# Patient Record
Sex: Female | Born: 1935 | Race: White | Hispanic: No | Smoking: Never smoker
Health system: Southern US, Community
[De-identification: ages and names within clinical notes are randomized; demographics above are authoritative.]

## PROBLEM LIST (undated history)

## (undated) DIAGNOSIS — E119 Type 2 diabetes mellitus without complications: Secondary | ICD-10-CM

## (undated) DIAGNOSIS — S72009A Fracture of unspecified part of neck of unspecified femur, initial encounter for closed fracture: Secondary | ICD-10-CM

## (undated) DIAGNOSIS — G039 Meningitis, unspecified: Secondary | ICD-10-CM

## (undated) DIAGNOSIS — C50919 Malignant neoplasm of unspecified site of unspecified female breast: Secondary | ICD-10-CM

## (undated) HISTORY — PX: HIP CLOSED REDUCTION: SHX983

## (undated) HISTORY — DX: Fracture of unspecified part of neck of unspecified femur, initial encounter for closed fracture: S72.009A

## (undated) HISTORY — DX: Malignant neoplasm of unspecified site of unspecified female breast: C50.919

## (undated) HISTORY — DX: Type 2 diabetes mellitus without complications: E11.9

## (undated) HISTORY — DX: Meningitis, unspecified: G03.9

---

## 2008-03-01 ENCOUNTER — Ambulatory Visit: Admission: RE | Admit: 2008-03-01 | Discharge: 2008-05-13 | Payer: Self-pay | Admitting: Radiation Oncology

## 2010-05-11 ENCOUNTER — Ambulatory Visit (HOSPITAL_COMMUNITY)
Admission: RE | Admit: 2010-05-11 | Discharge: 2010-05-11 | Payer: Self-pay | Source: Home / Self Care | Admitting: Ophthalmology

## 2010-05-25 ENCOUNTER — Ambulatory Visit (HOSPITAL_COMMUNITY): Admission: RE | Admit: 2010-05-25 | Discharge: 2010-05-25 | Payer: Self-pay | Admitting: Ophthalmology

## 2010-11-09 LAB — BASIC METABOLIC PANEL
BUN: 16 mg/dL (ref 6–23)
CO2: 28 mEq/L (ref 19–32)
Calcium: 8.9 mg/dL (ref 8.4–10.5)
Chloride: 102 mEq/L (ref 96–112)
Creatinine, Ser: 1.07 mg/dL (ref 0.4–1.2)
GFR calc Af Amer: 60 mL/min — ABNORMAL LOW (ref >60)
GFR calc non Af Amer: 49 mL/min — ABNORMAL LOW (ref >60)
Glucose, Bld: 97 mg/dL (ref 70–99)
Potassium: 4.2 mEq/L (ref 3.5–5.1)
Sodium: 139 mEq/L (ref 135–145)

## 2010-11-09 LAB — HEMOGLOBIN AND HEMATOCRIT, BLOOD: Hemoglobin: 10.7 g/dL — ABNORMAL LOW (ref 12.0–15.0)

## 2010-11-09 LAB — GLUCOSE, CAPILLARY
Glucose-Capillary: 116 mg/dL — ABNORMAL HIGH (ref 70–99)
Glucose-Capillary: 149 mg/dL — ABNORMAL HIGH (ref 70–99)

## 2015-10-24 DIAGNOSIS — Z79899 Other long term (current) drug therapy: Secondary | ICD-10-CM | POA: Diagnosis not present

## 2015-10-24 DIAGNOSIS — D511 Vitamin B12 deficiency anemia due to selective vitamin B12 malabsorption with proteinuria: Secondary | ICD-10-CM | POA: Diagnosis not present

## 2015-10-24 DIAGNOSIS — I10 Essential (primary) hypertension: Secondary | ICD-10-CM | POA: Diagnosis not present

## 2015-10-24 DIAGNOSIS — E119 Type 2 diabetes mellitus without complications: Secondary | ICD-10-CM | POA: Diagnosis not present

## 2015-10-24 DIAGNOSIS — R531 Weakness: Secondary | ICD-10-CM | POA: Diagnosis not present

## 2015-12-12 DIAGNOSIS — D511 Vitamin B12 deficiency anemia due to selective vitamin B12 malabsorption with proteinuria: Secondary | ICD-10-CM | POA: Diagnosis not present

## 2015-12-12 DIAGNOSIS — J45909 Unspecified asthma, uncomplicated: Secondary | ICD-10-CM | POA: Diagnosis not present

## 2015-12-12 DIAGNOSIS — E119 Type 2 diabetes mellitus without complications: Secondary | ICD-10-CM | POA: Diagnosis not present

## 2015-12-27 DIAGNOSIS — R531 Weakness: Secondary | ICD-10-CM | POA: Diagnosis not present

## 2015-12-27 DIAGNOSIS — D511 Vitamin B12 deficiency anemia due to selective vitamin B12 malabsorption with proteinuria: Secondary | ICD-10-CM | POA: Diagnosis not present

## 2015-12-27 DIAGNOSIS — I10 Essential (primary) hypertension: Secondary | ICD-10-CM | POA: Diagnosis not present

## 2015-12-27 DIAGNOSIS — E119 Type 2 diabetes mellitus without complications: Secondary | ICD-10-CM | POA: Diagnosis not present

## 2015-12-27 DIAGNOSIS — R5381 Other malaise: Secondary | ICD-10-CM | POA: Diagnosis not present

## 2015-12-27 DIAGNOSIS — Z79899 Other long term (current) drug therapy: Secondary | ICD-10-CM | POA: Diagnosis not present

## 2016-01-10 DIAGNOSIS — R5381 Other malaise: Secondary | ICD-10-CM | POA: Diagnosis not present

## 2016-01-10 DIAGNOSIS — R1030 Lower abdominal pain, unspecified: Secondary | ICD-10-CM | POA: Diagnosis not present

## 2016-01-10 DIAGNOSIS — E119 Type 2 diabetes mellitus without complications: Secondary | ICD-10-CM | POA: Diagnosis not present

## 2016-01-10 DIAGNOSIS — N390 Urinary tract infection, site not specified: Secondary | ICD-10-CM | POA: Diagnosis not present

## 2016-01-10 DIAGNOSIS — R531 Weakness: Secondary | ICD-10-CM | POA: Diagnosis not present

## 2016-01-25 DIAGNOSIS — I10 Essential (primary) hypertension: Secondary | ICD-10-CM | POA: Diagnosis not present

## 2016-01-25 DIAGNOSIS — E1169 Type 2 diabetes mellitus with other specified complication: Secondary | ICD-10-CM | POA: Diagnosis not present

## 2016-02-23 DIAGNOSIS — Z79899 Other long term (current) drug therapy: Secondary | ICD-10-CM | POA: Diagnosis not present

## 2016-02-23 DIAGNOSIS — R531 Weakness: Secondary | ICD-10-CM | POA: Diagnosis not present

## 2016-02-23 DIAGNOSIS — E119 Type 2 diabetes mellitus without complications: Secondary | ICD-10-CM | POA: Diagnosis not present

## 2016-02-23 DIAGNOSIS — D513 Other dietary vitamin B12 deficiency anemia: Secondary | ICD-10-CM | POA: Diagnosis not present

## 2016-02-23 DIAGNOSIS — R5381 Other malaise: Secondary | ICD-10-CM | POA: Diagnosis not present

## 2016-02-23 DIAGNOSIS — I10 Essential (primary) hypertension: Secondary | ICD-10-CM | POA: Diagnosis not present

## 2016-04-23 DIAGNOSIS — D518 Other vitamin B12 deficiency anemias: Secondary | ICD-10-CM | POA: Diagnosis not present

## 2016-04-23 DIAGNOSIS — E1169 Type 2 diabetes mellitus with other specified complication: Secondary | ICD-10-CM | POA: Diagnosis not present

## 2016-04-23 DIAGNOSIS — I10 Essential (primary) hypertension: Secondary | ICD-10-CM | POA: Diagnosis not present

## 2016-06-21 DIAGNOSIS — R531 Weakness: Secondary | ICD-10-CM | POA: Diagnosis not present

## 2016-06-21 DIAGNOSIS — R5381 Other malaise: Secondary | ICD-10-CM | POA: Diagnosis not present

## 2016-06-21 DIAGNOSIS — E119 Type 2 diabetes mellitus without complications: Secondary | ICD-10-CM | POA: Diagnosis not present

## 2016-06-21 DIAGNOSIS — I10 Essential (primary) hypertension: Secondary | ICD-10-CM | POA: Diagnosis not present

## 2016-06-21 DIAGNOSIS — N390 Urinary tract infection, site not specified: Secondary | ICD-10-CM | POA: Diagnosis not present

## 2016-06-21 DIAGNOSIS — D510 Vitamin B12 deficiency anemia due to intrinsic factor deficiency: Secondary | ICD-10-CM | POA: Diagnosis not present

## 2016-06-21 DIAGNOSIS — Z79899 Other long term (current) drug therapy: Secondary | ICD-10-CM | POA: Diagnosis not present

## 2016-06-21 DIAGNOSIS — E1169 Type 2 diabetes mellitus with other specified complication: Secondary | ICD-10-CM | POA: Diagnosis not present

## 2016-07-26 DIAGNOSIS — I10 Essential (primary) hypertension: Secondary | ICD-10-CM | POA: Diagnosis not present

## 2016-07-26 DIAGNOSIS — M25562 Pain in left knee: Secondary | ICD-10-CM | POA: Diagnosis not present

## 2016-07-26 DIAGNOSIS — E119 Type 2 diabetes mellitus without complications: Secondary | ICD-10-CM | POA: Diagnosis not present

## 2016-07-26 DIAGNOSIS — D510 Vitamin B12 deficiency anemia due to intrinsic factor deficiency: Secondary | ICD-10-CM | POA: Diagnosis not present

## 2016-09-05 ENCOUNTER — Encounter (INDEPENDENT_AMBULATORY_CARE_PROVIDER_SITE_OTHER): Payer: Self-pay

## 2016-09-05 ENCOUNTER — Ambulatory Visit (INDEPENDENT_AMBULATORY_CARE_PROVIDER_SITE_OTHER): Payer: Medicare Other | Admitting: Pediatrics

## 2016-09-05 ENCOUNTER — Encounter: Payer: Self-pay | Admitting: Pediatrics

## 2016-09-05 VITALS — BP 142/56 | HR 65 | Temp 97.4°F | Ht 65.0 in | Wt 187.4 lb

## 2016-09-05 DIAGNOSIS — M8000XD Age-related osteoporosis with current pathological fracture, unspecified site, subsequent encounter for fracture with routine healing: Secondary | ICD-10-CM | POA: Diagnosis not present

## 2016-09-05 DIAGNOSIS — I10 Essential (primary) hypertension: Secondary | ICD-10-CM | POA: Diagnosis not present

## 2016-09-05 DIAGNOSIS — E785 Hyperlipidemia, unspecified: Secondary | ICD-10-CM | POA: Diagnosis not present

## 2016-09-05 DIAGNOSIS — K5900 Constipation, unspecified: Secondary | ICD-10-CM

## 2016-09-05 DIAGNOSIS — K219 Gastro-esophageal reflux disease without esophagitis: Secondary | ICD-10-CM | POA: Insufficient documentation

## 2016-09-05 DIAGNOSIS — E119 Type 2 diabetes mellitus without complications: Secondary | ICD-10-CM

## 2016-09-05 DIAGNOSIS — M810 Age-related osteoporosis without current pathological fracture: Secondary | ICD-10-CM | POA: Insufficient documentation

## 2016-09-05 LAB — BAYER DCA HB A1C WAIVED: HB A1C: 9.4 % — AB (ref <7.0)

## 2016-09-05 NOTE — Patient Instructions (Signed)
Stop pantoprazole. This is a medication for acid reflux. Let me know if you have stomach pain or chest burning, we may need to restart it but if you don't need it I would like to get you off of it.  We will check with your pharmacy about doses of your medications.

## 2016-09-05 NOTE — Progress Notes (Signed)
Subjective:   Patient ID: Gabriella Ferguson, female    DOB: 01/16/1929, 83 y.o.   MRN: [id number] CC: New Patient (Initial Visit) f/u med problems  HPI: Gabriella Ferguson is a 83 y.o. female presenting for New Patient (Initial Visit)  DM2: doesn't BGLs check at home Not avoiding sugar much, says she eats what she wants but tries to be reasonable  Due for eye exam  Hip fracture: happened about a year ago, continues to have a little bit of burning in hip from surgery On gabapentin Uses a cane because still feels ff balance at times  HTN: doesn't check at home Takes BP meds regularly No chest pain, no SOB  No MI or CVA hx  GER: on PPI, pt says she doesn't know why, denies any abd pain or reflux problems recently Stays constipated Not taking anything recently Takes a stool softener Last stool yesterday, was normal, no pain, goes every other day Drinks a lot of water  Two daughters live nearby, one does a weekly medication pill box for her Lives alone in apt complex Doesn't know doses of any of her meds  Past Medical History:  Diagnosis Date  . Diabetes mellitus without complication (HCC)    Type 2  . Hip fracture ([place])   . Meningitis   meningitis over twenty years ago  Family History  Problem Relation Age of Onset  . Diabetes Daughter    Social History   Social History  . Marital status: Widowed    Spouse name: N/A  . Number of children: N/A  . Years of education: N/A   Social History Main Topics  . Smoking status: Never Smoker  . Smokeless tobacco: Never Used  . Alcohol use No  . Drug use: No  . Sexual activity: Not Currently   Other Topics Concern  . None   Social History Narrative  . None   ROS: All systems negative other than what is in HPI  Objective:    BP (!) 142/56   Pulse 65   Temp 97.4 F (36.3 C) (Oral)   Ht _0  (1.651 m)   Wt 187 lb 6.4 oz (85 kg)   BMI 31.18 kg/m   Wt Readings from Last 3 Encounters:  09/05/16 187 lb 6.4 oz  (85 kg)    Gen: NAD, alert, cooperative with exam, NCAT EYES: EOMI, no conjunctival injection, or no icterus CV: NRRR, normal Q1/J9, I/VI systolic ejection murmur at the sternal borders Resp: CTABL, no wheezes, normal WOB Abd: +BS, soft, NTND. no guarding or organomegaly Ext: trace to 1+ pitting edema ankles, warm Neuro: Alert and oriented, strength equal b/l UE and LE, coordination grossly normal MSK: normal muscle bulk  Assessment & Plan:  [place] was seen today for new patient (initial visit) and med problem follow up. No prior records available.  Diagnoses and all orders for this visit:  Hyperlipidemia, unspecified hyperlipidemia type Cont simvastatin for now No h/o CVA, MI, CAD as far as pt is aware -     Lipid panel  Essential hypertension Cont HCTZ, amlodipine, will call pharmacy to get doses -     CMP14+EGFR  Type 2 diabetes mellitus without complication, without long-term current use of insulin ([place]) Cont PO meds, pt says she cant take BGLs at home, cant give herself insulin, has been proposed by previous doctor. On metformin, januvia, glimeperide, will check with pharmacy about doses -     CBC with Differential/Platelet -     [facility]  Hb A1c Waived  Gastroesophageal reflux disease, esophagitis presence not specified No symptoms Has been on PPI for long time per pt Will try stopping, see if symptoms return  Constipation, unspecified constipation type Not taking anything now, drink plenty of fluids Daily stool softener  Osteoporosis with current pathological fracture with routine healing, unspecified osteoporosis type, subsequent encounter Fragility fracture a year ago Not on osteoporosis treatment If Cr normal, will start alendronate qweek  Follow up plan: Return in about 4 weeks (around 10/03/2016) for med problem follow up. Assunta Found, MD [place]

## 2016-09-06 ENCOUNTER — Telehealth: Payer: Self-pay | Admitting: Pediatrics

## 2016-09-06 LAB — CMP14+EGFR
ALK PHOS: 57 IU/L (ref 39–117)
ALT: 11 IU/L (ref 0–32)
AST: 18 IU/L (ref 0–40)
Albumin/Globulin Ratio: 1.6 (ref 1.2–2.2)
Albumin: 4.1 g/dL (ref 3.5–4.7)
BUN/Creatinine Ratio: 14 (ref 12–28)
BUN: 15 mg/dL (ref 8–27)
Bilirubin Total: 0.3 mg/dL (ref 0.0–1.2)
CHLORIDE: 100 mmol/L (ref 96–106)
CO2: 26 mmol/L (ref 18–29)
CREATININE: 1.06 mg/dL — AB (ref 0.57–1.00)
Calcium: 9.4 mg/dL (ref 8.7–10.3)
GFR calc Af Amer: 55 mL/min/{1.73_m2} — ABNORMAL LOW (ref >59)
GFR calc non Af Amer: 48 mL/min/{1.73_m2} — ABNORMAL LOW (ref >59)
GLOBULIN, TOTAL: 2.5 g/dL (ref 1.5–4.5)
GLUCOSE: 166 mg/dL — AB (ref 65–99)
Potassium: 4.7 mmol/L (ref 3.5–5.2)
SODIUM: 143 mmol/L (ref 134–144)
Total Protein: 6.6 g/dL (ref 6.0–8.5)

## 2016-09-06 LAB — CBC WITH DIFFERENTIAL/PLATELET
BASOS ABS: 0 10*3/uL (ref 0.0–0.2)
Basos: 0 %
EOS (ABSOLUTE): 0.1 10*3/uL (ref 0.0–0.4)
EOS: 2 %
HEMATOCRIT: 32.6 % — AB (ref 34.0–46.6)
Hemoglobin: 10 g/dL — ABNORMAL LOW (ref 11.1–15.9)
IMMATURE GRANULOCYTES: 0 %
Immature Grans (Abs): 0 10*3/uL (ref 0.0–0.1)
Lymphocytes Absolute: 2.5 10*3/uL (ref 0.7–3.1)
Lymphs: 31 %
MCH: 25.2 pg — ABNORMAL LOW (ref 26.6–33.0)
MCHC: 30.7 g/dL — ABNORMAL LOW (ref 31.5–35.7)
MCV: 82 fL (ref 79–97)
MONOS ABS: 0.5 10*3/uL (ref 0.1–0.9)
Monocytes: 7 %
NEUTROS PCT: 60 %
Neutrophils Absolute: 4.8 10*3/uL (ref 1.4–7.0)
PLATELETS: 272 10*3/uL (ref 150–379)
RBC: 3.97 x10E6/uL (ref 3.77–5.28)
RDW: 15.4 % (ref 12.3–15.4)
WBC: 7.9 10*3/uL (ref 3.4–10.8)

## 2016-09-06 LAB — LIPID PANEL
CHOLESTEROL TOTAL: 142 mg/dL (ref 100–199)
Chol/HDL Ratio: 2.7 ratio units (ref 0.0–4.4)
HDL: 52 mg/dL (ref >39)
LDL Calculated: 59 mg/dL (ref 0–99)
TRIGLYCERIDES: 156 mg/dL — AB (ref 0–149)
VLDL Cholesterol Cal: 31 mg/dL (ref 5–40)

## 2016-09-06 MED ORDER — ALENDRONATE SODIUM 70 MG PO TABS: 70.0000 mg | ORAL_TABLET | ORAL | 3 refills | Status: DC

## 2016-09-06 NOTE — Telephone Encounter (Signed)
Can you send in alendronate 70mg  take once weekly #4 tabs with 3 refills?

## 2016-09-10 DIAGNOSIS — E119 Type 2 diabetes mellitus without complications: Secondary | ICD-10-CM | POA: Diagnosis not present

## 2016-09-10 DIAGNOSIS — E785 Hyperlipidemia, unspecified: Secondary | ICD-10-CM | POA: Diagnosis not present

## 2016-09-10 DIAGNOSIS — I10 Essential (primary) hypertension: Secondary | ICD-10-CM | POA: Diagnosis not present

## 2016-09-11 DIAGNOSIS — E119 Type 2 diabetes mellitus without complications: Secondary | ICD-10-CM | POA: Diagnosis not present

## 2016-10-03 ENCOUNTER — Ambulatory Visit (INDEPENDENT_AMBULATORY_CARE_PROVIDER_SITE_OTHER): Payer: Medicare Other | Admitting: Pediatrics

## 2016-10-03 ENCOUNTER — Encounter: Payer: Self-pay | Admitting: Pediatrics

## 2016-10-03 VITALS — BP 135/58 | HR 76 | Temp 97.6°F | Ht 65.0 in | Wt 185.0 lb

## 2016-10-03 DIAGNOSIS — M8000XD Age-related osteoporosis with current pathological fracture, unspecified site, subsequent encounter for fracture with routine healing: Secondary | ICD-10-CM | POA: Diagnosis not present

## 2016-10-03 DIAGNOSIS — K5900 Constipation, unspecified: Secondary | ICD-10-CM

## 2016-10-03 DIAGNOSIS — R350 Frequency of micturition: Secondary | ICD-10-CM

## 2016-10-03 DIAGNOSIS — E785 Hyperlipidemia, unspecified: Secondary | ICD-10-CM | POA: Diagnosis not present

## 2016-10-03 DIAGNOSIS — I10 Essential (primary) hypertension: Secondary | ICD-10-CM | POA: Diagnosis not present

## 2016-10-03 DIAGNOSIS — D649 Anemia, unspecified: Secondary | ICD-10-CM | POA: Diagnosis not present

## 2016-10-03 DIAGNOSIS — E119 Type 2 diabetes mellitus without complications: Secondary | ICD-10-CM

## 2016-10-03 DIAGNOSIS — K219 Gastro-esophageal reflux disease without esophagitis: Secondary | ICD-10-CM | POA: Diagnosis not present

## 2016-10-03 LAB — CBC WITH DIFFERENTIAL/PLATELET
BASOS ABS: 0 10*3/uL (ref 0.0–0.2)
Basos: 0 %
EOS (ABSOLUTE): 0.2 10*3/uL (ref 0.0–0.4)
Eos: 2 %
Hematocrit: 34.3 % (ref 34.0–46.6)
Hemoglobin: 10.7 g/dL — ABNORMAL LOW (ref 11.1–15.9)
IMMATURE GRANS (ABS): 0 10*3/uL (ref 0.0–0.1)
IMMATURE GRANULOCYTES: 0 %
LYMPHS: 26 %
Lymphocytes Absolute: 2.5 10*3/uL (ref 0.7–3.1)
MCH: 25.6 pg — AB (ref 26.6–33.0)
MCHC: 31.2 g/dL — ABNORMAL LOW (ref 31.5–35.7)
MCV: 82 fL (ref 79–97)
Monocytes Absolute: 0.7 10*3/uL (ref 0.1–0.9)
Monocytes: 7 %
NEUTROS PCT: 65 %
Neutrophils Absolute: 6.1 10*3/uL (ref 1.4–7.0)
PLATELETS: 276 10*3/uL (ref 150–379)
RBC: 4.18 x10E6/uL (ref 3.77–5.28)
RDW: 15.4 % (ref 12.3–15.4)
WBC: 9.5 10*3/uL (ref 3.4–10.8)

## 2016-10-03 LAB — BAYER DCA HB A1C WAIVED: HB A1C: 9.3 % — AB (ref <7.0)

## 2016-10-03 MED ORDER — HYDROCHLOROTHIAZIDE 12.5 MG PO CAPS: 12.5000 mg | ORAL_CAPSULE | Freq: Every day | ORAL | 3 refills | Status: DC

## 2016-10-03 NOTE — Patient Instructions (Signed)
Stop amlodipine.

## 2016-10-03 NOTE — Progress Notes (Signed)
  Subjective:   Patient ID: Gabriella Ferguson, female    DOB: August 06, 1929, 83 y.o.   MRN: [id number] CC: 4 week recheck  HPI: Gabriella Ferguson is a 83 y.o. female presenting for 4 week recheck  A couple of weeks of increased urinary frequency, not all the time Drinks water regularly throughout the day Now havign to get up at night 1-2 times to urinate No dysuria, urgency No accidents  DM2: says she is taking pills regularly Eats 3-4 hershey kisses a day Cannot check her BGLs at home because of fear of needles Says she will never be able to be on insulin because of needle fear  Sometimes feels "wobbly", she thinks due to pain in hip from remote fracture B/l knees bother her regularly with arthritis Was getting steroid injections in the past, helped sometimes Now aspercream doing the most to help   Relevant past medical, surgical, family and social history reviewed. Allergies and medications reviewed and updated. History  Smoking Status  . Never Smoker  Smokeless Tobacco  . Never Used   ROS: Per HPI   Objective:    BP (!) 135/58   Pulse 76   Temp 97.6 F (36.4 C) (Oral)   Ht 5\' 5"  (1.651 m)   Wt 185 lb (83.9 kg)   BMI 30.79 kg/m   Wt Readings from Last 3 Encounters:  10/03/16 185 lb (83.9 kg)  09/05/16 187 lb 6.4 oz (85 kg)    Gen: NAD, alert, cooperative with exam, NCAT EYES: EOMI, no conjunctival injection, or no icterus CV: NRRR, normal S1/S2, no murmur, distal pulses 2+ b/l Resp: CTABL, no wheezes, normal WOB Abd: +BS, soft, NTND. no guarding or organomegaly Ext: No edema, warm Neuro: Alert and oriented, strength equal b/l UE and LE, coordination grossly normal MSK: normal muscle bulk  Assessment & Plan:  Gabriella Ferguson was seen today for 4 week recheck.  Diagnoses and all orders for this visit:  Type 2 diabetes mellitus without complication, without long-term current use of insulin (HCC) A1c elevated, 9.3 Eating sweets regularly Pt very against insulin On  SU, metformin, januvia now Increase amaryl to 6mg  Decrease sugar intake -     Bayer DCA Hb A1c Waived  Low hemoglobin Recheck CBC -     CBC with Differential/Platelet  Essential hypertension well controlled Occasionally wobbly when stands On 2.5mg  amlodipine, 12.5mg  HCTz now Stop amlodipine Check BPs at home when able Will recheck next visit here -     hydrochlorothiazide (MICROZIDE) 12.5 MG capsule; Take 1 capsule (12.5 mg total) by mouth daily.  Osteoporosis with current pathological fracture with routine healing, unspecified osteoporosis type, subsequent encounter Cont alendronate  Constipation, unspecified constipation type Regular stools  Gastroesophageal reflux disease, esophagitis presence not specified Stopped PPI after last visit, no return of symptoms  Hyperlipidemia, unspecified hyperlipidemia type Cont simvastatin   Urinary frequency UA unremarkable, will f/u cx -     Urine culture -     Urinalysis, Complete  Follow up plan: Return in about 3 months (around 12/31/2016) for diabetes follow up. Assunta Found, MD [place]

## 2016-10-09 ENCOUNTER — Other Ambulatory Visit: Payer: Self-pay

## 2016-10-09 MED ORDER — METFORMIN HCL 1000 MG PO TABS: 1000.0000 mg | ORAL_TABLET | Freq: Two times a day (BID) | ORAL | 2 refills | Status: DC

## 2016-10-09 MED ORDER — GLIMEPIRIDE 4 MG PO TABS: 4.0000 mg | ORAL_TABLET | Freq: Every day | ORAL | 2 refills | Status: DC

## 2016-10-17 ENCOUNTER — Other Ambulatory Visit: Payer: Self-pay | Admitting: *Deleted

## 2016-10-17 MED ORDER — GABAPENTIN 300 MG PO CAPS: 300.0000 mg | ORAL_CAPSULE | Freq: Two times a day (BID) | ORAL | 1 refills | Status: DC

## 2016-10-17 NOTE — Telephone Encounter (Signed)
Pharmacy is requesting Januvia 50mg  BID and we have it down as patient taking daily. Please review

## 2016-10-17 NOTE — Addendum Note (Signed)
Addended by: Thana Ates on: 10/17/2016 08:53 AM   Modules accepted: Orders

## 2016-10-18 ENCOUNTER — Other Ambulatory Visit: Payer: Self-pay | Admitting: *Deleted

## 2016-10-18 MED ORDER — GABAPENTIN 300 MG PO CAPS: 300.0000 mg | ORAL_CAPSULE | Freq: Two times a day (BID) | ORAL | 1 refills | Status: DC

## 2016-10-18 MED ORDER — SITAGLIPTIN PHOSPHATE 50 MG PO TABS: 50.0000 mg | ORAL_TABLET | Freq: Every day | ORAL | 1 refills | Status: DC

## 2016-12-14 DIAGNOSIS — Z029 Encounter for administrative examinations, unspecified: Secondary | ICD-10-CM

## 2016-12-17 ENCOUNTER — Other Ambulatory Visit: Payer: Self-pay | Admitting: Pediatrics

## 2016-12-19 ENCOUNTER — Other Ambulatory Visit: Payer: Self-pay | Admitting: *Deleted

## 2016-12-19 MED ORDER — SIMVASTATIN 20 MG PO TABS
20.0000 mg | ORAL_TABLET | Freq: Every day | ORAL | 0 refills | Status: DC
Start: 2016-12-19 — End: 2017-01-16

## 2016-12-31 ENCOUNTER — Ambulatory Visit (INDEPENDENT_AMBULATORY_CARE_PROVIDER_SITE_OTHER): Payer: Medicare Other | Admitting: Pediatrics

## 2016-12-31 ENCOUNTER — Encounter: Payer: Self-pay | Admitting: Pediatrics

## 2016-12-31 VITALS — BP 131/69 | HR 86 | Temp 99.6°F | Ht 65.0 in | Wt 184.4 lb

## 2016-12-31 DIAGNOSIS — N3090 Cystitis, unspecified without hematuria: Secondary | ICD-10-CM

## 2016-12-31 DIAGNOSIS — R399 Unspecified symptoms and signs involving the genitourinary system: Secondary | ICD-10-CM

## 2016-12-31 DIAGNOSIS — K219 Gastro-esophageal reflux disease without esophagitis: Secondary | ICD-10-CM

## 2016-12-31 DIAGNOSIS — E119 Type 2 diabetes mellitus without complications: Secondary | ICD-10-CM | POA: Diagnosis not present

## 2016-12-31 DIAGNOSIS — I10 Essential (primary) hypertension: Secondary | ICD-10-CM

## 2016-12-31 DIAGNOSIS — Z23 Encounter for immunization: Secondary | ICD-10-CM | POA: Diagnosis not present

## 2016-12-31 LAB — URINALYSIS, COMPLETE
BILIRUBIN UA: NEGATIVE
NITRITE UA: NEGATIVE
PH UA: 5 (ref 5.0–7.5)
Specific Gravity, UA: 1.025 (ref 1.005–1.030)
UUROB: 0.2 mg/dL (ref 0.2–1.0)

## 2016-12-31 LAB — MICROSCOPIC EXAMINATION: RBC MICROSCOPIC, UA: NONE SEEN /HPF (ref 0–?)

## 2016-12-31 LAB — BAYER DCA HB A1C WAIVED: HB A1C (BAYER DCA - WAIVED): 10.9 % — ABNORMAL HIGH (ref <7.0)

## 2016-12-31 MED ORDER — SULFAMETHOXAZOLE-TRIMETHOPRIM 800-160 MG PO TABS: 1.0000 | ORAL_TABLET | Freq: Two times a day (BID) | ORAL | 0 refills | Status: AC

## 2016-12-31 NOTE — Patient Instructions (Signed)
Decrease sugar intake Come back for appointment see Tammy to talk about diabetes in the next 2 weeks

## 2016-12-31 NOTE — Progress Notes (Signed)
  Subjective:   Patient ID: Gabriella Ferguson, female    DOB: 1929/07/15, 83 y.o.   MRN: [id number] CC: Follow-up (3 month) and Burning with urination  HPI: Gabriella Ferguson is a 83 y.o. female presenting for Follow-up (3 month) and Burning with urination  Dysuria for the last day with each voiding No fevers No belly pain  DM2:  Eating hersheys kisses, cookies most every day Eating three meals a day Eating sweet tart with jelly and white icing every morning for breakfast, usually doesn't eat all of the icing, says she throws most of it away Cooks a lot of cabbage and cornbread for lunch Eats popcorn a lot for dinner, not sweet pop corn  Neuropathy: denies any feeling of tingling/numbness in her feet Sometimes they feel sore Normal foot exam today  Relevant past medical, surgical, family and social history reviewed. Allergies and medications reviewed and updated. History  Smoking Status  . Never Smoker  Smokeless Tobacco  . Never Used   ROS: Per HPI   Objective:    BP 131/69   Pulse 86   Temp 99.6 F (37.6 C) (Oral)   Ht 5\' 5"  (1.651 m)   Wt 184 lb 6.4 oz (83.6 kg)   BMI 30.69 kg/m   Wt Readings from Last 3 Encounters:  12/31/16 184 lb 6.4 oz (83.6 kg)  10/03/16 185 lb (83.9 kg)  09/05/16 187 lb 6.4 oz (85 kg)    Gen: NAD, alert, cooperative with exam, NCAT EYES: EOMI, no conjunctival injection, or no icterus ENT: OP without erythema LYMPH: no cervical LAD CV: NRRR, normal S1/S2 Resp: CTABL, no wheezes, normal WOB Abd: +BS, soft, NTND. No CVA tenderness Ext: No edema, warm Neuro: Alert and oriented  Assessment & Plan:  Gabriella Ferguson was seen today for follow-up and burning with urination.  Diagnoses and all orders for this visit:  Essential hypertension Stable Cont current meds  Type 2 diabetes mellitus without complication, without long-term current use of insulin (HCC) A1c elevated at 10.9 Eating sweets for breakfast, some candy/cookies throughout the  day On januvia, metformin, glimeperide Decreased eye sight per pt Does not want to check BGLs at home, not able to give self any medicine with needles Pt will work on decreasing sugar intake, f/u with Tammy -     Bayer DCA Hb A1c Waived -     Microalbumin / creatinine urine ratio -     CBC with Differential/Platelet  UTI symptoms -     Urinalysis, Complete -     CBC with Differential/Platelet -     Urine culture  Cystitis UA positive f/u urine culture Start below -     sulfamethoxazole-trimethoprim (BACTRIM DS) 800-160 MG tablet; Take 1 tablet by mouth 2 (two) times daily.  Need for tetanus, diphtheria, and acellular pertussis (Tdap) vaccine -     Tdap vaccine greater than or equal to 7yo IM  Need for vaccination against Streptococcus pneumoniae using pneumococcal conjugate vaccine 13 -     Pneumococcal conjugate vaccine 13-valent  Other orders -     Microscopic Examination   Follow up plan: 3 mo Gabriella Found, MD [place]

## 2017-01-01 LAB — CBC WITH DIFFERENTIAL/PLATELET
BASOS ABS: 0 10*3/uL (ref 0.0–0.2)
Basos: 0 %
EOS (ABSOLUTE): 0.4 10*3/uL (ref 0.0–0.4)
Eos: 5 %
Hematocrit: 34.8 % (ref 34.0–46.6)
Hemoglobin: 10.5 g/dL — ABNORMAL LOW (ref 11.1–15.9)
Immature Grans (Abs): 0 10*3/uL (ref 0.0–0.1)
Immature Granulocytes: 0 %
LYMPHS ABS: 2.3 10*3/uL (ref 0.7–3.1)
Lymphs: 27 %
MCH: 24.8 pg — AB (ref 26.6–33.0)
MCHC: 30.2 g/dL — AB (ref 31.5–35.7)
MCV: 82 fL (ref 79–97)
MONOS ABS: 0.6 10*3/uL (ref 0.1–0.9)
Monocytes: 8 %
Neutrophils Absolute: 5.1 10*3/uL (ref 1.4–7.0)
Neutrophils: 60 %
Platelets: 294 10*3/uL (ref 150–379)
RBC: 4.24 x10E6/uL (ref 3.77–5.28)
RDW: 14.4 % (ref 12.3–15.4)
WBC: 8.6 10*3/uL (ref 3.4–10.8)

## 2017-01-01 LAB — URINE CULTURE

## 2017-01-02 LAB — MICROALBUMIN / CREATININE URINE RATIO

## 2017-01-08 ENCOUNTER — Encounter: Payer: Self-pay | Admitting: Family

## 2017-01-08 ENCOUNTER — Other Ambulatory Visit: Payer: Self-pay | Admitting: Family

## 2017-01-08 ENCOUNTER — Ambulatory Visit (INDEPENDENT_AMBULATORY_CARE_PROVIDER_SITE_OTHER): Payer: Medicare Other | Admitting: Family

## 2017-01-08 ENCOUNTER — Ambulatory Visit (INDEPENDENT_AMBULATORY_CARE_PROVIDER_SITE_OTHER): Payer: Medicare Other | Admitting: Pharmacist

## 2017-01-08 ENCOUNTER — Ambulatory Visit (INDEPENDENT_AMBULATORY_CARE_PROVIDER_SITE_OTHER): Payer: Medicare Other

## 2017-01-08 VITALS — BP 122/47 | HR 71 | Temp 98.0°F | Ht 65.0 in

## 2017-01-08 DIAGNOSIS — M79642 Pain in left hand: Secondary | ICD-10-CM | POA: Diagnosis not present

## 2017-01-08 DIAGNOSIS — W19XXXA Unspecified fall, initial encounter: Secondary | ICD-10-CM

## 2017-01-08 DIAGNOSIS — S62317A Displaced fracture of base of fifth metacarpal bone. left hand, initial encounter for closed fracture: Secondary | ICD-10-CM | POA: Diagnosis not present

## 2017-01-08 DIAGNOSIS — E119 Type 2 diabetes mellitus without complications: Secondary | ICD-10-CM

## 2017-01-08 NOTE — Progress Notes (Addendum)
Patient ID: Gabriella Ferguson, female   DOB: 1929/02/14, 83 y.o.   MRN: [id number]   Subjective:   Patient was scheduled to discuss DM and diet today.  However she had fall yesterday and has swollen, bruised left hand.  Triaged to provider for evaluation.   I did place CGM today and made appt to follow up in 2 weeks.  Cherre Robins, PharmD, CPP, CDE  CGM was not placed as patient was determined to have displaced / fractured metatarsal and we were not sure if ortho would require any further imaging studies which might require removal of CGM.

## 2017-01-08 NOTE — Progress Notes (Signed)
   Subjective:    Patient ID: Gabriella Ferguson, female    DOB: [birth year], 83 y.o.   MRN: [id number]  Fall  The accident occurred 2 days ago. The fall occurred while standing. She fell from a height of 3 to 5 ft. She landed on carpet. There was no blood loss. The point of impact was the right knee and left knee (left hand,). The pain is present in the left hand. The pain is mild. The symptoms are aggravated by ambulation and movement. Pertinent negatives include no bowel incontinence, fever, headaches, hematuria, loss of consciousness, numbness, tingling or visual change. She has tried acetaminophen and NSAID for the symptoms. The treatment provided mild relief.      Review of Systems  Constitutional: Negative for fever.  Gastrointestinal: Negative for bowel incontinence.  Genitourinary: Negative for hematuria.  Musculoskeletal: Positive for arthralgias.  Neurological: Negative for tingling, loss of consciousness, numbness and headaches.  All other systems reviewed and are negative.      Objective:   Physical Exam  Constitutional: She is oriented to person, place, and time. She appears well-developed and well-nourished. No distress.  HENT:  Head: Normocephalic.  Cardiovascular: Normal rate, regular rhythm, normal heart sounds and intact distal pulses.   No murmur heard. Pulmonary/Chest: Effort normal and breath sounds normal. No respiratory distress. She has no wheezes.  Musculoskeletal: Normal range of motion. She exhibits edema and tenderness.  Left lateral hand swelling, ecchymosis, and tenderness present, bilateral tenderness in knees with flexion   Neurological: She is alert and oriented to person, place, and time. She has normal reflexes. No cranial nerve deficit.  Skin: Skin is warm and dry.  Psychiatric: She has a normal mood and affect. Her behavior is normal. Judgment and thought content normal.  Vitals reviewed.  Hand x-ray- Fractured fifth metacarpal Preliminary  reading by [name], FNP [facility]    BP (!) 122/47   Pulse 71   Temp 98 F (36.7 C) (Oral)   Ht 5\' 5"  (1.651 m)      Assessment & Plan:  1. Displaced fracture of base of fifth metacarpal bone, left hand, initial encounter for closed fracture -Ortho app tomorrow Falls precautions discussed Continue tylenol  RTO prn  - Ambulatory referral to [facility], FNP

## 2017-01-08 NOTE — Patient Instructions (Signed)
Metacarpal Fracture A metacarpal fracture is a break (fracture) of a bone in the hand. Metacarpals are the bones that extend from your knuckles to your wrist. In each hand, you have five metacarpal bones that connect your fingers and your thumb to your wrist. Some hand fractures have bone pieces that are close together and stable (simple). These fractures may be treated with only a splint or cast. Hand fractures that have many pieces of broken bone (comminuted), unstable bone pieces (displaced), or a bone that breaks through the skin (compound) usually require surgery. What are the causes? This injury may be caused by:  A fall.  A hard, direct hit to your hand.  An injury that squeezes your knuckle, stretches your finger out of place, or crushes your hand. What increases the risk? This injury is more likely to occur if:  You play contact sports.  You have certain bone diseases. What are the signs or symptoms? Symptoms of this type of fracture develop soon after the injury. Symptoms may include:  Swelling.  Pain.  Stiffness.  Increased pain with movement.  Bruising.  Inability to move a finger.  A shortened finger.  A finger knuckle that looks sunken in.  Unusual appearance of the hand or finger (deformity). How is this diagnosed? This injury may be diagnosed based on your signs and symptoms, especially if you had a recent hand injury. Your health care provider will perform a physical exam. He or she may also order X-rays to confirm the diagnosis. How is this treated? Treatment for this injury depends on the type of fracture you have and how severe it is. Possible treatments include:  Non-reduction. This can be done if the bone does not need to be moved back into place. The fracture can be casted or splinted as it is.  Closed reduction. If your bone is stable and can be moved back into place, you may only need to wear a cast or splint or have buddy taping.  Closed  reduction with internal fixation (CRIF). This is the most common treatment. You may have this procedure if your bone can be moved back into place but needs more support. Wires, pins, or screws may be inserted through your skin to stabilize the fracture.  Open reduction with internal fixation (ORIF). This may be needed if your fracture is severe and unstable. It involves surgery to move your bone back into the right position. Screws, wires, or plates are used to stabilize the fracture. After all procedures, you may need to wear a cast or a splint for several weeks. You will also need to have follow-up X-rays to make sure that the bone is healing well and staying in position. After you no longer need your cast or splint, you may need physical therapy. This will help you to regain full movement and strength in your hand. Follow these instructions at home: If you have a cast:   Do not stick anything inside the cast to scratch your skin. Doing that increases your risk of infection.  Check the skin around the cast every day. Report any concerns to your health care provider. You may put lotion on dry skin around the edges of the cast. Do not apply lotion to the skin underneath the cast. If you have a splint:   Wear it as directed by your health care provider. Remove it only as directed by your health care provider.  Loosen the splint if your fingers become numb and tingle, or if they  turn cold and blue. Bathing   Cover the cast or splint with a watertight plastic bag to protect it from water while you take a bath or a shower. Do not let the cast or splint get wet. Managing pain, stiffness, and swelling   If directed, apply ice to the injured area (if you have a splint, not a cast):  Put ice in a plastic bag.  Place a towel between your skin and the bag.  Leave the ice on for 20 minutes, 2-3 times a day.  Move your fingers often to avoid stiffness and to lessen swelling.  Raise the injured area  above the level of your heart while you are sitting or lying down. Driving   Do not drive or operate heavy machinery while taking pain medicine.  Do not drive while wearing a cast or splint on a hand that you use for driving. Activity   Return to your normal activities as directed by your health care provider. Ask your health care provider what activities are safe for you. General instructions   Do not put pressure on any part of the cast or splint until it is fully hardened. This may take several hours.  Keep the cast or splint clean and dry.  Do not use any tobacco products, including cigarettes, chewing tobacco, or electronic cigarettes. Tobacco can delay bone healing. If you need help quitting, ask your health care provider.  Take medicines only as directed by your health care provider.  Keep all follow-up visits as directed by your health care provider. This is important. Contact a health care provider if:  Your pain is getting worse.  You have redness, swelling, or pain in the injured area.  You have fluid, blood, or pus coming from under your cast or splint.  You notice a bad smell coming from under your cast or splint.  You have a fever. Get help right away if:  You develop a rash.  You have trouble breathing.  Your skin or nails on your injured hand turn blue or gray even after you loosen your splint.  Your injured hand feels cold or becomes numb even after you loosen your splint.  You develop severe pain under the cast or in your hand. This information is not intended to replace advice given to you by your health care provider. Make sure you discuss any questions you have with your health care provider. Document Released: 08/13/2005 Document Revised: 01/19/2016 Document Reviewed: 06/02/2014 Elsevier Interactive Patient Education  2017 [employer].

## 2017-01-09 DIAGNOSIS — S62337A Displaced fracture of neck of fifth metacarpal bone, left hand, initial encounter for closed fracture: Secondary | ICD-10-CM | POA: Diagnosis not present

## 2017-01-09 DIAGNOSIS — M79642 Pain in left hand: Secondary | ICD-10-CM | POA: Diagnosis not present

## 2017-01-16 ENCOUNTER — Other Ambulatory Visit: Payer: Self-pay | Admitting: Pediatrics

## 2017-01-22 ENCOUNTER — Ambulatory Visit: Payer: Self-pay | Admitting: Pharmacist

## 2017-01-23 DIAGNOSIS — S62337D Displaced fracture of neck of fifth metacarpal bone, left hand, subsequent encounter for fracture with routine healing: Secondary | ICD-10-CM | POA: Diagnosis not present

## 2017-02-08 DIAGNOSIS — S62337D Displaced fracture of neck of fifth metacarpal bone, left hand, subsequent encounter for fracture with routine healing: Secondary | ICD-10-CM | POA: Diagnosis not present

## 2017-02-13 ENCOUNTER — Other Ambulatory Visit: Payer: Self-pay | Admitting: Pediatrics

## 2017-02-13 DIAGNOSIS — I10 Essential (primary) hypertension: Secondary | ICD-10-CM

## 2017-02-25 DIAGNOSIS — M79642 Pain in left hand: Secondary | ICD-10-CM | POA: Diagnosis not present

## 2017-03-14 ENCOUNTER — Other Ambulatory Visit: Payer: Self-pay | Admitting: Pediatrics

## 2017-03-18 ENCOUNTER — Encounter: Payer: Self-pay | Admitting: Pediatrics

## 2017-03-18 ENCOUNTER — Ambulatory Visit (INDEPENDENT_AMBULATORY_CARE_PROVIDER_SITE_OTHER): Payer: Medicare Other | Admitting: Pediatrics

## 2017-03-18 VITALS — BP 125/60 | HR 67 | Temp 97.7°F | Ht 65.0 in | Wt 184.8 lb

## 2017-03-18 DIAGNOSIS — N3090 Cystitis, unspecified without hematuria: Secondary | ICD-10-CM | POA: Diagnosis not present

## 2017-03-18 DIAGNOSIS — R399 Unspecified symptoms and signs involving the genitourinary system: Secondary | ICD-10-CM

## 2017-03-18 DIAGNOSIS — N949 Unspecified condition associated with female genital organs and menstrual cycle: Secondary | ICD-10-CM | POA: Diagnosis not present

## 2017-03-18 DIAGNOSIS — L298 Other pruritus: Secondary | ICD-10-CM | POA: Diagnosis not present

## 2017-03-18 DIAGNOSIS — N898 Other specified noninflammatory disorders of vagina: Secondary | ICD-10-CM

## 2017-03-18 MED ORDER — NITROFURANTOIN MONOHYD MACRO 100 MG PO CAPS: 100.0000 mg | ORAL_CAPSULE | Freq: Two times a day (BID) | ORAL | 0 refills | Status: AC

## 2017-03-18 NOTE — Progress Notes (Signed)
  Subjective:   Patient ID: Gabriella Ferguson, female    DOB: May 25, 1929, 83 y.o.   MRN: [id number] CC: Vaginal burning  HPI: Gabriella Ferguson is a 83 y.o. female presenting for Vaginal burning  Some burning with voiding for the last week No fevers Some lower abd pain intermittently No flank pain Normal appetite Having urinary frequency and urgency Not always making to bathroom, has to wear pads Feeling well otherwise No vaginal discharge No h/o yeast infections  Relevant past medical, surgical, family and social history reviewed. Allergies and medications reviewed and updated. History  Smoking Status  . Never Smoker  Smokeless Tobacco  . Never Used   ROS: Per HPI   Objective:    BP 125/60   Pulse 67   Temp 97.7 F (36.5 C) (Oral)   Ht 5\' 5"  (1.651 m)   Wt 184 lb 12.8 oz (83.8 kg)   BMI 30.75 kg/m   Wt Readings from Last 3 Encounters:  03/18/17 184 lb 12.8 oz (83.8 kg)  12/31/16 184 lb 6.4 oz (83.6 kg)  10/03/16 185 lb (83.9 kg)    Gen: NAD, alert, cooperative with exam, NCAT EYES: EOMI, no conjunctival injection, or no icterus ENT:  OP without erythema CV: NRRR, normal S1/S2, no murmur, distal pulses 2+ b/l Resp: CTABL, no wheezes, normal WOB Abd: +BS, soft, NTND. no guarding or organomegaly, no CVA tenderness Ext: No edema, warm Neuro: Alert and oriented  Assessment & Plan:  Gabriella Ferguson was seen today for vaginal burning.  Diagnoses and all orders for this visit:  Vaginal burning -     Urinalysis, Complete -     WET PREP FOR TRICH, YEAST, CLUE  Vaginal itching -     Urinalysis, Complete -     WET PREP FOR TRICH, YEAST, CLUE  UTI symptoms -     Urine Culture  Cystitis +LEUK on dip Follow up urine culture Start below Return precautions discussed. -     nitrofurantoin, macrocrystal-monohydrate, (MACROBID) 100 MG capsule; Take 1 capsule (100 mg total) by mouth 2 (two) times daily.   Follow up plan: As scheduled, has appt in 2.5 weeks for Dm2 follow  up Gabriella Found, MD [place]

## 2017-03-20 LAB — URINALYSIS, COMPLETE
BILIRUBIN UA: NEGATIVE
Glucose, UA: NEGATIVE
KETONES UA: NEGATIVE
NITRITE UA: NEGATIVE
Protein, UA: NEGATIVE
RBC UA: NEGATIVE
SPEC GRAV UA: 1.015 (ref 1.005–1.030)
Urobilinogen, Ur: 0.2 mg/dL (ref 0.2–1.0)
pH, UA: 5.5 (ref 5.0–7.5)

## 2017-03-20 LAB — WET PREP FOR TRICH, YEAST, CLUE
Clue Cell Exam: NEGATIVE
Trichomonas Exam: NEGATIVE

## 2017-03-20 LAB — MICROSCOPIC EXAMINATION

## 2017-03-22 LAB — URINE CULTURE

## 2017-04-03 ENCOUNTER — Encounter: Payer: Self-pay | Admitting: Pediatrics

## 2017-04-03 ENCOUNTER — Ambulatory Visit (INDEPENDENT_AMBULATORY_CARE_PROVIDER_SITE_OTHER): Payer: Medicare Other | Admitting: Pediatrics

## 2017-04-03 VITALS — BP 134/63 | HR 61 | Temp 97.5°F | Ht 65.0 in | Wt 184.6 lb

## 2017-04-03 DIAGNOSIS — R300 Dysuria: Secondary | ICD-10-CM

## 2017-04-03 DIAGNOSIS — E119 Type 2 diabetes mellitus without complications: Secondary | ICD-10-CM

## 2017-04-03 DIAGNOSIS — M810 Age-related osteoporosis without current pathological fracture: Secondary | ICD-10-CM

## 2017-04-03 DIAGNOSIS — I10 Essential (primary) hypertension: Secondary | ICD-10-CM | POA: Diagnosis not present

## 2017-04-03 DIAGNOSIS — E1169 Type 2 diabetes mellitus with other specified complication: Secondary | ICD-10-CM | POA: Diagnosis not present

## 2017-04-03 DIAGNOSIS — E785 Hyperlipidemia, unspecified: Secondary | ICD-10-CM | POA: Diagnosis not present

## 2017-04-03 DIAGNOSIS — G629 Polyneuropathy, unspecified: Secondary | ICD-10-CM

## 2017-04-03 LAB — MICROSCOPIC EXAMINATION
BACTERIA UA: NONE SEEN
RBC, UA: NONE SEEN /hpf (ref 0–?)
Renal Epithel, UA: NONE SEEN /hpf

## 2017-04-03 LAB — URINALYSIS, COMPLETE
Bilirubin, UA: NEGATIVE
Nitrite, UA: NEGATIVE
RBC, UA: NEGATIVE
Specific Gravity, UA: 1.02 (ref 1.005–1.030)
Urobilinogen, Ur: 0.2 mg/dL (ref 0.2–1.0)
pH, UA: 5.5 (ref 5.0–7.5)

## 2017-04-03 LAB — BAYER DCA HB A1C WAIVED: HB A1C (BAYER DCA - WAIVED): 10.4 % — ABNORMAL HIGH (ref <7.0)

## 2017-04-03 MED ORDER — HYDROCHLOROTHIAZIDE 12.5 MG PO CAPS: 12.5000 mg | ORAL_CAPSULE | Freq: Every day | ORAL | 1 refills | Status: DC

## 2017-04-03 MED ORDER — AMOXICILLIN 500 MG PO CAPS: 500.0000 mg | ORAL_CAPSULE | Freq: Two times a day (BID) | ORAL | 0 refills | Status: DC

## 2017-04-03 MED ORDER — GABAPENTIN 300 MG PO CAPS: 300.0000 mg | ORAL_CAPSULE | Freq: Two times a day (BID) | ORAL | 1 refills | Status: DC

## 2017-04-03 MED ORDER — ALENDRONATE SODIUM 70 MG PO TABS
ORAL_TABLET | ORAL | 2 refills | Status: DC
Start: 2017-04-03 — End: 2017-05-13

## 2017-04-03 MED ORDER — SITAGLIPTIN PHOSPHATE 100 MG PO TABS: 100.0000 mg | ORAL_TABLET | Freq: Every day | ORAL | 1 refills | Status: DC

## 2017-04-03 MED ORDER — SIMVASTATIN 20 MG PO TABS: 20.0000 mg | ORAL_TABLET | Freq: Every day | ORAL | 1 refills | Status: DC

## 2017-04-03 MED ORDER — METFORMIN HCL 1000 MG PO TABS: ORAL_TABLET | ORAL | 1 refills | Status: DC

## 2017-04-03 MED ORDER — GLIMEPIRIDE 4 MG PO TABS: 4.0000 mg | ORAL_TABLET | Freq: Every day | ORAL | 1 refills | Status: DC

## 2017-04-03 NOTE — Progress Notes (Signed)
Subjective:   Patient ID: Gabriella Ferguson, female    DOB: [birth year], 83 y.o.   MRN: [id number] CC: Follow-up (3 month) med problems HPI: Gabriella Ferguson is a 83 y.o. female presenting for Follow-up (3 month)  Having frequent urination Also Urgency, frequency Has to wear pads Antibiotic helped some Symptoms worse past few days No fevers Normal appetite  DM2: says she doesn't eat right Eats hersheys kisses, drinks sweet tea  Open to decreasing  HTN: no CP, no SOB  HLD: no s/e, taking med regularly  Relevant past medical, surgical, family and social history reviewed. Allergies and medications reviewed and updated. History  Smoking Status  . Never Smoker  Smokeless Tobacco  . Never Used   ROS: Per HPI   Objective:    BP 134/63   Pulse 61   Temp (!) 97.5 F (36.4 C) (Oral)   Ht 5\' 5"  (1.651 m)   Wt 184 lb 9.6 oz (83.7 kg)   BMI 30.72 kg/m   Wt Readings from Last 3 Encounters:  04/03/17 184 lb 9.6 oz (83.7 kg)  03/18/17 184 lb 12.8 oz (83.8 kg)  12/31/16 184 lb 6.4 oz (83.6 kg)    Gen: NAD, alert, cooperative with exam, NCAT EYES: EOMI, no conjunctival injection, or no icterus ENT:   OP without erythema LYMPH: no cervical LAD CV: NRRR, normal S1/S2, no murmur, distal pulses 2+ b/l Resp: CTABL, no wheezes, normal WOB Abd: +BS, soft, NTND. No CVA tenderenss Ext: No edema, warm Neuro: Alert and oriented, strength equal b/l UE and LE, coordination grossly normal MSK: normal muscle bulk  Assessment & Plan:  Gabriella Ferguson was seen today for follow-up.  Diagnoses and all orders for this visit:  Type 2 diabetes mellitus without complication, without long-term current use of insulin (HCC) A1c 10.4, slightly improved from 10.9 Continues sugary foods Will increase januvia Pt open to decreasing sugar intake -     Bayer DCA Hb A1c Waived -     glimepiride (AMARYL) 4 MG tablet; Take 1 tablet (4 mg total) by mouth daily. -     sitaGLIPtin (JANUVIA) 100 MG tablet; Take  1 tablet (100 mg total) by mouth daily. -     metFORMIN (GLUCOPHAGE) 1000 MG tablet; TAKE  (1)  TABLET TWICE A DAY WITH MEALS (BREAKFAST AND SUPPER)  Essential hypertension Adequate control, cont current meds -     hydrochlorothiazide (MICROZIDE) 12.5 MG capsule; Take 1 capsule (12.5 mg total) by mouth daily. -     Basic Metabolic Panel  Neuropathy Stable, cont below -     gabapentin (NEURONTIN) 300 MG capsule; Take 1 capsule (300 mg total) by mouth 2 (two) times daily.  Osteoporosis, unspecified osteoporosis type, unspecified pathological fracture presence Stable, cont below -     alendronate (FOSAMAX) 70 MG tablet; Take 1 tablet by mouth once a week with a full glass of water on empty stomach.  Hyperlipidemia associated with type 2 diabetes mellitus (HCC) -     simvastatin (ZOCOR) 20 MG tablet; Take 1 tablet (20 mg total) by mouth daily.  Dysuria +UA, f/u culture, treat with amox due to symptoms -     Urinalysis, Complete -     Urine Culture -     amoxicillin (AMOXIL) 500 MG capsule; Take 1 capsule (500 mg total) by mouth 2 (two) times daily.  Other orders -     Microscopic Examination   Follow up plan: Return in about 3 months (around 07/04/2017). Gabriella Found, MD [facility]  [place]

## 2017-04-04 LAB — BASIC METABOLIC PANEL
BUN / CREAT RATIO: 18 (ref 12–28)
BUN: 20 mg/dL (ref 8–27)
CHLORIDE: 98 mmol/L (ref 96–106)
CO2: 25 mmol/L (ref 20–29)
Calcium: 9 mg/dL (ref 8.7–10.3)
Creatinine, Ser: 1.12 mg/dL — ABNORMAL HIGH (ref 0.57–1.00)
GFR calc Af Amer: 51 mL/min/{1.73_m2} — ABNORMAL LOW (ref >59)
GFR calc non Af Amer: 45 mL/min/{1.73_m2} — ABNORMAL LOW (ref >59)
GLUCOSE: 257 mg/dL — AB (ref 65–99)
POTASSIUM: 4.7 mmol/L (ref 3.5–5.2)
SODIUM: 137 mmol/L (ref 134–144)

## 2017-04-04 LAB — AMYLASE: Amylase: 44 U/L (ref 31–124)

## 2017-04-04 LAB — URINE CULTURE

## 2017-04-15 ENCOUNTER — Other Ambulatory Visit: Payer: Self-pay | Admitting: Pediatrics

## 2017-05-11 ENCOUNTER — Other Ambulatory Visit: Payer: Self-pay | Admitting: Pediatrics

## 2017-05-13 ENCOUNTER — Telehealth: Payer: Self-pay | Admitting: *Deleted

## 2017-05-13 DIAGNOSIS — M810 Age-related osteoporosis without current pathological fracture: Secondary | ICD-10-CM

## 2017-05-13 MED ORDER — ALENDRONATE SODIUM 70 MG PO TABS: ORAL_TABLET | ORAL | 0 refills | Status: DC

## 2017-05-13 NOTE — Telephone Encounter (Signed)
Per Dr. Autumn Patty 04/03/17 OV notes, pt is to be on 100 mg Januvia. Refilled fosamax #12 so that it will be a 3 mos supply to be lined up with other meds.

## 2017-07-04 ENCOUNTER — Ambulatory Visit (INDEPENDENT_AMBULATORY_CARE_PROVIDER_SITE_OTHER): Payer: Medicare Other | Admitting: Pediatrics

## 2017-07-04 ENCOUNTER — Encounter: Payer: Self-pay | Admitting: Pediatrics

## 2017-07-04 VITALS — BP 138/52 | HR 61 | Temp 97.7°F | Ht 65.0 in | Wt 185.6 lb

## 2017-07-04 DIAGNOSIS — I10 Essential (primary) hypertension: Secondary | ICD-10-CM

## 2017-07-04 DIAGNOSIS — E1169 Type 2 diabetes mellitus with other specified complication: Secondary | ICD-10-CM | POA: Diagnosis not present

## 2017-07-04 DIAGNOSIS — E785 Hyperlipidemia, unspecified: Secondary | ICD-10-CM

## 2017-07-04 DIAGNOSIS — E119 Type 2 diabetes mellitus without complications: Secondary | ICD-10-CM

## 2017-07-04 DIAGNOSIS — L570 Actinic keratosis: Secondary | ICD-10-CM

## 2017-07-04 LAB — BAYER DCA HB A1C WAIVED: HB A1C (BAYER DCA - WAIVED): 9.8 % — ABNORMAL HIGH

## 2017-07-04 NOTE — Progress Notes (Signed)
  Subjective:   Patient ID: Gabriella Ferguson, female    DOB: 08/24/1929, 83 y.o.   MRN: [id number] CC: Follow-up (3 month) med problems HPI: Gabriella Ferguson is a 83 y.o. female presenting for Follow-up (3 month)  DM2: eating some hershey's kisses every day Mostly eating frozen dinners Drinks sweet tea every day from fast food Open to decreasing sweet tea  AKs: regular sun exposure over past few years Has had areas frozen off before No itching or bleeding areas  HTN: no CP or SOB Taking meds regularly  HLD: taking statin regularly, no S/e  Relevant past medical, surgical, family and social history reviewed. Allergies and medications reviewed and updated. Social History   Tobacco Use  Smoking Status Never Smoker  Smokeless Tobacco Never Used   ROS: Per HPI   Objective:    BP (!) 138/52   Pulse 61   Temp 97.7 F (36.5 C) (Oral)   Ht 5\' 5"  (1.651 m)   Wt 185 lb 9.6 oz (84.2 kg)   BMI 30.89 kg/m   Wt Readings from Last 3 Encounters:  07/04/17 185 lb 9.6 oz (84.2 kg)  04/03/17 184 lb 9.6 oz (83.7 kg)  03/18/17 184 lb 12.8 oz (83.8 kg)    Gen: NAD, alert, cooperative with exam, NCAT EYES: EOMI, no conjunctival injection, or no icterus ENT: OP without erythema LYMPH: no cervical LAD CV: NRRR, normal S1/S2 Resp: CTABL, no wheezes, normal WOB Abd: +BS, soft, NTND. no guarding or organomegaly Ext: No edema, warm Neuro: Alert and oriented Skin: several rough 1-50mm lesions consistent with AKs scattered over face, neck  Assessment & Plan:  Gabriella Ferguson was seen today for follow-up multiple med problems  Diagnoses and all orders for this visit:  Type 2 diabetes mellitus without complication, without long-term current use of insulin ([place]) Continue to encourage diet changes Pt taking meds regularly, eating sweets regularly, does not want to check BGLs at home, does not want to be on insulin -     Bayer DCA Hb A1c Waived  Essential hypertension Stable, cont  meds  Hyperlipidemia associated with type 2 diabetes mellitus (HCC) Stable, cont statin  Actinic keratoses Discussed options, pt agrees to proceed with cryo therapy  Lesion destruction: Discussed risks, benefits and alternatives, pt agreed to proceed. Liquid nitrogen used on 13 AK lesions present over b/l sides of face, nose, neck   Follow up plan: Return in about 3 months (around 10/04/2017). Assunta Found, MD [place]

## 2017-09-30 ENCOUNTER — Other Ambulatory Visit: Payer: Self-pay | Admitting: Pediatrics

## 2017-09-30 DIAGNOSIS — E1169 Type 2 diabetes mellitus with other specified complication: Secondary | ICD-10-CM

## 2017-09-30 DIAGNOSIS — E119 Type 2 diabetes mellitus without complications: Secondary | ICD-10-CM

## 2017-09-30 DIAGNOSIS — E785 Hyperlipidemia, unspecified: Secondary | ICD-10-CM

## 2017-09-30 DIAGNOSIS — M810 Age-related osteoporosis without current pathological fracture: Secondary | ICD-10-CM

## 2017-10-01 NOTE — Telephone Encounter (Signed)
Next OV 10/07/17

## 2017-10-04 ENCOUNTER — Ambulatory Visit: Payer: Medicare Other | Admitting: Pediatrics

## 2017-10-07 ENCOUNTER — Ambulatory Visit (INDEPENDENT_AMBULATORY_CARE_PROVIDER_SITE_OTHER): Payer: Medicare Other | Admitting: Pediatrics

## 2017-10-07 ENCOUNTER — Encounter: Payer: Self-pay | Admitting: Pediatrics

## 2017-10-07 VITALS — BP 131/78 | HR 91 | Temp 98.8°F | Ht 65.0 in | Wt 182.0 lb

## 2017-10-07 DIAGNOSIS — G629 Polyneuropathy, unspecified: Secondary | ICD-10-CM | POA: Diagnosis not present

## 2017-10-07 DIAGNOSIS — J069 Acute upper respiratory infection, unspecified: Secondary | ICD-10-CM | POA: Diagnosis not present

## 2017-10-07 DIAGNOSIS — I10 Essential (primary) hypertension: Secondary | ICD-10-CM

## 2017-10-07 DIAGNOSIS — E119 Type 2 diabetes mellitus without complications: Secondary | ICD-10-CM | POA: Diagnosis not present

## 2017-10-07 LAB — BAYER DCA HB A1C WAIVED: HB A1C (BAYER DCA - WAIVED): 9.7 % — ABNORMAL HIGH (ref <7.0)

## 2017-10-07 MED ORDER — GABAPENTIN 300 MG PO CAPS: 300.0000 mg | ORAL_CAPSULE | Freq: Two times a day (BID) | ORAL | 1 refills | Status: DC

## 2017-10-07 MED ORDER — HYDROCHLOROTHIAZIDE 12.5 MG PO CAPS: 12.5000 mg | ORAL_CAPSULE | Freq: Every day | ORAL | 1 refills | Status: DC

## 2017-10-07 NOTE — Progress Notes (Signed)
  Subjective:   Patient ID: Gabriella Ferguson, female    DOB: 1928/09/23, 84 y.o.   MRN: [id number] CC: Follow-up; Nasal Congestion  HPI: Gabriella Ferguson is a 84 y.o. female presenting for Follow-up; Nasal Congestion   DM2: says she doesn't avoid sugar much. Doesn't cook at home, eats mostly frozen foods. Not diabetic specific frozen foods. Her husband had diabetes, her daughter has diabetes. She does not want to be on insulin. Eats sweet raspberry pre-packaged tart daily for breakfast. Keeps chocolate around at home. Drinks sweet tea thorughout the day.  Nasal congestion: ongoing past few days. Normal appetite, no fevers. No cough. Throat felt slightly scratchy first few days.  Neuropathy: symptoms well controlled, takes gabapentin as needed  Relevant past medical, surgical, family and social history reviewed. Allergies and medications reviewed and updated. Social History   Tobacco Use  Smoking Status Never Smoker  Smokeless Tobacco Never Used   ROS: Per HPI   Objective:    BP 131/78   Pulse 91   Temp 98.8 F (37.1 C) (Oral)   Ht 5\' 5"  (1.651 m)   Wt 182 lb (82.6 kg)   BMI 30.29 kg/m   Wt Readings from Last 3 Encounters:  10/07/17 182 lb (82.6 kg)  07/04/17 185 lb 9.6 oz (84.2 kg)  04/03/17 184 lb 9.6 oz (83.7 kg)    Gen: NAD, alert, cooperative with exam, NCAT EYES: EOMI, no conjunctival injection, or no icterus ENT:  TMs pearly gray, R TM slightly injected, OP without erythema LYMPH: no cervical LAD CV: NRRR, normal S1/S2, no murmur, distal pulses 2+ b/l Resp: CTABL, no wheezes, normal WOB Abd: +BS, soft, NTND. no guarding or organomegaly Ext: No edema, warm Neuro: Alert and oriented, strength equal b/l UE and LE, coordination grossly normal MSK: normal muscle bulk  Assessment & Plan:  Gabriella Ferguson was seen today for follow-up, nasal congestion and facial pain.  Diagnoses and all orders for this visit:  Type 2 diabetes mellitus without complication, without  long-term current use of insulin (HCC) A1c 9.7. With CKD limited options for additional oral agents. Discussed again with pt, she can help control some of elevated blood sugar by intake. She feels strongly about avoiding insulin. Recommended picking one thing of several sugary foods she regularly intakes to decrease amount and switching from sweet tea to unsweet tea.  Offered nutrition referral to help target plan for her, pt declined. She says she knows what she should do, will try to avoid sugar by looking at food labels more. Discussed looking at food labels and targeting <30g of added sugar in a day. -     Bayer DCA Hb A1c Waived  Neuropathy Stable, cont below. Cont to work on sugar intake decrease as above. -     gabapentin (NEURONTIN) 300 MG capsule; Take 1 capsule (300 mg total) by mouth 2 (two) times daily.  Essential hypertension Stable, cont meds -     hydrochlorothiazide (MICROZIDE) 12.5 MG capsule; Take 1 capsule (12.5 mg total) by mouth daily.  Acute URI Discussed symptom care, return precautions.  Follow up plan: Return in about 3 months (around 01/04/2018). Assunta Found, MD [place]

## 2017-10-07 NOTE — Patient Instructions (Signed)
Look at food labels. Try to keep "added sugar" amount less than 30 grams in a day.  Switch to unsweetened tea.

## 2017-10-15 ENCOUNTER — Ambulatory Visit: Payer: Medicare Other | Admitting: *Deleted

## 2017-12-30 ENCOUNTER — Other Ambulatory Visit: Payer: Self-pay | Admitting: Pediatrics

## 2017-12-30 DIAGNOSIS — E119 Type 2 diabetes mellitus without complications: Secondary | ICD-10-CM

## 2017-12-30 DIAGNOSIS — M810 Age-related osteoporosis without current pathological fracture: Secondary | ICD-10-CM

## 2017-12-30 NOTE — Telephone Encounter (Signed)
OV 01/06/18

## 2018-01-06 ENCOUNTER — Encounter: Payer: Self-pay | Admitting: Pediatrics

## 2018-01-06 ENCOUNTER — Ambulatory Visit (INDEPENDENT_AMBULATORY_CARE_PROVIDER_SITE_OTHER): Payer: Medicare Other | Admitting: Pediatrics

## 2018-01-06 VITALS — BP 136/69 | HR 71 | Temp 98.2°F | Ht 65.0 in | Wt 183.6 lb

## 2018-01-06 DIAGNOSIS — M810 Age-related osteoporosis without current pathological fracture: Secondary | ICD-10-CM | POA: Diagnosis not present

## 2018-01-06 DIAGNOSIS — E785 Hyperlipidemia, unspecified: Secondary | ICD-10-CM | POA: Diagnosis not present

## 2018-01-06 DIAGNOSIS — E119 Type 2 diabetes mellitus without complications: Secondary | ICD-10-CM | POA: Diagnosis not present

## 2018-01-06 DIAGNOSIS — E1169 Type 2 diabetes mellitus with other specified complication: Secondary | ICD-10-CM

## 2018-01-06 LAB — BAYER DCA HB A1C WAIVED: HB A1C (BAYER DCA - WAIVED): 10.3 % — ABNORMAL HIGH (ref <7.0)

## 2018-01-06 MED ORDER — ALENDRONATE SODIUM 70 MG PO TABS: ORAL_TABLET | ORAL | 1 refills | Status: DC

## 2018-01-06 MED ORDER — SIMVASTATIN 20 MG PO TABS: 20.0000 mg | ORAL_TABLET | Freq: Every day | ORAL | 2 refills | Status: DC

## 2018-01-06 NOTE — Progress Notes (Signed)
  Subjective:   Patient ID: Gabriella Ferguson, female    DOB: 1928/10/06, 84 y.o.   MRN: [id number] CC: Medical Management of Chronic Issues  HPI: Gabriella Ferguson is a 84 y.o. female   Diabetes: Taking her medicines regularly.  Eating sweets regularly.  Does not check her blood sugars at home right now.  Not on insulin.  Very hesitant to start.    Has family in the area, they have been supportive.  Neuropathy: Taking gabapentin twice a day with good improvement of her symptoms  Hypertension: Taking blood pressure medicine regularly, no lightheadedness, shortness of breath, chest pain.  Osteoporosis: Taking alendronate regularly.  Hyperlipidemia: Tolerating medicine well  Relevant past medical, surgical, family and social history reviewed. Allergies and medications reviewed and updated. Social History   Tobacco Use  Smoking Status Never Smoker  Smokeless Tobacco Never Used   ROS: Per HPI   Objective:    BP 136/69   Pulse 71   Temp 98.2 F (36.8 C) (Oral)   Ht 5\' 5"  (1.651 m)   Wt 183 lb 9.6 oz (83.3 kg)   BMI 30.55 kg/m   Wt Readings from Last 3 Encounters:  01/06/18 183 lb 9.6 oz (83.3 kg)  10/07/17 182 lb (82.6 kg)  07/04/17 185 lb 9.6 oz (84.2 kg)    Gen: NAD, alert, cooperative with exam, NCAT EYES: EOMI, no conjunctival injection, or no icterus ENT:  TMs pearly gray b/l, OP without erythema LYMPH: no cervical LAD CV: NRRR, normal S1/S2, no murmur, distal pulses 2+ b/l Resp: CTABL, no wheezes, normal WOB Abd: +BS, soft, NTND. no guarding or organomegaly Ext: No edema, warm Neuro: Alert and oriented, strength equal b/l UE and LE, coordination grossly normal MSK: normal muscle bulk  Assessment & Plan:  Gabriella Ferguson was seen today for medical management of chronic issues.  Diagnoses and all orders for this visit:  Type 2 diabetes mellitus without complication, without long-term current use of insulin (HCC) Average blood sugar 10.3.  About where it has been.   With patient's CKD I worry about increasing her SU.  She is on metformin and Januvia.  She declines start of insulin right now.  We discussed pathophysiology of diabetes, she is very familiar with it as her husband had multiple comp occasions related to it.  She says there is a lot she could do with her diet to change to help bring down her sugar and she wants to start with that. -     Bayer DCA Hb A1c Waived -     Basic Metabolic Panel  Hyperlipidemia associated with type 2 diabetes mellitus (HCC) Stable, continue below -     simvastatin (ZOCOR) 20 MG tablet; Take 1 tablet (20 mg total) by mouth daily.  Osteoporosis, unspecified osteoporosis type, unspecified pathological fracture presence Stable, continue below -     alendronate (FOSAMAX) 70 MG tablet; Take with a full glass of water on an empty stomach.   Follow up plan: Return in about 3 months (around 04/08/2018). Assunta Found, MD [place]

## 2018-01-07 LAB — BASIC METABOLIC PANEL
BUN / CREAT RATIO: 13 (ref 12–28)
BUN: 17 mg/dL (ref 8–27)
CHLORIDE: 99 mmol/L (ref 96–106)
CO2: 24 mmol/L (ref 20–29)
Calcium: 9.8 mg/dL (ref 8.7–10.3)
Creatinine, Ser: 1.29 mg/dL — ABNORMAL HIGH (ref 0.57–1.00)
GFR calc Af Amer: 43 mL/min/{1.73_m2} — ABNORMAL LOW (ref >59)
GFR calc non Af Amer: 38 mL/min/{1.73_m2} — ABNORMAL LOW (ref >59)
GLUCOSE: 250 mg/dL — AB (ref 65–99)
Potassium: 5 mmol/L (ref 3.5–5.2)
SODIUM: 139 mmol/L (ref 134–144)

## 2018-03-17 ENCOUNTER — Telehealth: Payer: Self-pay | Admitting: Pediatrics

## 2018-03-17 ENCOUNTER — Ambulatory Visit (INDEPENDENT_AMBULATORY_CARE_PROVIDER_SITE_OTHER): Payer: Medicare Other | Admitting: Pediatrics

## 2018-03-17 ENCOUNTER — Encounter: Payer: Self-pay | Admitting: Pediatrics

## 2018-03-17 VITALS — BP 130/67 | HR 72 | Temp 98.1°F | Ht 65.0 in | Wt 183.8 lb

## 2018-03-17 DIAGNOSIS — R300 Dysuria: Secondary | ICD-10-CM | POA: Diagnosis not present

## 2018-03-17 DIAGNOSIS — E119 Type 2 diabetes mellitus without complications: Secondary | ICD-10-CM | POA: Diagnosis not present

## 2018-03-17 DIAGNOSIS — N3090 Cystitis, unspecified without hematuria: Secondary | ICD-10-CM | POA: Diagnosis not present

## 2018-03-17 DIAGNOSIS — N183 Chronic kidney disease, stage 3 unspecified: Secondary | ICD-10-CM

## 2018-03-17 LAB — URINALYSIS, COMPLETE
BILIRUBIN UA: NEGATIVE
KETONES UA: NEGATIVE
NITRITE UA: NEGATIVE
SPEC GRAV UA: 1.025 (ref 1.005–1.030)
UUROB: 0.2 mg/dL (ref 0.2–1.0)
pH, UA: 5 (ref 5.0–7.5)

## 2018-03-17 LAB — MICROSCOPIC EXAMINATION: WBC, UA: >30 /hpf — AB (ref 0–5)

## 2018-03-17 MED ORDER — CEFPODOXIME PROXETIL 100 MG PO TABS: 100.0000 mg | ORAL_TABLET | Freq: Two times a day (BID) | ORAL | 0 refills | Status: DC

## 2018-03-17 MED ORDER — DOXYCYCLINE HYCLATE 100 MG PO TABS: 100.0000 mg | ORAL_TABLET | Freq: Two times a day (BID) | ORAL | 0 refills | Status: DC

## 2018-03-17 MED ORDER — SITAGLIPTIN PHOSPHATE 50 MG PO TABS: 50.0000 mg | ORAL_TABLET | Freq: Every day | ORAL | 1 refills | Status: DC

## 2018-03-17 NOTE — Telephone Encounter (Signed)
appt scheduled for evaluation Pt notified  

## 2018-03-17 NOTE — Progress Notes (Signed)
  Subjective:   Patient ID: Gabriella Ferguson, female    DOB: 1929/05/02, 84 y.o.   MRN: [id number] CC: Dysuria and Urinary Urgency  HPI: Gabriella Ferguson is a 83 y.o. female   Symptoms ongoing about a week.  Bothering her most of the time.  Burning with urination.  Appetite is been okay.  No fevers.  No vaginal discharge.  Left back side and back been bothering her since a hip fracture 4 to 5 years ago.  The pain is gotten better over the years.  She is not taking anything for her right now by mouth.  She has been using topical medicines, lidocaine patches with some improvement in symptoms.  She says she is aware of it all the time, it is not severe.  Relevant past medical, surgical, family and social history reviewed. Allergies and medications reviewed and updated. Social History   Tobacco Use  Smoking Status Never Smoker  Smokeless Tobacco Never Used   ROS: Per HPI   Objective:    BP 130/67   Pulse 72   Temp 98.1 F (36.7 C) (Oral)   Ht 5\' 5"  (1.651 m)   Wt 183 lb 12.8 oz (83.4 kg)   BMI 30.59 kg/m   Wt Readings from Last 3 Encounters:  03/17/18 183 lb 12.8 oz (83.4 kg)  01/06/18 183 lb 9.6 oz (83.3 kg)  10/07/17 182 lb (82.6 kg)    Gen: NAD, alert, cooperative with exam, NCAT EYES: EOMI, no conjunctival injection, or no icterus CV: NRRR, normal S1/S2, no murmur, distal pulses 2+ b/l Resp: CTABL, no wheezes, normal WOB Abd: +BS, soft, NTND Ext: No edema, warm Neuro: Alert and oriented MSK: normal muscle bulk  Assessment & Plan:  Chelsie was seen today for dysuria and urinary urgency.  Diagnoses and all orders for this visit:  Cystitis UA positive.  Start below.  We will follow-up urine culture. -     cefpodoxime (VANTIN) 100 MG tablet; Take 1 tablet (100 mg total) by mouth 2 (two) times daily for 7 days.  Dysuria -     Urinalysis, Complete -     Urine Culture; Future -     Urine Culture  Type 2 diabetes mellitus without complication, without long-term  current use of insulin (HCC) Renally dose adjust Januvia. -     sitaGLIPtin (JANUVIA) 50 MG tablet; Take 1 tablet (50 mg total) by mouth daily.  Stage 3 chronic kidney disease (HCC) Stable.  Follow up plan: Return in about 1 month (around 04/14/2018). Assunta Found, MD [place]

## 2018-03-17 NOTE — Addendum Note (Signed)
Addended by: Marin Olp on: 03/17/2018 01:25 PM   Modules accepted: Orders

## 2018-03-17 NOTE — Patient Instructions (Signed)
Start extra strength or arthritis strength acetaminophen twice a day to help with pain control

## 2018-03-21 LAB — URINE CULTURE

## 2018-03-26 ENCOUNTER — Ambulatory Visit (INDEPENDENT_AMBULATORY_CARE_PROVIDER_SITE_OTHER): Payer: Medicare Other | Admitting: Pediatrics

## 2018-03-26 ENCOUNTER — Encounter: Payer: Self-pay | Admitting: Pediatrics

## 2018-03-26 ENCOUNTER — Ambulatory Visit (INDEPENDENT_AMBULATORY_CARE_PROVIDER_SITE_OTHER): Payer: Medicare Other

## 2018-03-26 VITALS — BP 138/71 | HR 70 | Temp 99.0°F | Ht 65.0 in | Wt 180.6 lb

## 2018-03-26 DIAGNOSIS — R1031 Right lower quadrant pain: Secondary | ICD-10-CM | POA: Diagnosis not present

## 2018-03-26 DIAGNOSIS — R300 Dysuria: Secondary | ICD-10-CM

## 2018-03-26 DIAGNOSIS — N3090 Cystitis, unspecified without hematuria: Secondary | ICD-10-CM | POA: Diagnosis not present

## 2018-03-26 LAB — URINALYSIS, COMPLETE
Bilirubin, UA: NEGATIVE
Glucose, UA: NEGATIVE
Ketones, UA: NEGATIVE
Nitrite, UA: NEGATIVE
PH UA: 5 (ref 5.0–7.5)
RBC UA: NEGATIVE
Urobilinogen, Ur: 0.2 mg/dL (ref 0.2–1.0)

## 2018-03-26 LAB — MICROSCOPIC EXAMINATION

## 2018-03-26 MED ORDER — CEFUROXIME AXETIL 250 MG PO TABS: 250.0000 mg | ORAL_TABLET | Freq: Two times a day (BID) | ORAL | 0 refills | Status: DC

## 2018-03-26 NOTE — Progress Notes (Signed)
  Subjective:   Patient ID: Gabriella Ferguson, female    DOB: 05-27-1929, 84 y.o.   MRN: [id number] CC: Abdominal Pain (right)  HPI: Gabriella Ferguson is a 84 y.o. female   Right-sided abdominal pain ongoing for the past 3 days.  Most severe in the morning when she first wakes up.  Says she can hardly stand up out of bed at first.  After walking around for a couple hours the pain eases off.  Now it feels sore but there is no throbbing or sharp pain.  She describes it as a sharp cramping feeling.  Nothing seems to make the pain better other than maybe walking.  For the rest of the day she has some " soreness" in her right side.  Pain does not radiate.  No fevers, her appetite is been fine, eating regular meals no change.  No nausea or vomiting.  Having regular bowel movements, daily.  No constipation, no diarrhea.  No blood in her stools.  No history of kidney stones.  She was treated for urinary tract infection 10 days ago with doxycycline.  She continues to have dysuria.  Klebsiella resistant only to ampicillin grew in urine.  Relevant past medical, surgical, family and social history reviewed. Allergies and medications reviewed and updated. Social History   Tobacco Use  Smoking Status Never Smoker  Smokeless Tobacco Never Used   ROS: Per HPI   Objective:    BP 138/71   Pulse 70   Temp 99 F (37.2 C) (Oral)   Ht '5\' 5"'$  (1.651 m)   Wt 180 lb 9.6 oz (81.9 kg)   BMI 30.05 kg/m   Wt Readings from Last 3 Encounters:  03/26/18 180 lb 9.6 oz (81.9 kg)  03/17/18 183 lb 12.8 oz (83.4 kg)  01/06/18 183 lb 9.6 oz (83.3 kg)    Gen: NAD, well-appearing, alert, cooperative with exam, NCAT EYES: EOMI, no conjunctival injection, or no icterus CV: NRRR, normal S1/S2 Resp: CTABL, no wheezes Abd: +BS, soft, nontender, nondistended, "sore" with palpation right lower quadrant.  No rebound.  No guarding.  No CVA tenderness Ext: No edema, warm Neuro: Alert and oriented MSK: No pain with range  of motion right or left hip.  Assessment & Plan:  Kya was seen today for abdominal pain.  Diagnoses and all orders for this visit:  Right lower quadrant abdominal pain Reassuring exam.  Will repeat urine studies, given severity of pain in the morning we will get blood work.  Minimal pain now.   -     Urine Culture -     Urinalysis, Complete -     CBC with Differential/Platelet -     CMP14+EGFR -     DG Abd 1 View; Future  Dysuria -     cefUROXime (CEFTIN) 250 MG tablet; Take 1 tablet (250 mg total) by mouth 2 (two) times daily with a meal.  Cystitis -     cefUROXime (CEFTIN) 250 MG tablet; Take 1 tablet (250 mg total) by mouth 2 (two) times daily with a meal.   Follow up plan: As needed Assunta Found, MD [place]

## 2018-03-29 ENCOUNTER — Other Ambulatory Visit: Payer: Self-pay | Admitting: Pediatrics

## 2018-03-29 DIAGNOSIS — E119 Type 2 diabetes mellitus without complications: Secondary | ICD-10-CM

## 2018-03-29 LAB — URINE CULTURE

## 2018-04-01 ENCOUNTER — Other Ambulatory Visit: Payer: Medicare Other

## 2018-04-01 DIAGNOSIS — R1031 Right lower quadrant pain: Secondary | ICD-10-CM | POA: Diagnosis not present

## 2018-04-01 LAB — CMP14+EGFR
A/G RATIO: 1.7 (ref 1.2–2.2)
ALK PHOS: 54 IU/L (ref 39–117)
ALT: 12 IU/L (ref 0–32)
AST: 21 IU/L (ref 0–40)
Albumin: 4 g/dL (ref 3.5–4.7)
BUN/Creatinine Ratio: 13 (ref 12–28)
BUN: 18 mg/dL (ref 8–27)
Bilirubin Total: 0.3 mg/dL (ref 0.0–1.2)
CHLORIDE: 100 mmol/L (ref 96–106)
CO2: 21 mmol/L (ref 20–29)
Calcium: 9 mg/dL (ref 8.7–10.3)
Creatinine, Ser: 1.36 mg/dL — ABNORMAL HIGH (ref 0.57–1.00)
GFR calc Af Amer: 40 mL/min/{1.73_m2} — ABNORMAL LOW (ref >59)
GFR calc non Af Amer: 35 mL/min/{1.73_m2} — ABNORMAL LOW (ref >59)
GLOBULIN, TOTAL: 2.3 g/dL (ref 1.5–4.5)
Glucose: 193 mg/dL — ABNORMAL HIGH (ref 65–99)
POTASSIUM: 5 mmol/L (ref 3.5–5.2)
SODIUM: 140 mmol/L (ref 134–144)
Total Protein: 6.3 g/dL (ref 6.0–8.5)

## 2018-04-01 LAB — CBC WITH DIFFERENTIAL/PLATELET
BASOS: 0 %
Basophils Absolute: 0 10*3/uL (ref 0.0–0.2)
EOS (ABSOLUTE): 0.2 10*3/uL (ref 0.0–0.4)
EOS: 3 %
HEMATOCRIT: 35.8 % (ref 34.0–46.6)
Hemoglobin: 10.9 g/dL — ABNORMAL LOW (ref 11.1–15.9)
IMMATURE GRANULOCYTES: 0 %
Immature Grans (Abs): 0 10*3/uL (ref 0.0–0.1)
LYMPHS ABS: 2.8 10*3/uL (ref 0.7–3.1)
Lymphs: 33 %
MCH: 25.5 pg — ABNORMAL LOW (ref 26.6–33.0)
MCHC: 30.4 g/dL — AB (ref 31.5–35.7)
MCV: 84 fL (ref 79–97)
MONOS ABS: 0.8 10*3/uL (ref 0.1–0.9)
Monocytes: 9 %
NEUTROS PCT: 55 %
Neutrophils Absolute: 4.6 10*3/uL (ref 1.4–7.0)
PLATELETS: 290 10*3/uL (ref 150–450)
RBC: 4.28 x10E6/uL (ref 3.77–5.28)
RDW: 15.2 % (ref 12.3–15.4)
WBC: 8.4 10*3/uL (ref 3.4–10.8)

## 2018-04-09 ENCOUNTER — Encounter: Payer: Self-pay | Admitting: Pediatrics

## 2018-04-09 ENCOUNTER — Ambulatory Visit (INDEPENDENT_AMBULATORY_CARE_PROVIDER_SITE_OTHER): Payer: Medicare Other | Admitting: Pediatrics

## 2018-04-09 VITALS — BP 134/73 | HR 90 | Temp 98.6°F | Ht 65.0 in | Wt 180.6 lb

## 2018-04-09 DIAGNOSIS — R300 Dysuria: Secondary | ICD-10-CM | POA: Diagnosis not present

## 2018-04-09 DIAGNOSIS — G629 Polyneuropathy, unspecified: Secondary | ICD-10-CM | POA: Diagnosis not present

## 2018-04-09 DIAGNOSIS — E119 Type 2 diabetes mellitus without complications: Secondary | ICD-10-CM | POA: Diagnosis not present

## 2018-04-09 DIAGNOSIS — I10 Essential (primary) hypertension: Secondary | ICD-10-CM | POA: Diagnosis not present

## 2018-04-09 DIAGNOSIS — R1011 Right upper quadrant pain: Secondary | ICD-10-CM

## 2018-04-09 DIAGNOSIS — B379 Candidiasis, unspecified: Secondary | ICD-10-CM

## 2018-04-09 DIAGNOSIS — Z23 Encounter for immunization: Secondary | ICD-10-CM | POA: Diagnosis not present

## 2018-04-09 LAB — URINALYSIS, COMPLETE
BILIRUBIN UA: NEGATIVE
GLUCOSE, UA: NEGATIVE
KETONES UA: NEGATIVE
Nitrite, UA: NEGATIVE
SPEC GRAV UA: 1.025 (ref 1.005–1.030)
Urobilinogen, Ur: 0.2 mg/dL (ref 0.2–1.0)
pH, UA: 5 (ref 5.0–7.5)

## 2018-04-09 LAB — MICROSCOPIC EXAMINATION

## 2018-04-09 LAB — BAYER DCA HB A1C WAIVED: HB A1C: 10.1 % — AB (ref <7.0)

## 2018-04-09 MED ORDER — GABAPENTIN 300 MG PO CAPS: 300.0000 mg | ORAL_CAPSULE | Freq: Two times a day (BID) | ORAL | 1 refills | Status: DC

## 2018-04-09 MED ORDER — HYDROCHLOROTHIAZIDE 12.5 MG PO CAPS: 12.5000 mg | ORAL_CAPSULE | Freq: Every day | ORAL | 1 refills | Status: DC

## 2018-04-09 MED ORDER — FLUCONAZOLE 150 MG PO TABS: 150.0000 mg | ORAL_TABLET | Freq: Once | ORAL | 0 refills | Status: AC

## 2018-04-09 NOTE — Progress Notes (Signed)
Subjective:   Patient ID: Gabriella Ferguson, female    DOB: [birth year], 84 y.o.   MRN: [id number] CC: Medical Management of Chronic Issues  HPI: Gabriella Ferguson is a 84 y.o. female   Diabetes: On metformin, amaryl, and Januvia. Says she could do better avoiding carbohydrates and sweets in her diet. She does not want to check her BGLs at home. Has been drinking a lot of cranberry juice recently bc of UTI symptoms past few weeks.  Abd Pain: started improving about 1 week ago. R side still feels sore when she pushes on it now but much improved. Does not think she passed a kidney stone. No fevers. Now s/p UTI treatment, having some vaginal itching all the time. No vaginal discharge. No rash. Sometimes burning with urination but she thinks could be from irritation.   HTN: taking medicine regularly, no lightheadedness, SOB or CP  Neuropathy: symptoms ok on gabapentin.  HLD: taking statin regularly, no s/e  Relevant past medical, surgical, family and social history reviewed. Allergies and medications reviewed and updated. Social History   Tobacco Use  Smoking Status Never Smoker  Smokeless Tobacco Never Used   ROS: Per HPI   Objective:    BP 134/73   Pulse 90   Temp 98.6 F (37 C) (Oral)   Ht 5\' 5"  (1.651 m)   Wt 180 lb 9.6 oz (81.9 kg)   BMI 30.05 kg/m   Wt Readings from Last 3 Encounters:  04/09/18 180 lb 9.6 oz (81.9 kg)  03/26/18 180 lb 9.6 oz (81.9 kg)  03/17/18 183 lb 12.8 oz (83.4 kg)    Gen: NAD, alert, cooperative with exam, NCAT EYES: EOMI, no conjunctival injection, or no icterus ENT: OP without erythema LYMPH: no cervical LAD CV: NRRR, normal S1/S2, no murmur, distal pulses 2+ b/l Resp: CTABL, no wheezes, normal WOB Abd: +BS, soft, mildly "sore" R side, ND. no guarding or organomegaly Ext: No edema, warm Neuro: Alert and oriented Assessment & Plan:  Gabriella Ferguson was seen today for medical management of chronic issues.  Diagnoses and all orders for this  visit:  Type 2 diabetes mellitus without complication, without long-term current use of insulin (HCC) Remains quite elevated, 10.1. Cont current medications. Pt does not want to start any new medicines. She does agree to stopping cranberry juice and cutting back on daily sweet intake. -     Bayer DCA Hb A1c Waived -     Microalbumin / creatinine urine ratio  Essential hypertension Stable, cont current meds -     hydrochlorothiazide (MICROZIDE) 12.5 MG capsule; Take 1 capsule (12.5 mg total) by mouth daily. -     Basic Metabolic Panel  Neuropathy Stable, cont current meds -     gabapentin (NEURONTIN) 300 MG capsule; Take 1 capsule (300 mg total) by mouth 2 (two) times daily.  Need for pneumococcal vaccination -     Pneumococcal polysaccharide vaccine 23-valent greater than or equal to 2yo subcutaneous/IM  Right upper quadrant abdominal pain Much improved. Still feels sore. stooling regularly. If pain returns needs additional imaging such as CT scan.   Yeast infection If not improving let me know -     fluconazole (DIFLUCAN) 150 MG tablet; Take 1 tablet (150 mg total) by mouth once for 1 dose.  Dysuria S/p treatment for UTI, still with irritation. If culture positive consider treatment -     Urinalysis, Complete -     Urine Culture  Other orders -     Microscopic  Examination   Follow up plan: Return in about 3 months (around 07/10/2018). Assunta Found, MD [place]

## 2018-04-09 NOTE — Patient Instructions (Addendum)
Stop alendronate/fosamax because of decreased kidney function   Please make sure you are taking sitagliptan/januvia 50mg  once a day in the morning, not 100mg .

## 2018-04-10 LAB — MICROALBUMIN / CREATININE URINE RATIO
CREATININE, UR: 119.2 mg/dL
Microalb/Creat Ratio: 35.5 mg/g creat — ABNORMAL HIGH (ref 0.0–30.0)
Microalbumin, Urine: 42.3 ug/mL

## 2018-04-10 LAB — BASIC METABOLIC PANEL
BUN / CREAT RATIO: 13 (ref 12–28)
BUN: 16 mg/dL (ref 8–27)
CO2: 23 mmol/L (ref 20–29)
CREATININE: 1.24 mg/dL — AB (ref 0.57–1.00)
Calcium: 9.4 mg/dL (ref 8.7–10.3)
Chloride: 100 mmol/L (ref 96–106)
GFR calc Af Amer: 45 mL/min/{1.73_m2} — ABNORMAL LOW (ref >59)
GFR calc non Af Amer: 39 mL/min/{1.73_m2} — ABNORMAL LOW (ref >59)
Glucose: 238 mg/dL — ABNORMAL HIGH (ref 65–99)
Potassium: 4.7 mmol/L (ref 3.5–5.2)
SODIUM: 141 mmol/L (ref 134–144)

## 2018-04-10 LAB — URINE CULTURE

## 2018-04-14 ENCOUNTER — Ambulatory Visit: Payer: Medicare Other

## 2018-06-23 ENCOUNTER — Other Ambulatory Visit: Payer: Self-pay | Admitting: Pediatrics

## 2018-06-23 DIAGNOSIS — E119 Type 2 diabetes mellitus without complications: Secondary | ICD-10-CM

## 2018-07-11 ENCOUNTER — Ambulatory Visit: Payer: Medicare Other | Admitting: Pediatrics

## 2018-09-01 ENCOUNTER — Other Ambulatory Visit: Payer: Self-pay | Admitting: Pediatrics

## 2018-09-01 DIAGNOSIS — M810 Age-related osteoporosis without current pathological fracture: Secondary | ICD-10-CM

## 2018-09-19 ENCOUNTER — Encounter: Payer: Self-pay | Admitting: Family

## 2018-09-19 ENCOUNTER — Ambulatory Visit (INDEPENDENT_AMBULATORY_CARE_PROVIDER_SITE_OTHER): Payer: Medicare Other | Admitting: Family

## 2018-09-19 VITALS — BP 143/70 | HR 70 | Temp 98.1°F | Ht 65.0 in | Wt 178.2 lb

## 2018-09-19 DIAGNOSIS — N3000 Acute cystitis without hematuria: Secondary | ICD-10-CM

## 2018-09-19 DIAGNOSIS — M8000XD Age-related osteoporosis with current pathological fracture, unspecified site, subsequent encounter for fracture with routine healing: Secondary | ICD-10-CM

## 2018-09-19 DIAGNOSIS — E1169 Type 2 diabetes mellitus with other specified complication: Secondary | ICD-10-CM | POA: Diagnosis not present

## 2018-09-19 DIAGNOSIS — K219 Gastro-esophageal reflux disease without esophagitis: Secondary | ICD-10-CM

## 2018-09-19 DIAGNOSIS — I10 Essential (primary) hypertension: Secondary | ICD-10-CM

## 2018-09-19 DIAGNOSIS — E119 Type 2 diabetes mellitus without complications: Secondary | ICD-10-CM | POA: Diagnosis not present

## 2018-09-19 DIAGNOSIS — R350 Frequency of micturition: Secondary | ICD-10-CM | POA: Diagnosis not present

## 2018-09-19 DIAGNOSIS — E785 Hyperlipidemia, unspecified: Secondary | ICD-10-CM

## 2018-09-19 LAB — CMP14+EGFR
ALT: 13 IU/L (ref 0–32)
AST: 19 IU/L (ref 0–40)
Albumin/Globulin Ratio: 1.7 (ref 1.2–2.2)
Albumin: 4 g/dL (ref 3.6–4.6)
Alkaline Phosphatase: 50 IU/L (ref 39–117)
BUN/Creatinine Ratio: 14 (ref 12–28)
BUN: 16 mg/dL (ref 8–27)
Bilirubin Total: 0.3 mg/dL (ref 0.0–1.2)
CALCIUM: 9.2 mg/dL (ref 8.7–10.3)
CO2: 22 mmol/L (ref 20–29)
CREATININE: 1.18 mg/dL — AB (ref 0.57–1.00)
Chloride: 101 mmol/L (ref 96–106)
GFR calc Af Amer: 48 mL/min/{1.73_m2} — ABNORMAL LOW (ref >59)
GFR, EST NON AFRICAN AMERICAN: 42 mL/min/{1.73_m2} — AB (ref >59)
Globulin, Total: 2.4 g/dL (ref 1.5–4.5)
Glucose: 214 mg/dL — ABNORMAL HIGH (ref 65–99)
Potassium: 4.6 mmol/L (ref 3.5–5.2)
Sodium: 141 mmol/L (ref 134–144)
Total Protein: 6.4 g/dL (ref 6.0–8.5)

## 2018-09-19 LAB — BAYER DCA HB A1C WAIVED: HB A1C: 10 % — AB (ref <7.0)

## 2018-09-19 LAB — CBC WITH DIFFERENTIAL/PLATELET
BASOS: 1 %
Basophils Absolute: 0 10*3/uL (ref 0.0–0.2)
EOS (ABSOLUTE): 0.1 10*3/uL (ref 0.0–0.4)
EOS: 1 %
HEMATOCRIT: 34.7 % (ref 34.0–46.6)
HEMOGLOBIN: 10.8 g/dL — AB (ref 11.1–15.9)
IMMATURE GRANS (ABS): 0 10*3/uL (ref 0.0–0.1)
IMMATURE GRANULOCYTES: 1 %
LYMPHS: 34 %
Lymphocytes Absolute: 2.6 10*3/uL (ref 0.7–3.1)
MCH: 26.2 pg — ABNORMAL LOW (ref 26.6–33.0)
MCHC: 31.1 g/dL — ABNORMAL LOW (ref 31.5–35.7)
MCV: 84 fL (ref 79–97)
MONOCYTES: 6 %
Monocytes Absolute: 0.5 10*3/uL (ref 0.1–0.9)
NEUTROS PCT: 57 %
Neutrophils Absolute: 4.3 10*3/uL (ref 1.4–7.0)
PLATELETS: 266 10*3/uL (ref 150–450)
RBC: 4.12 x10E6/uL (ref 3.77–5.28)
RDW: 13.7 % (ref 11.7–15.4)
WBC: 7.5 10*3/uL (ref 3.4–10.8)

## 2018-09-19 LAB — URINALYSIS, COMPLETE
Bilirubin, UA: NEGATIVE
Glucose, UA: NEGATIVE
Ketones, UA: NEGATIVE
Nitrite, UA: NEGATIVE
PH UA: 5.5 (ref 5.0–7.5)
SPEC GRAV UA: 1.025 (ref 1.005–1.030)
Urobilinogen, Ur: 0.2 mg/dL (ref 0.2–1.0)

## 2018-09-19 LAB — LIPID PANEL
CHOL/HDL RATIO: 3.5 ratio (ref 0.0–4.4)
Cholesterol, Total: 138 mg/dL (ref 100–199)
HDL: 39 mg/dL — AB (ref >39)
LDL Calculated: 54 mg/dL (ref 0–99)
Triglycerides: 225 mg/dL — ABNORMAL HIGH (ref 0–149)
VLDL CHOLESTEROL CAL: 45 mg/dL — AB (ref 5–40)

## 2018-09-19 LAB — MICROSCOPIC EXAMINATION
Renal Epithel, UA: NONE SEEN /hpf
WBC, UA: >30 /hpf — AB (ref 0–5)

## 2018-09-19 MED ORDER — CEPHALEXIN 500 MG PO CAPS: 500.0000 mg | ORAL_CAPSULE | Freq: Two times a day (BID) | ORAL | 0 refills | Status: DC

## 2018-09-19 NOTE — Patient Instructions (Signed)

## 2018-09-19 NOTE — Progress Notes (Signed)
Subjective:    Patient ID: Gabriella Ferguson, female    DOB: 1929-08-24, 85 y.o.   MRN: [id number]  Chief Complaint  Patient presents with  . Medical Management of Chronic Issues  . Urinary Frequency    Hypertension  This is a chronic problem. The current episode started more than 1 year ago. The problem has been waxing and waning since onset. The problem is controlled. Associated symptoms include malaise/fatigue. Pertinent negatives include no blurred vision, peripheral edema or shortness of breath. Risk factors for coronary artery disease include diabetes mellitus and sedentary lifestyle. The current treatment provides moderate improvement.  Gastroesophageal Reflux  She complains of heartburn. She reports no belching or no coughing. This is a chronic problem. The current episode started more than 1 year ago. The problem occurs occasionally. The problem has been waxing and waning. She has tried a PPI for the symptoms. The treatment provided moderate relief.  Hyperlipidemia  This is a chronic problem. The current episode started more than 1 year ago. The problem is controlled. Recent lipid tests were reviewed and are normal. Pertinent negatives include no shortness of breath. Current antihyperlipidemic treatment includes statins. The current treatment provides moderate improvement of lipids. Risk factors for coronary artery disease include dyslipidemia, hypertension and a sedentary lifestyle.  Diabetes  She presents for her follow-up diabetic visit. She has type 2 diabetes mellitus. Her disease course has been stable. There are no hypoglycemic associated symptoms. Pertinent negatives for diabetes include no blurred vision, no foot paresthesias and no visual change. Symptoms are stable. She is following a generally healthy diet. (Does not check blood sugars at home )  Urinary Frequency   This is a new problem. The current episode started 1 to 4 weeks ago. The problem has been gradually  worsening. The quality of the pain is described as burning. The pain is mild. Associated symptoms include frequency and urgency. Pertinent negatives include no flank pain. She has tried increased fluids for the symptoms. The treatment provided mild relief.      Review of Systems  Constitutional: Positive for malaise/fatigue.  Eyes: Negative for blurred vision.  Respiratory: Negative for cough and shortness of breath.   Gastrointestinal: Positive for heartburn.  Genitourinary: Positive for frequency and urgency. Negative for flank pain.  All other systems reviewed and are negative.      Objective:   Physical Exam Vitals signs reviewed.  Constitutional:      General: She is not in acute distress.    Appearance: She is well-developed.  HENT:     Head: Normocephalic and atraumatic.     Right Ear: Tympanic membrane normal.     Left Ear: Tympanic membrane normal.  Eyes:     Pupils: Pupils are equal, round, and reactive to light.  Neck:     Musculoskeletal: Normal range of motion and neck supple.     Thyroid: No thyromegaly.  Cardiovascular:     Rate and Rhythm: Normal rate and regular rhythm.     Heart sounds: Normal heart sounds. No murmur.  Pulmonary:     Effort: Pulmonary effort is normal. No respiratory distress.     Breath sounds: Normal breath sounds. No wheezing.  Abdominal:     General: Bowel sounds are normal. There is no distension.     Palpations: Abdomen is soft.     Tenderness: There is abdominal tenderness (mild lower).  Musculoskeletal:        General: No tenderness.     Comments: Using cane  Skin:    General: Skin is warm and dry.  Neurological:     Mental Status: She is alert and oriented to person, place, and time.     Cranial Nerves: No cranial nerve deficit.     Deep Tendon Reflexes: Reflexes are normal and symmetric.  Psychiatric:        Behavior: Behavior normal.        Thought Content: Thought content normal.        Judgment: Judgment normal.        BP (!) 143/70   Pulse 70   Temp 98.1 F (36.7 C) (Oral)   Ht '5\' 5"'$  (1.651 m)   Wt 178 lb 3.2 oz (80.8 kg)   BMI 29.65 kg/m      Assessment & Plan:  Gabriella Ferguson comes in today with chief complaint of Medical Management of Chronic Issues and Urinary Frequency   Diagnosis and orders addressed:  1. Essential hypertension - CMP14+EGFR - CBC with Differential/Platelet  2. Type 2 diabetes mellitus without complication, without long-term current use of insulin (HCC) - Bayer DCA Hb A1c Waived - CMP14+EGFR - CBC with Differential/Platelet - Ambulatory referral to Ophthalmology  3. Hyperlipidemia associated with type 2 diabetes mellitus ([place]) - CMP14+EGFR - CBC with Differential/Platelet - Lipid panel  4. Gastroesophageal reflux disease, esophagitis presence not specified - CMP14+EGFR - CBC with Differential/Platelet  5. Osteoporosis with current pathological fracture with routine healing, unspecified osteoporosis type, subsequent encounter - CMP14+EGFR - CBC with Differential/Platelet  6. Urinary frequency - Urinalysis, Complete - CMP14+EGFR - CBC with Differential/Platelet  7. Acute cystitis without hematuria - Urine Culture - cephALEXin (KEFLEX) 500 MG capsule; Take 1 capsule (500 mg total) by mouth 2 (two) times daily.  Dispense: 14 capsule; Refill: 0   Labs pending Health Maintenance reviewed Diet and exercise encouraged  Follow up plan: 6 months    Gabriella Dun, FNP

## 2018-09-20 ENCOUNTER — Other Ambulatory Visit: Payer: Self-pay | Admitting: Pediatrics

## 2018-09-20 DIAGNOSIS — E119 Type 2 diabetes mellitus without complications: Secondary | ICD-10-CM

## 2018-09-21 LAB — URINE CULTURE

## 2018-09-23 ENCOUNTER — Other Ambulatory Visit: Payer: Self-pay | Admitting: Family

## 2018-09-23 DIAGNOSIS — E1165 Type 2 diabetes mellitus with hyperglycemia: Secondary | ICD-10-CM

## 2018-09-23 MED ORDER — INSULIN DEGLUDEC 100 UNIT/ML ~~LOC~~ SOPN: 8.0000 [IU] | PEN_INJECTOR | Freq: Every day | SUBCUTANEOUS | 0 refills | Status: DC

## 2018-09-23 MED ORDER — PEN NEEDLES 32G X 5 MM MISC: 1.0000 "application " | Freq: Every day | 6 refills | Status: DC

## 2018-09-24 ENCOUNTER — Telehealth: Payer: Self-pay

## 2018-09-24 NOTE — Telephone Encounter (Signed)
INsurance denied prior auth for Tresiba Flextouch  Needs to try and fail both Lantus and Toujeo

## 2018-10-07 DIAGNOSIS — H5203 Hypermetropia, bilateral: Secondary | ICD-10-CM | POA: Diagnosis not present

## 2018-10-07 DIAGNOSIS — Z7984 Long term (current) use of oral hypoglycemic drugs: Secondary | ICD-10-CM | POA: Diagnosis not present

## 2018-10-07 DIAGNOSIS — H35371 Puckering of macula, right eye: Secondary | ICD-10-CM | POA: Diagnosis not present

## 2018-10-07 DIAGNOSIS — Z9841 Cataract extraction status, right eye: Secondary | ICD-10-CM | POA: Diagnosis not present

## 2018-10-07 DIAGNOSIS — E119 Type 2 diabetes mellitus without complications: Secondary | ICD-10-CM | POA: Diagnosis not present

## 2018-10-07 LAB — HM DIABETES EYE EXAM

## 2018-12-01 ENCOUNTER — Other Ambulatory Visit: Payer: Self-pay | Admitting: *Deleted

## 2018-12-01 DIAGNOSIS — G629 Polyneuropathy, unspecified: Secondary | ICD-10-CM

## 2018-12-01 DIAGNOSIS — M810 Age-related osteoporosis without current pathological fracture: Secondary | ICD-10-CM

## 2018-12-01 MED ORDER — GABAPENTIN 300 MG PO CAPS: 300.0000 mg | ORAL_CAPSULE | Freq: Two times a day (BID) | ORAL | 0 refills | Status: DC

## 2018-12-01 MED ORDER — ALENDRONATE SODIUM 70 MG PO TABS: ORAL_TABLET | ORAL | 0 refills | Status: DC

## 2018-12-01 NOTE — Addendum Note (Signed)
Addended by: Antonietta Barcelona D on: 12/01/2018 09:36 AM   Modules accepted: Orders

## 2018-12-19 ENCOUNTER — Other Ambulatory Visit: Payer: Self-pay | Admitting: Pediatrics

## 2018-12-19 ENCOUNTER — Other Ambulatory Visit: Payer: Self-pay | Admitting: Family

## 2018-12-19 DIAGNOSIS — I10 Essential (primary) hypertension: Secondary | ICD-10-CM

## 2018-12-19 DIAGNOSIS — E119 Type 2 diabetes mellitus without complications: Secondary | ICD-10-CM

## 2018-12-19 DIAGNOSIS — E1169 Type 2 diabetes mellitus with other specified complication: Secondary | ICD-10-CM

## 2018-12-19 DIAGNOSIS — E785 Hyperlipidemia, unspecified: Principal | ICD-10-CM

## 2018-12-19 NOTE — Telephone Encounter (Signed)
OV 09/19/18 rtc 6 mos

## 2019-01-21 ENCOUNTER — Ambulatory Visit (INDEPENDENT_AMBULATORY_CARE_PROVIDER_SITE_OTHER): Payer: Medicare Other

## 2019-01-21 ENCOUNTER — Other Ambulatory Visit: Payer: Self-pay

## 2019-01-21 DIAGNOSIS — Z0000 Encounter for general adult medical examination without abnormal findings: Secondary | ICD-10-CM

## 2019-01-21 NOTE — Progress Notes (Signed)
MEDICARE ANNUAL WELLNESS VISIT  01/21/2019  Telephone Visit Disclaimer This Medicare AWV was conducted by telephone due to national recommendations for restrictions regarding the COVID-19 Pandemic (e.g. social distancing).  I verified, using two identifiers, that I am speaking with Gabriella Ferguson or their authorized healthcare agent. I discussed the limitations, risks, security, and privacy concerns of performing an evaluation and management service by telephone and the potential availability of an in-person appointment in the future. The patient expressed understanding and agreed to proceed.   Subjective:  Gabriella Ferguson is a 85 y.o. female patient of Hawks, Theador Hawthorne, FNP who had a Medicare Annual Wellness Visit today via telephone. Eladia is Retired and lives alone. she has 2 children. she reports that she is socially active and does interact with friends/family regularly. she is minimally physically active and enjoys working cross word puzzles. Both her daughters live nearby. The oldest is very sickly so the youngest daughter helps the patient out as much as possible. She prepares her medicine weekly and takes her to and from appts and stores. Patient also still drives herself. She walks with a cane and has chronic pain in her left hip and left knee. She lives at a senior apartment facility  Patient Care Team: Sharion Balloon, FNP as PCP - General (Family Medicine)  Advanced Directives 01/21/2019  Does Patient Have a Medical Advance Directive? No  Would patient like information on creating a medical advance directive? No - Patient declined   Patient states that she has instructed her daughters on her wishes  Hospital Utilization Over the Past 12 Months: # of hospitalizations or ER visits: 0 # of surgeries: 0  Review of Systems    Patient reports that her overall health is unchanged compared to last year.  Patient Reported Readings (BP, Pulse, CBG, Weight, etc) none   Review of Systems: History obtained from chart review  All other systems negative.  Pain Assessment Pain : 0-10 Pain Score: 5  Pain Type: Chronic pain Pain Location: Hip Pain Orientation: Left Pain Descriptors / Indicators: Burning Pain Onset: More than a month ago Pain Frequency: Constant Pain Relieving Factors: Pain creams Effect of Pain on Daily Activities: Slows patient down at times  Pain Relieving Factors: Pain creams  Current Medications & Allergies (verified) Allergies as of 01/21/2019   No Known Allergies     Medication List       Accurate as of Jan 21, 2019  9:59 AM. If you have any questions, ask your nurse or doctor.        STOP taking these medications   cephALEXin 500 MG capsule Commonly known as:  KEFLEX     TAKE these medications   alendronate 70 MG tablet Commonly known as:  FOSAMAX TAKE 1 TABLET ONCE A WEEK WITH FULL GLASS OF WATER 30 MINUTES BEFORE BREAKFAST   aspirin EC 81 MG tablet Take 81 mg by mouth daily.   gabapentin 300 MG capsule Commonly known as:  NEURONTIN Take 1 capsule (300 mg total) by mouth 2 (two) times daily.   glimepiride 4 MG tablet Commonly known as:  AMARYL TAKE 1 TABLET ONCE DAILY   hydrochlorothiazide 12.5 MG capsule Commonly known as:  MICROZIDE Take 1 capsule (12.5 mg total) by mouth daily.   Januvia 100 MG tablet Generic drug:  sitaGLIPtin TAKE 1 TABLET DAILY   loratadine 10 MG tablet Commonly known as:  CLARITIN Take 10 mg by mouth daily as needed.   metFORMIN 1000  MG tablet Commonly known as:  GLUCOPHAGE TAKE (1) TABLET TWICE A DAY WITH MEALS (BREAKFAST AND SUPPER)   simvastatin 20 MG tablet Commonly known as:  ZOCOR TAKE 1 TABLET DAILY       History (reviewed): Past Medical History:  Diagnosis Date  . Breast cancer ([place])   . Diabetes mellitus without complication (HCC)    Type 2  . Hip fracture ([place])   . Meningitis    Past Surgical History:  Procedure Laterality Date  . HIP CLOSED  REDUCTION     Family History  Problem Relation Age of Onset  . Diabetes Daughter   . Stroke Mother   . Stroke Father    Social History   Socioeconomic History  . Marital status: Widowed    Spouse name: Not on file  . Number of children: 2  . Years of education: Not on file  . Highest education level: 10th grade  Occupational History  . Occupation: Retired  Scientific laboratory technician  . Financial resource strain: Not hard at all  . Food insecurity:    Worry: Never true    Inability: Never true  . Transportation needs:    Medical: No    Non-medical: No  Tobacco Use  . Smoking status: Never Smoker  . Smokeless tobacco: Never Used  Substance and Sexual Activity  . Alcohol use: No  . Drug use: No  . Sexual activity: Not Currently  Lifestyle  . Physical activity:    Days per week: 0 days    Minutes per session: 0 min  . Stress: Not at all  Relationships  . Social connections:    Talks on phone: More than three times a week    Gets together: More than three times a week    Attends religious service: More than 4 times per year    Active member of club or organization: No    Attends meetings of clubs or organizations: Never    Relationship status: Widowed  Other Topics Concern  . Not on file  Social History Narrative  . Not on file    Activities of Daily Living In your present state of health, do you have any difficulty performing the following activities: 01/21/2019  Hearing? N  Vision? Y  Comment Wears reading glasses  Difficulty concentrating or making decisions? N  Walking or climbing stairs? Y  Comment Walks witha cane  Dressing or bathing? N  Doing errands, shopping? N  Preparing Food and eating ? N  Using the Toilet? N  In the past six months, have you accidently leaked urine? N  Do you have problems with loss of bowel control? N  Managing your Medications? N  Managing your Finances? N  Housekeeping or managing your Housekeeping? N  Some recent data might be hidden     Patient Literacy How often do you need to have someone help you when you read instructions, pamphlets, or other written materials from your doctor or pharmacy?: 1 - Never What is the last grade level you completed in school?: 10th grade  Exercise Current Exercise Habits: The patient does not participate in regular exercise at present, Exercise limited by: orthopedic condition(s)  Diet Patient reports consuming 3 meals a day and 2 snack(s) a day Patient reports that her primary diet is: Regular Patient reports that she does have regular access to food.   Depression Screen PHQ 2/9 Scores 09/19/2018 04/09/2018 03/26/2018 03/17/2018 01/06/2018 10/07/2017 07/04/2017  PHQ - 2 Score 0 0 0 0 0 0  0     Fall Risk Fall Risk  09/19/2018 04/09/2018 03/26/2018 03/17/2018 01/06/2018  Falls in the past year? 0 No No No No  Number falls in past yr: - - - - -  Injury with Fall? - - - - -  Comment - - - - -  Risk Factor Category  - - - - -  Risk for fall due to : - - - - -  Follow up - - - - -     Objective:  Frederich Balding Rasberry seemed alert and oriented and she participated appropriately during our telephone visit.  Blood Pressure Weight BMI  BP Readings from Last 3 Encounters:  09/19/18 (!) 143/70  04/09/18 134/73  03/26/18 138/71   Wt Readings from Last 3 Encounters:  09/19/18 178 lb 3.2 oz (80.8 kg)  04/09/18 180 lb 9.6 oz (81.9 kg)  03/26/18 180 lb 9.6 oz (81.9 kg)   BMI Readings from Last 1 Encounters:  09/19/18 29.65 kg/m    *Unable to obtain current vital signs, weight, and BMI due to telephone visit type  Hearing/Vision  . Chalon did not seem to have difficulty with hearing/understanding during the telephone conversation . Reports that she has had a formal eye exam by an eye care professional within the past year . Reports that she has not had a formal hearing evaluation within the past year *Unable to fully assess hearing and vision during telephone visit type  Cognitive Function:  6CIT Screen 01/21/2019  What Year? 0 points  What month? 0 points  What time? 0 points  Count back from 20 0 points  Months in reverse 0 points  Repeat phrase 0 points  Total Score 0    Normal Cognitive Function Screening: Yes (Normal:0-7, Significant for Dysfunction: >8)  Immunization & Health Maintenance Record Immunization History  Administered Date(s) Administered  . Pneumococcal Conjugate-13 12/31/2016  . Pneumococcal Polysaccharide-23 04/09/2018  . Tdap 12/31/2016    Health Maintenance  Topic Date Due  . HEMOGLOBIN A1C  03/20/2019  . INFLUENZA VACCINE  03/28/2019  . FOOT EXAM  04/10/2019  . URINE MICROALBUMIN  04/10/2019  . OPHTHALMOLOGY EXAM  10/08/2019  . TETANUS/TDAP  01/01/2027  . PNA vac Low Risk Adult  Completed  . DEXA SCAN  Discontinued       Assessment  This is a routine wellness examination for [employer].  Health Maintenance: Due or Overdue There are no preventive care reminders to display for this patient.  Frederich Balding January does not need a referral for [employer] Assistance: Care Management:   no Social Work:    no Prescription Assistance:  no Nutrition/Diabetes Education:  no   Plan:  Personalized Goals Goals Addressed            This Visit's Progress   . Exercise 150 min/wk Moderate Activity      . Have 3 meals a day        Personalized Health Maintenance & Screening Recommendations  Patient declines any further health maintenance items  Lung Cancer Screening Recommended: no (Low Dose CT Chest recommended if Age 85-80 years, 30 pack-year currently smoking OR have quit w/in past 15 years) Hepatitis C Screening recommended: not applicable HIV Screening recommended: no  Advanced Directives: Written information was not prepared per patient's request.  Referrals & Orders No orders of the defined types were placed in this encounter.   Follow-up Plan . Follow-up with Sharion Balloon, FNP as planned    I have personally  reviewed and noted the following in the patient's chart:   . Medical and social history . Use of alcohol, tobacco or illicit drugs  . Current medications and supplements . Functional ability and status . Nutritional status . Physical activity . Advanced directives . List of other physicians . Hospitalizations, surgeries, and ER visits in previous 12 months . Vitals . Screenings to include cognitive, depression, and falls . Referrals and appointments  In addition, I have reviewed and discussed with Gabriella Ferguson certain preventive protocols, quality metrics, and best practice recommendations. A written personalized care plan for preventive services as well as general preventive health recommendations is available and can be mailed to the patient at her request.      Rolena Infante  01/21/2019

## 2019-02-16 ENCOUNTER — Other Ambulatory Visit: Payer: Self-pay | Admitting: Family

## 2019-02-16 DIAGNOSIS — G629 Polyneuropathy, unspecified: Secondary | ICD-10-CM

## 2019-02-16 DIAGNOSIS — M810 Age-related osteoporosis without current pathological fracture: Secondary | ICD-10-CM

## 2019-03-17 ENCOUNTER — Other Ambulatory Visit: Payer: Self-pay | Admitting: *Deleted

## 2019-03-17 ENCOUNTER — Other Ambulatory Visit: Payer: Self-pay | Admitting: Family

## 2019-03-17 DIAGNOSIS — I10 Essential (primary) hypertension: Secondary | ICD-10-CM

## 2019-03-17 DIAGNOSIS — E1169 Type 2 diabetes mellitus with other specified complication: Secondary | ICD-10-CM

## 2019-03-17 DIAGNOSIS — E119 Type 2 diabetes mellitus without complications: Secondary | ICD-10-CM

## 2019-03-17 DIAGNOSIS — E785 Hyperlipidemia, unspecified: Secondary | ICD-10-CM

## 2019-03-17 MED ORDER — SIMVASTATIN 20 MG PO TABS: 20.0000 mg | ORAL_TABLET | Freq: Every day | ORAL | 0 refills | Status: DC

## 2019-03-17 MED ORDER — HYDROCHLOROTHIAZIDE 12.5 MG PO CAPS: 12.5000 mg | ORAL_CAPSULE | Freq: Every day | ORAL | 0 refills | Status: DC

## 2019-03-19 ENCOUNTER — Other Ambulatory Visit: Payer: Self-pay

## 2019-03-20 ENCOUNTER — Ambulatory Visit (INDEPENDENT_AMBULATORY_CARE_PROVIDER_SITE_OTHER): Payer: Medicare Other | Admitting: Family

## 2019-03-20 ENCOUNTER — Encounter: Payer: Self-pay | Admitting: Family

## 2019-03-20 VITALS — BP 149/65 | HR 85 | Temp 96.4°F | Ht 60.0 in | Wt 181.2 lb

## 2019-03-20 DIAGNOSIS — R309 Painful micturition, unspecified: Secondary | ICD-10-CM

## 2019-03-20 DIAGNOSIS — I10 Essential (primary) hypertension: Secondary | ICD-10-CM

## 2019-03-20 DIAGNOSIS — E1169 Type 2 diabetes mellitus with other specified complication: Secondary | ICD-10-CM | POA: Diagnosis not present

## 2019-03-20 DIAGNOSIS — K219 Gastro-esophageal reflux disease without esophagitis: Secondary | ICD-10-CM

## 2019-03-20 DIAGNOSIS — E1165 Type 2 diabetes mellitus with hyperglycemia: Secondary | ICD-10-CM

## 2019-03-20 DIAGNOSIS — N3001 Acute cystitis with hematuria: Secondary | ICD-10-CM | POA: Diagnosis not present

## 2019-03-20 DIAGNOSIS — K5900 Constipation, unspecified: Secondary | ICD-10-CM

## 2019-03-20 DIAGNOSIS — E785 Hyperlipidemia, unspecified: Secondary | ICD-10-CM

## 2019-03-20 DIAGNOSIS — M8000XD Age-related osteoporosis with current pathological fracture, unspecified site, subsequent encounter for fracture with routine healing: Secondary | ICD-10-CM

## 2019-03-20 LAB — MICROSCOPIC EXAMINATION: WBC, UA: >30 /hpf — AB (ref 0–5)

## 2019-03-20 LAB — URINALYSIS, COMPLETE
Bilirubin, UA: NEGATIVE
Glucose, UA: NEGATIVE
Ketones, UA: NEGATIVE
Nitrite, UA: NEGATIVE
Specific Gravity, UA: >1.03 — ABNORMAL HIGH (ref 1.005–1.030)
Urobilinogen, Ur: 0.2 mg/dL (ref 0.2–1.0)
pH, UA: 5 (ref 5.0–7.5)

## 2019-03-20 MED ORDER — CEPHALEXIN 500 MG PO CAPS: 500.0000 mg | ORAL_CAPSULE | Freq: Two times a day (BID) | ORAL | 0 refills | Status: DC

## 2019-03-20 MED ORDER — MUPIROCIN 2 % EX OINT: 1.0000 "application " | TOPICAL_OINTMENT | Freq: Two times a day (BID) | CUTANEOUS | 0 refills | Status: DC

## 2019-03-20 NOTE — Patient Instructions (Signed)

## 2019-03-20 NOTE — Progress Notes (Signed)
 Subjective:    Patient ID: Gabriella Ferguson, female    DOB: 03/10/1929, 85 y.o.   MRN: [id number]  Chief Complaint  Patient presents with  . Medical Management of Chronic Issues   Pt presents to the office today for chronic follow up.  Hypertension This is a chronic problem. The current episode started more than 1 year ago. The problem has been resolved since onset. The problem is controlled. Associated symptoms include malaise/fatigue. Pertinent negatives include no blurred vision, peripheral edema or shortness of breath. Risk factors for coronary artery disease include diabetes mellitus and dyslipidemia. The current treatment provides moderate improvement. There is no history of kidney disease, CAD/MI, CVA or heart failure.  Diabetes She presents for her follow-up diabetic visit. She has type 2 diabetes mellitus. Her disease course has been stable. There are no hypoglycemic associated symptoms. Pertinent negatives for diabetes include no blurred vision, no foot paresthesias and no visual change. There are no hypoglycemic complications. Symptoms are stable. Pertinent negatives for diabetic complications include no CVA or heart disease. Risk factors for coronary artery disease include diabetes mellitus, dyslipidemia, hypertension, sedentary lifestyle and post-menopausal. She is following a generally healthy diet. (Does not check BS at home ) Eye exam is current.  Hyperlipidemia This is a chronic problem. The current episode started more than 1 year ago. The problem is controlled. Recent lipid tests were reviewed and are normal. Exacerbating diseases include obesity. Pertinent negatives include no shortness of breath. Current antihyperlipidemic treatment includes statins. The current treatment provides moderate improvement of lipids. Risk factors for coronary artery disease include dyslipidemia, diabetes mellitus, female sex, hypertension and a sedentary lifestyle.  Gastroesophageal Reflux She  complains of heartburn. She reports no belching, no coughing or no nausea. This is a chronic problem. The current episode started more than 1 year ago. The problem occurs rarely. The problem has been waxing and waning. Risk factors include obesity. She has tried a PPI for the symptoms. The treatment provided moderate relief.  Constipation This is a chronic problem. The current episode started more than 1 year ago. The problem has been waxing and waning since onset. Her stool frequency is 1 time per day. Pertinent negatives include no nausea or vomiting. She has tried laxatives for the symptoms. The treatment provided moderate relief.  Dysuria  This is a new problem. The current episode started in the past 7 days. The problem occurs every urination. The problem has been waxing and waning. The quality of the pain is described as burning. The pain is mild. Associated symptoms include frequency, hesitancy and urgency. Pertinent negatives include no hematuria, nausea or vomiting. She has tried increased fluids for the symptoms. The treatment provided no relief.  Osteoporosis  Pt takes Fosamax. Can not remember her last Dexa scan.     Review of Systems  Constitutional: Positive for malaise/fatigue.  Eyes: Negative for blurred vision.  Respiratory: Negative for cough and shortness of breath.   Gastrointestinal: Positive for constipation and heartburn. Negative for nausea and vomiting.  Genitourinary: Positive for dysuria, frequency, hesitancy and urgency. Negative for hematuria.       Objective:   Physical Exam Vitals signs reviewed.  Constitutional:      General: She is not in acute distress.    Appearance: She is well-developed.  HENT:     Head: Normocephalic and atraumatic.     Right Ear: Tympanic membrane normal.     Left Ear: Tympanic membrane normal.  Eyes:     Pupils: Pupils   are equal, round, and reactive to light.  Neck:     Musculoskeletal: Normal range of motion and neck supple.      Thyroid: No thyromegaly.  Cardiovascular:     Rate and Rhythm: Normal rate and regular rhythm.     Heart sounds: Normal heart sounds. No murmur.  Pulmonary:     Effort: Pulmonary effort is normal. No respiratory distress.     Breath sounds: Normal breath sounds. No wheezing.  Abdominal:     General: Bowel sounds are normal. There is no distension.     Palpations: Abdomen is soft.     Tenderness: There is no abdominal tenderness.  Musculoskeletal: Normal range of motion.        General: No tenderness.     Right lower leg: Edema (trace) present.     Left lower leg: Edema (trace) present.  Skin:    General: Skin is warm and dry.  Neurological:     Mental Status: She is alert and oriented to person, place, and time.     Cranial Nerves: No cranial nerve deficit.     Deep Tendon Reflexes: Reflexes are normal and symmetric.  Psychiatric:        Behavior: Behavior normal.        Thought Content: Thought content normal.        Judgment: Judgment normal.       BP (!) 149/65   Pulse 85   Temp (!) 96.4 F (35.8 C) (Oral)   Ht 5' (1.524 m)   Wt 181 lb 3.2 oz (82.2 kg)   BMI 35.39 kg/m      Assessment & Plan:  Gabriella Ferguson comes in today with chief complaint of Medical Management of Chronic Issues   Diagnosis and orders addressed:  1. Pain passing urine - Urinalysis, Complete - CMP14+EGFR - CBC with Differential/Platelet  2. Essential hypertension - CMP14+EGFR - CBC with Differential/Platelet  3. Gastroesophageal reflux disease, esophagitis presence not specified  - CMP14+EGFR - CBC with Differential/Platelet  4. Hyperlipidemia associated with type 2 diabetes mellitus (HCC) - CMP14+EGFR - CBC with Differential/Platelet  5. Type 2 diabetes mellitus with hyperglycemia, without long-term current use of insulin (HCC) - Bayer DCA Hb A1c Waived - CMP14+EGFR - CBC with Differential/Platelet - Microalbumin / creatinine urine ratio  6. Constipation, unspecified  constipation type - CMP14+EGFR - CBC with Differential/Platelet  7. Osteoporosis with current pathological fracture with routine healing, unspecified osteoporosis type, subsequent encounter - CMP14+EGFR - CBC with Differential/Platelet - DG [facility] DEXA  8. Acute cystitis with hematuria - cephALEXin (KEFLEX) 500 MG capsule; Take 1 capsule (500 mg total) by mouth 2 (two) times daily.  Dispense: 14 capsule; Refill: 0 - Urine Culture   Labs pending Health Maintenance reviewed Diet and exercise encouraged  Follow up plan: [place], FNP

## 2019-03-21 LAB — MICROALBUMIN / CREATININE URINE RATIO
Creatinine, Urine: 118 mg/dL
Microalb/Creat Ratio: 61 mg/g creat — ABNORMAL HIGH (ref 0–29)
Microalbumin, Urine: 72 ug/mL

## 2019-03-23 LAB — URINE CULTURE

## 2019-03-31 ENCOUNTER — Other Ambulatory Visit: Payer: Self-pay | Admitting: Family

## 2019-03-31 DIAGNOSIS — N3001 Acute cystitis with hematuria: Secondary | ICD-10-CM

## 2019-04-01 ENCOUNTER — Telehealth: Payer: Self-pay | Admitting: Family

## 2019-04-01 ENCOUNTER — Other Ambulatory Visit: Payer: Self-pay | Admitting: Family Medicine

## 2019-04-01 MED ORDER — CEPHALEXIN 250 MG PO CAPS: 250.0000 mg | ORAL_CAPSULE | Freq: Four times a day (QID) | ORAL | 0 refills | Status: AC

## 2019-04-01 NOTE — Telephone Encounter (Signed)
What symptoms do you have? Burning and kidney smell strong. When she stop taking ABX it flared back up again.Wants another refill on Cephalexin 500 mg  How long have you been sick? 2 weeks  Have you been seen for this problem? yes  If your provider decides to give you a prescription, which pharmacy would you like for it to be sent to? [facility]   Patient informed that this information will be sent to the clinical staff for review and that they should receive a follow up call.

## 2019-04-01 NOTE — Telephone Encounter (Signed)
I have renewed Keflex but sent in the 250mg  which I want her to take FOUR times daily instead of twice daily.  Hopefully, the increased frequency of antibiotic will wipe out her infection. Her UTI should respond to this medication.  If it does not, have her be seen for a repeat urine sample and possible shot in office.

## 2019-04-01 NOTE — Telephone Encounter (Signed)
Pt aware.

## 2019-05-08 IMAGING — DX DG HAND COMPLETE 3+V*L*
3 series · 3 of 3 positions shown · non-contrast
Comparison: None.

CLINICAL DATA: Fall on outstretched hand with pain and bruising
left hand/wrist.

EXAM:
LEFT HAND - COMPLETE 3+ VIEW

[hand pa]
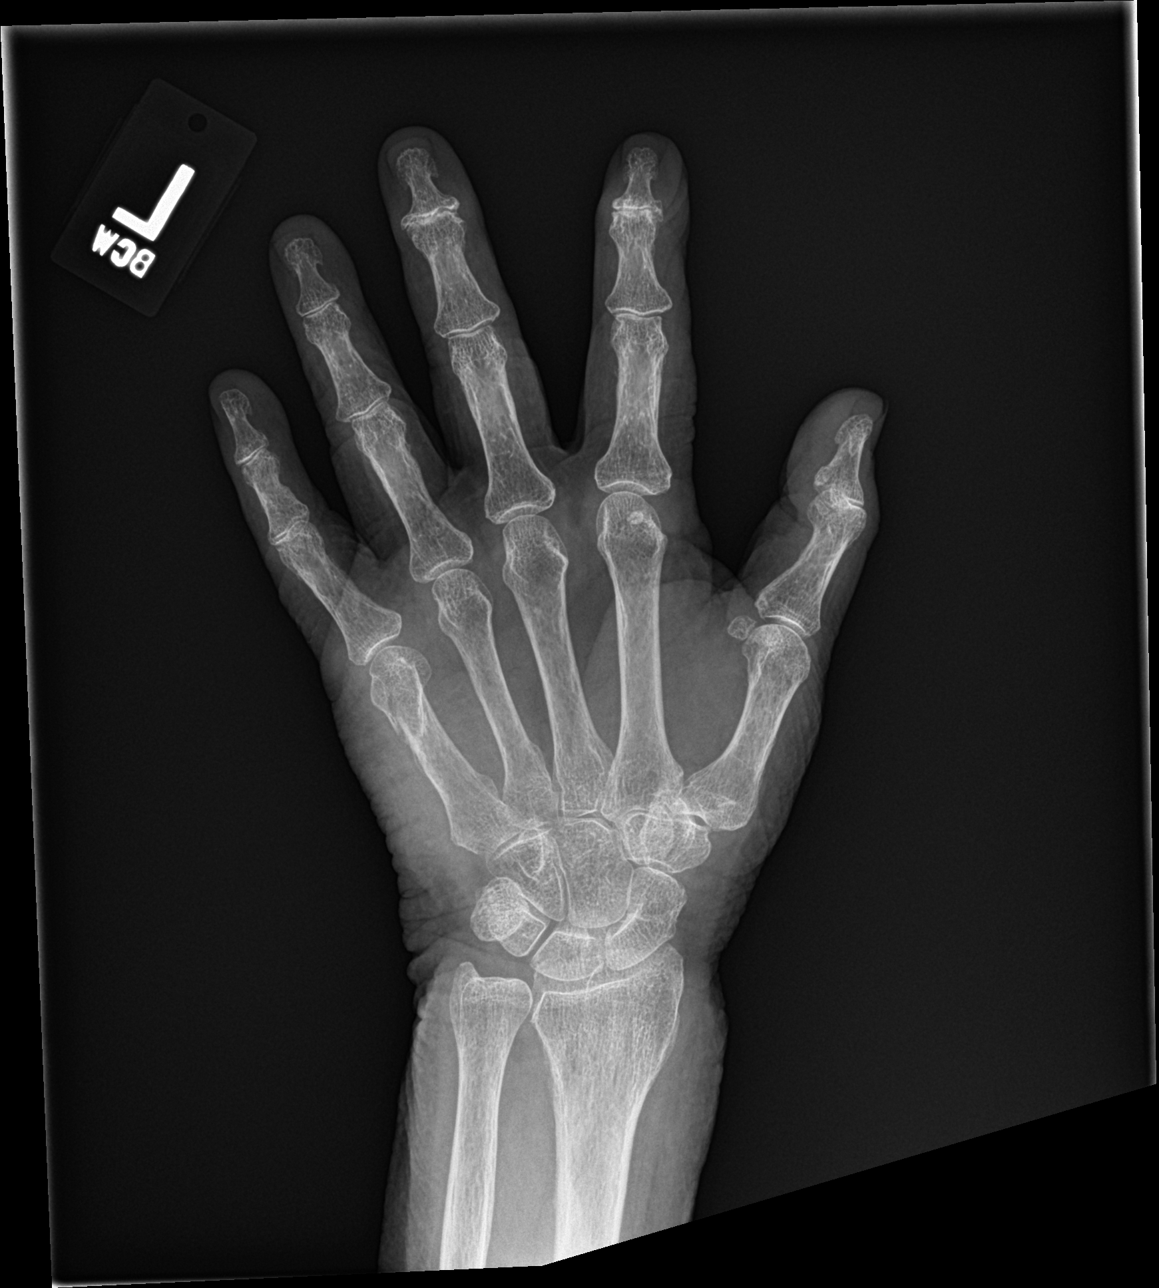

[hand obl]
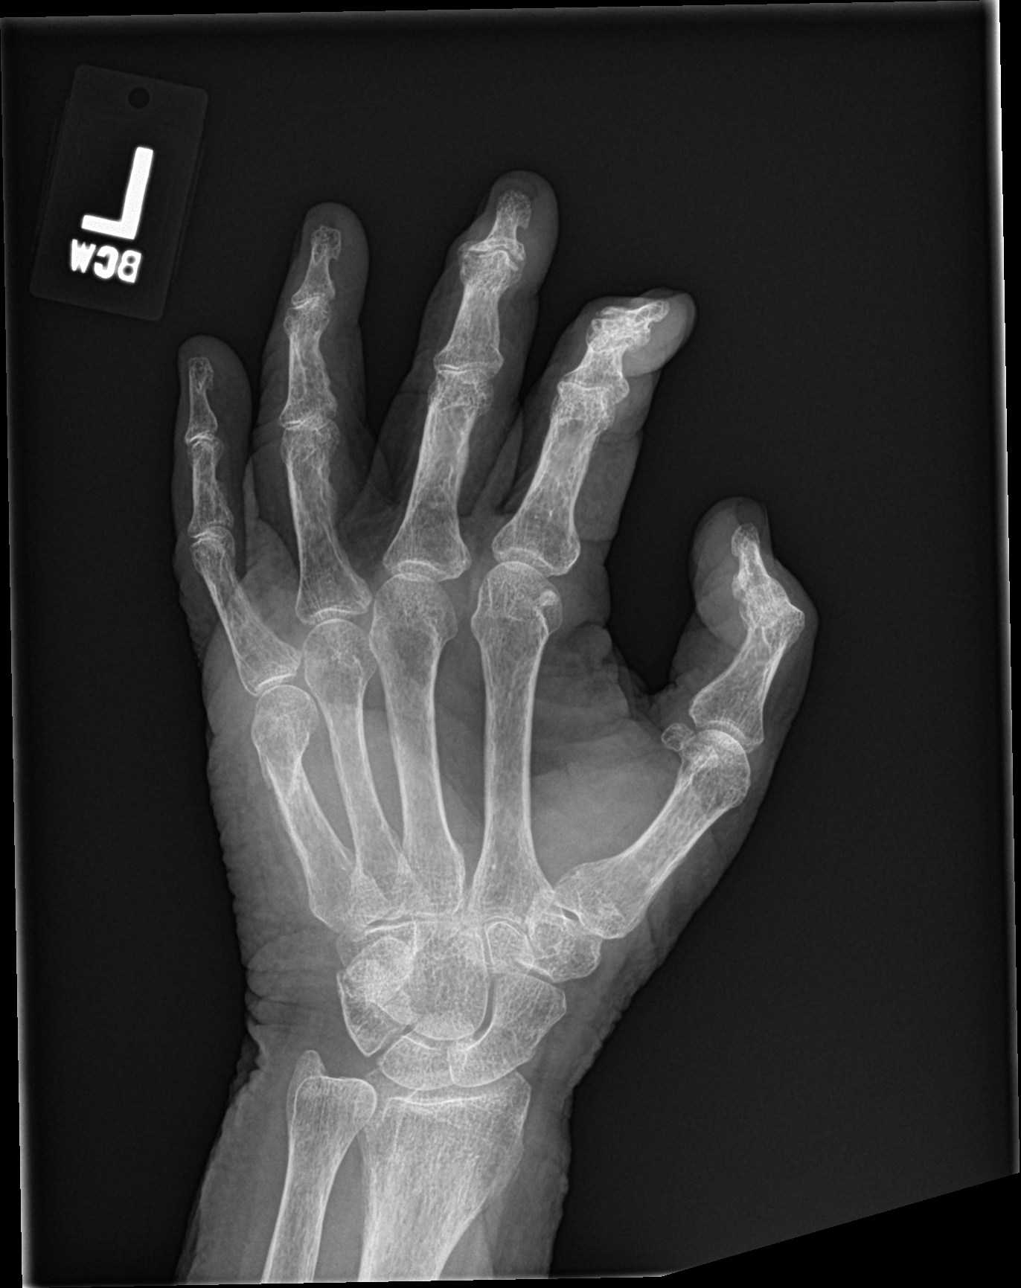

[hand lat]
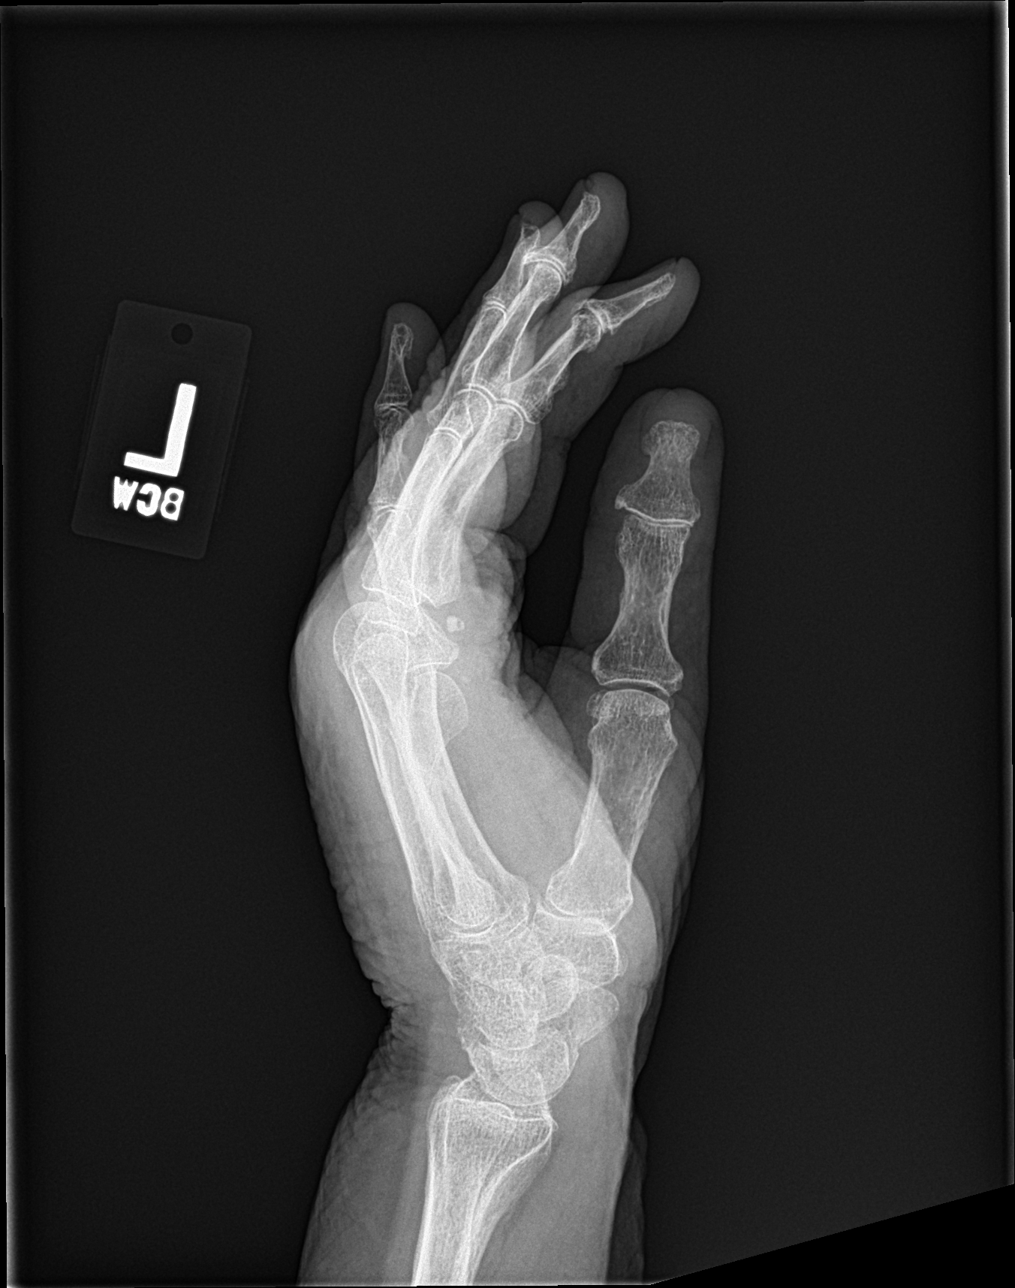

[3 of 3 positions shown; findings below may reference images not displayed]

FINDINGS: Diffuse osteopenia. Minimal degenerate change of the radiocarpal
joint and carpal bones. Mild degenerate change of the
interphalangeal joints. There is a minimally displaced oblique
fracture involving the mid to distal aspect of the fifth metacarpal.
IMPRESSION: Minimally displaced oblique fracture of the mid to distal aspect of
the fifth metacarpal.

Osteopenia with degenerative changes as described.

## 2019-05-12 ENCOUNTER — Encounter: Payer: Self-pay | Admitting: Family

## 2019-05-12 ENCOUNTER — Ambulatory Visit (INDEPENDENT_AMBULATORY_CARE_PROVIDER_SITE_OTHER): Payer: Medicare Other | Admitting: Family

## 2019-05-12 ENCOUNTER — Other Ambulatory Visit: Payer: Self-pay | Admitting: Family

## 2019-05-12 DIAGNOSIS — N3001 Acute cystitis with hematuria: Secondary | ICD-10-CM

## 2019-05-12 DIAGNOSIS — M810 Age-related osteoporosis without current pathological fracture: Secondary | ICD-10-CM

## 2019-05-12 DIAGNOSIS — G629 Polyneuropathy, unspecified: Secondary | ICD-10-CM

## 2019-05-12 LAB — URINALYSIS, COMPLETE
Bilirubin, UA: NEGATIVE
Ketones, UA: NEGATIVE
Nitrite, UA: NEGATIVE
Specific Gravity, UA: 1.025 (ref 1.005–1.030)
Urobilinogen, Ur: 0.2 mg/dL (ref 0.2–1.0)
pH, UA: 5.5 (ref 5.0–7.5)

## 2019-05-12 LAB — MICROSCOPIC EXAMINATION: WBC, UA: >30 /hpf — AB (ref 0–5)

## 2019-05-12 MED ORDER — CEPHALEXIN 500 MG PO CAPS: 500.0000 mg | ORAL_CAPSULE | Freq: Two times a day (BID) | ORAL | 0 refills | Status: DC

## 2019-05-12 NOTE — Progress Notes (Signed)
   Virtual Visit via telephone Note Due to COVID-19 pandemic this visit was conducted virtually. This visit type was conducted due to national recommendations for restrictions regarding the COVID-19 Pandemic (e.g. social distancing, sheltering in place) in an effort to limit this patient's exposure and mitigate transmission in our community. All issues noted in this document were discussed and addressed.  A physical exam was not performed with this format.  I connected with Gabriella Balding [place] on 05/12/19 at 12:16 pm by telephone and verified that I am speaking with the correct person using two identifiers. Gabriella Ferguson is currently located at home and no one is currently with her during visit. The provider, [name], FNP is located in their office at time of visit.  I discussed the limitations, risks, security and privacy concerns of performing an evaluation and management service by telephone and the availability of in person appointments. I also discussed with the patient that there may be a patient responsible charge related to this service. The patient expressed understanding and agreed to proceed.   History and Present Illness:  Dysuria  This is a new problem. The current episode started in the past 7 days. The problem occurs intermittently. The problem has been gradually worsening. The quality of the pain is described as burning. The pain is at a severity of 5/10. Associated symptoms include flank pain, frequency, hesitancy and urgency. Pertinent negatives include no hematuria or nausea. She has tried increased fluids for the symptoms. The treatment provided no relief.      Review of Systems  Gastrointestinal: Negative for nausea.  Genitourinary: Positive for dysuria, flank pain, frequency, hesitancy and urgency. Negative for hematuria.  All other systems reviewed and are negative.    Observations/Objective: No SOB or distress noted  Assessment and Plan: 1. Acute cystitis  with hematuria Force fluids AZO over the counter X2 days RTO prn Culture pending - Urinalysis, Complete - Urine Culture - cephALEXin (KEFLEX) 500 MG capsule; Take 1 capsule (500 mg total) by mouth 2 (two) times daily.  Dispense: 14 capsule; Refill: 0     I discussed the assessment and treatment plan with the patient. The patient was provided an opportunity to ask questions and all were answered. The patient agreed with the plan and demonstrated an understanding of the instructions.   The patient was advised to call back or seek an in-person evaluation if the symptoms worsen or if the condition fails to improve as anticipated.  The above assessment and management plan was discussed with the patient. The patient verbalized understanding of and has agreed to the management plan. Patient is aware to call the clinic if symptoms persist or worsen. Patient is aware when to return to the clinic for a follow-up visit. Patient educated on when it is appropriate to go to the emergency department.   Time call ended:  12:25 pm  I provided 9 minutes of non-face-to-face time during this encounter.    [name], FNP

## 2019-05-14 LAB — URINE CULTURE

## 2019-05-29 ENCOUNTER — Ambulatory Visit: Payer: Medicare Other | Admitting: Family

## 2019-06-11 ENCOUNTER — Other Ambulatory Visit: Payer: Self-pay

## 2019-06-12 ENCOUNTER — Encounter: Payer: Self-pay | Admitting: Family

## 2019-06-12 ENCOUNTER — Ambulatory Visit (INDEPENDENT_AMBULATORY_CARE_PROVIDER_SITE_OTHER): Payer: Medicare Other | Admitting: Family

## 2019-06-12 VITALS — BP 151/69 | HR 100 | Temp 97.1°F | Ht 60.0 in | Wt 182.2 lb

## 2019-06-12 DIAGNOSIS — R309 Painful micturition, unspecified: Secondary | ICD-10-CM | POA: Diagnosis not present

## 2019-06-12 DIAGNOSIS — K219 Gastro-esophageal reflux disease without esophagitis: Secondary | ICD-10-CM | POA: Diagnosis not present

## 2019-06-12 DIAGNOSIS — E1169 Type 2 diabetes mellitus with other specified complication: Secondary | ICD-10-CM | POA: Diagnosis not present

## 2019-06-12 DIAGNOSIS — N3000 Acute cystitis without hematuria: Secondary | ICD-10-CM | POA: Diagnosis not present

## 2019-06-12 DIAGNOSIS — R300 Dysuria: Secondary | ICD-10-CM

## 2019-06-12 DIAGNOSIS — E1165 Type 2 diabetes mellitus with hyperglycemia: Secondary | ICD-10-CM | POA: Diagnosis not present

## 2019-06-12 DIAGNOSIS — I10 Essential (primary) hypertension: Secondary | ICD-10-CM | POA: Diagnosis not present

## 2019-06-12 LAB — URINALYSIS, COMPLETE
Bilirubin, UA: NEGATIVE
Ketones, UA: NEGATIVE
Leukocytes,UA: NEGATIVE
Nitrite, UA: POSITIVE — AB
Protein,UA: NEGATIVE
Specific Gravity, UA: >1.03 — ABNORMAL HIGH (ref 1.005–1.030)
Urobilinogen, Ur: 0.2 mg/dL (ref 0.2–1.0)
pH, UA: 7 (ref 5.0–7.5)

## 2019-06-12 LAB — MICROSCOPIC EXAMINATION

## 2019-06-12 LAB — BAYER DCA HB A1C WAIVED: HB A1C (BAYER DCA - WAIVED): 10.1 % — ABNORMAL HIGH (ref <7.0)

## 2019-06-12 MED ORDER — SULFAMETHOXAZOLE-TRIMETHOPRIM 800-160 MG PO TABS: 1.0000 | ORAL_TABLET | Freq: Two times a day (BID) | ORAL | 0 refills | Status: DC

## 2019-06-12 NOTE — Progress Notes (Signed)
   Subjective:    Patient ID: Gabriella Ferguson, female    DOB: 1929-07-12, 85 y.o.   MRN: [id number]  Dysuria  This is a recurrent problem. The current episode started 1 to 4 weeks ago. The problem occurs every urination. The problem has been gradually worsening. The quality of the pain is described as burning. The pain is at a severity of 4/10. The pain is mild. Associated symptoms include frequency (2-3 at night), hesitancy and urgency. Pertinent negatives include no flank pain, hematuria, nausea or vomiting. She has tried increased fluids for the symptoms.      Review of Systems  Gastrointestinal: Negative for nausea and vomiting.  Genitourinary: Positive for dysuria, frequency (2-3 at night), hesitancy and urgency. Negative for flank pain and hematuria.  All other systems reviewed and are negative.      Objective:   Physical Exam Vitals signs reviewed.  Constitutional:      General: She is not in acute distress.    Appearance: She is well-developed.  HENT:     Head: Normocephalic and atraumatic.  Eyes:     Pupils: Pupils are equal, round, and reactive to light.  Neck:     Musculoskeletal: Normal range of motion and neck supple.     Thyroid: No thyromegaly.  Cardiovascular:     Rate and Rhythm: Normal rate and regular rhythm.     Heart sounds: Normal heart sounds. No murmur.  Pulmonary:     Effort: Pulmonary effort is normal. No respiratory distress.     Breath sounds: Normal breath sounds. No wheezing.  Abdominal:     General: Bowel sounds are normal. There is no distension.     Palpations: Abdomen is soft.     Tenderness: There is no abdominal tenderness (no tenderenss noted). There is no right CVA tenderness or left CVA tenderness.  Musculoskeletal: Normal range of motion.        General: No tenderness.  Skin:    General: Skin is warm and dry.  Neurological:     Mental Status: She is alert and oriented to person, place, and time.     Deep Tendon Reflexes: Reflexes  are normal and symmetric.  Psychiatric:        Behavior: Behavior normal.        Thought Content: Thought content normal.        Judgment: Judgment normal.          BP (!) 151/69   Pulse 100   Temp (!) 97.1 F (36.2 C) (Temporal)   Ht 5' (1.524 m)   Wt 182 lb 3.2 oz (82.6 kg)   SpO2 97%   BMI 35.58 kg/m   Assessment & Plan:  Gabriella Ferguson comes in today with chief complaint of Dysuria   Diagnosis and orders addressed:  1. Dysuria - Urinalysis, Complete  2. Acute cystitis without hematuria Force fluids RTO if symptoms worsen or do not improve  Culture pending - Urine Culture - sulfamethoxazole-trimethoprim (BACTRIM DS) 800-160 MG tablet; Take 1 tablet by mouth 2 (two) times daily.  Dispense: 14 tablet; Refill: 0  Gabriella Dun, FNP

## 2019-06-12 NOTE — Patient Instructions (Signed)

## 2019-06-13 LAB — CBC WITH DIFFERENTIAL/PLATELET
Basophils Absolute: 0 10*3/uL (ref 0.0–0.2)
Basos: 0 %
EOS (ABSOLUTE): 0.1 10*3/uL (ref 0.0–0.4)
Eos: 1 %
Hematocrit: 35.4 % (ref 34.0–46.6)
Hemoglobin: 11.2 g/dL (ref 11.1–15.9)
Immature Grans (Abs): 0 10*3/uL (ref 0.0–0.1)
Immature Granulocytes: 0 %
Lymphocytes Absolute: 2.1 10*3/uL (ref 0.7–3.1)
Lymphs: 29 %
MCH: 27.3 pg (ref 26.6–33.0)
MCHC: 31.6 g/dL (ref 31.5–35.7)
MCV: 86 fL (ref 79–97)
Monocytes Absolute: 0.5 10*3/uL (ref 0.1–0.9)
Monocytes: 6 %
Neutrophils Absolute: 4.6 10*3/uL (ref 1.4–7.0)
Neutrophils: 64 %
Platelets: 225 10*3/uL (ref 150–450)
RBC: 4.11 x10E6/uL (ref 3.77–5.28)
RDW: 13.1 % (ref 11.7–15.4)
WBC: 7.3 10*3/uL (ref 3.4–10.8)

## 2019-06-13 LAB — CMP14+EGFR
ALT: 11 IU/L (ref 0–32)
AST: 13 IU/L (ref 0–40)
Albumin/Globulin Ratio: 1.7 (ref 1.2–2.2)
Albumin: 4.2 g/dL (ref 3.6–4.6)
Alkaline Phosphatase: 53 IU/L (ref 39–117)
BUN/Creatinine Ratio: 14 (ref 12–28)
BUN: 15 mg/dL (ref 8–27)
Bilirubin Total: 0.3 mg/dL (ref 0.0–1.2)
CO2: 24 mmol/L (ref 20–29)
Calcium: 9.4 mg/dL (ref 8.7–10.3)
Chloride: 100 mmol/L (ref 96–106)
Creatinine, Ser: 1.1 mg/dL — ABNORMAL HIGH (ref 0.57–1.00)
GFR calc Af Amer: 52 mL/min/{1.73_m2} — ABNORMAL LOW (ref >59)
GFR calc non Af Amer: 45 mL/min/{1.73_m2} — ABNORMAL LOW (ref >59)
Globulin, Total: 2.5 g/dL (ref 1.5–4.5)
Glucose: 268 mg/dL — ABNORMAL HIGH (ref 65–99)
Potassium: 4.8 mmol/L (ref 3.5–5.2)
Sodium: 138 mmol/L (ref 134–144)
Total Protein: 6.7 g/dL (ref 6.0–8.5)

## 2019-06-13 LAB — URINE CULTURE: Organism ID, Bacteria: NO GROWTH

## 2019-06-15 ENCOUNTER — Other Ambulatory Visit: Payer: Self-pay | Admitting: Family

## 2019-06-15 DIAGNOSIS — I10 Essential (primary) hypertension: Secondary | ICD-10-CM

## 2019-06-15 DIAGNOSIS — E1169 Type 2 diabetes mellitus with other specified complication: Secondary | ICD-10-CM

## 2019-06-15 DIAGNOSIS — E785 Hyperlipidemia, unspecified: Secondary | ICD-10-CM

## 2019-06-15 DIAGNOSIS — E119 Type 2 diabetes mellitus without complications: Secondary | ICD-10-CM

## 2019-06-16 ENCOUNTER — Telehealth: Payer: Self-pay | Admitting: Family

## 2019-06-16 ENCOUNTER — Other Ambulatory Visit: Payer: Self-pay | Admitting: Family

## 2019-06-16 DIAGNOSIS — E1151 Type 2 diabetes mellitus with diabetic peripheral angiopathy without gangrene: Secondary | ICD-10-CM

## 2019-06-16 DIAGNOSIS — IMO0002 Reserved for concepts with insufficient information to code with codable children: Secondary | ICD-10-CM

## 2019-06-16 DIAGNOSIS — E1165 Type 2 diabetes mellitus with hyperglycemia: Secondary | ICD-10-CM

## 2019-06-16 MED ORDER — TRESIBA FLEXTOUCH 100 UNIT/ML ~~LOC~~ SOPN: 5.0000 [IU] | PEN_INJECTOR | Freq: Every day | SUBCUTANEOUS | 3 refills | Status: DC

## 2019-06-17 ENCOUNTER — Other Ambulatory Visit: Payer: Self-pay | Admitting: *Deleted

## 2019-06-17 DIAGNOSIS — E1151 Type 2 diabetes mellitus with diabetic peripheral angiopathy without gangrene: Secondary | ICD-10-CM

## 2019-06-17 DIAGNOSIS — E1165 Type 2 diabetes mellitus with hyperglycemia: Secondary | ICD-10-CM

## 2019-06-17 DIAGNOSIS — IMO0002 Reserved for concepts with insufficient information to code with codable children: Secondary | ICD-10-CM

## 2019-06-17 NOTE — Telephone Encounter (Signed)
Patient takes all medications.  Her daughter gets her pills together for her.  She is not interested in diabetic education because she has tried that.  She worries that her daughter may not be able to get off from work for an endocrinology appointment if it is not close by.  Endocrinology referral ordered.

## 2019-06-17 NOTE — Telephone Encounter (Signed)
FYI for provider

## 2019-06-17 NOTE — Telephone Encounter (Signed)
Ok, can we verify that she is taking her metformin 1000 mg BID, glimepiride 4 mg, and  Januvia 100 mg every day? I have placed a referral for diabetic education please follow up with this. If no improvement will need to do Endocrinologist referral.

## 2019-08-04 ENCOUNTER — Ambulatory Visit: Payer: Self-pay | Admitting: Nutrition

## 2019-08-10 ENCOUNTER — Other Ambulatory Visit: Payer: Self-pay | Admitting: Family

## 2019-08-10 DIAGNOSIS — M810 Age-related osteoporosis without current pathological fracture: Secondary | ICD-10-CM

## 2019-08-10 DIAGNOSIS — G629 Polyneuropathy, unspecified: Secondary | ICD-10-CM

## 2019-08-11 NOTE — Telephone Encounter (Signed)
Ov 09/21/19

## 2019-09-14 ENCOUNTER — Other Ambulatory Visit: Payer: Self-pay | Admitting: Family

## 2019-09-14 DIAGNOSIS — E119 Type 2 diabetes mellitus without complications: Secondary | ICD-10-CM

## 2019-09-14 DIAGNOSIS — I10 Essential (primary) hypertension: Secondary | ICD-10-CM

## 2019-09-14 DIAGNOSIS — E785 Hyperlipidemia, unspecified: Secondary | ICD-10-CM

## 2019-09-14 DIAGNOSIS — E1169 Type 2 diabetes mellitus with other specified complication: Secondary | ICD-10-CM

## 2019-09-18 ENCOUNTER — Other Ambulatory Visit: Payer: Self-pay

## 2019-09-21 ENCOUNTER — Other Ambulatory Visit: Payer: Self-pay

## 2019-09-21 ENCOUNTER — Ambulatory Visit (INDEPENDENT_AMBULATORY_CARE_PROVIDER_SITE_OTHER): Payer: Medicare Other | Admitting: Family

## 2019-09-21 ENCOUNTER — Ambulatory Visit: Payer: Medicare Other

## 2019-09-21 ENCOUNTER — Encounter: Payer: Self-pay | Admitting: Family

## 2019-09-21 ENCOUNTER — Telehealth: Payer: Self-pay | Admitting: *Deleted

## 2019-09-21 VITALS — BP 159/62 | HR 73 | Temp 97.3°F | Ht 60.0 in | Wt 179.4 lb

## 2019-09-21 DIAGNOSIS — R350 Frequency of micturition: Secondary | ICD-10-CM

## 2019-09-21 DIAGNOSIS — I10 Essential (primary) hypertension: Secondary | ICD-10-CM | POA: Diagnosis not present

## 2019-09-21 DIAGNOSIS — E785 Hyperlipidemia, unspecified: Secondary | ICD-10-CM

## 2019-09-21 DIAGNOSIS — M810 Age-related osteoporosis without current pathological fracture: Secondary | ICD-10-CM

## 2019-09-21 DIAGNOSIS — G629 Polyneuropathy, unspecified: Secondary | ICD-10-CM

## 2019-09-21 DIAGNOSIS — N3001 Acute cystitis with hematuria: Secondary | ICD-10-CM | POA: Diagnosis not present

## 2019-09-21 DIAGNOSIS — K219 Gastro-esophageal reflux disease without esophagitis: Secondary | ICD-10-CM

## 2019-09-21 DIAGNOSIS — E1165 Type 2 diabetes mellitus with hyperglycemia: Secondary | ICD-10-CM

## 2019-09-21 DIAGNOSIS — E1169 Type 2 diabetes mellitus with other specified complication: Secondary | ICD-10-CM | POA: Diagnosis not present

## 2019-09-21 DIAGNOSIS — E119 Type 2 diabetes mellitus without complications: Secondary | ICD-10-CM

## 2019-09-21 DIAGNOSIS — K5900 Constipation, unspecified: Secondary | ICD-10-CM

## 2019-09-21 LAB — URINALYSIS, COMPLETE
Bilirubin, UA: NEGATIVE
Glucose, UA: NEGATIVE
Ketones, UA: NEGATIVE
Nitrite, UA: NEGATIVE
Specific Gravity, UA: 1.02 (ref 1.005–1.030)
Urobilinogen, Ur: 0.2 mg/dL (ref 0.2–1.0)
pH, UA: 6 (ref 5.0–7.5)

## 2019-09-21 LAB — BAYER DCA HB A1C WAIVED: HB A1C (BAYER DCA - WAIVED): 9.5 % — ABNORMAL HIGH (ref <7.0)

## 2019-09-21 LAB — MICROSCOPIC EXAMINATION

## 2019-09-21 MED ORDER — GLIMEPIRIDE 4 MG PO TABS: 4.0000 mg | ORAL_TABLET | Freq: Every day | ORAL | 2 refills | Status: DC

## 2019-09-21 MED ORDER — SIMVASTATIN 20 MG PO TABS: 20.0000 mg | ORAL_TABLET | Freq: Every day | ORAL | 3 refills | Status: DC

## 2019-09-21 MED ORDER — HYDROCHLOROTHIAZIDE 12.5 MG PO CAPS: ORAL_CAPSULE | ORAL | 0 refills | Status: DC

## 2019-09-21 MED ORDER — ALENDRONATE SODIUM 70 MG PO TABS: ORAL_TABLET | ORAL | 0 refills | Status: DC

## 2019-09-21 MED ORDER — TRESIBA FLEXTOUCH 100 UNIT/ML ~~LOC~~ SOPN: 5.0000 [IU] | PEN_INJECTOR | Freq: Every day | SUBCUTANEOUS | 6 refills | Status: DC

## 2019-09-21 MED ORDER — CEPHALEXIN 500 MG PO CAPS: 500.0000 mg | ORAL_CAPSULE | Freq: Two times a day (BID) | ORAL | 0 refills | Status: DC

## 2019-09-21 MED ORDER — GABAPENTIN 300 MG PO CAPS: ORAL_CAPSULE | ORAL | 0 refills | Status: DC

## 2019-09-21 MED ORDER — SITAGLIPTIN PHOSPHATE 100 MG PO TABS: 100.0000 mg | ORAL_TABLET | Freq: Every day | ORAL | 2 refills | Status: DC

## 2019-09-21 NOTE — Patient Instructions (Addendum)

## 2019-09-21 NOTE — Telephone Encounter (Signed)
Tyler Aas is Non-Formulary on pt's insurance  Please consider another product as it is very expensive  No alternatives were given

## 2019-09-21 NOTE — Progress Notes (Signed)
Subjective:    Patient ID: Gabriella Ferguson, female    DOB: 1929-06-23, 86 y.o.   MRN: [id number]  Chief Complaint  Patient presents with  . Medical Management of Chronic Issues    6 mth   . Urinary Frequency   PT presents to the office today for chronic follow up.  Urinary Frequency  This is a chronic problem. The current episode started more than 1 year ago. The problem occurs every urination. The problem has been waxing and waning. The pain is at a severity of 0/10. The patient is experiencing no pain. Associated symptoms include frequency. Pertinent negatives include no hematuria or nausea. She has tried antibiotics for the symptoms. The treatment provided mild relief. Her past medical history is significant for recurrent UTIs.  Diabetes She presents for her follow-up diabetic visit. She has type 2 diabetes mellitus. Her disease course has been stable. There are no hypoglycemic associated symptoms. Pertinent negatives for hypoglycemia include no headaches. Associated symptoms include foot paresthesias. Pertinent negatives for diabetes include no blurred vision. Symptoms are worsening. Diabetic complications include peripheral neuropathy. Pertinent negatives for diabetic complications include no CVA or heart disease. Risk factors for coronary artery disease include diabetes mellitus, dyslipidemia, hypertension, post-menopausal and sedentary lifestyle. She is following a generally unhealthy diet. (Does not check BS at home ) Eye exam is not current.  Hypertension This is a chronic problem. The current episode started more than 1 year ago. The problem has been waxing and waning since onset. The problem is uncontrolled. Pertinent negatives include no blurred vision, headaches, malaise/fatigue, palpitations, peripheral edema or shortness of breath. Risk factors for coronary artery disease include dyslipidemia, diabetes mellitus, obesity and sedentary lifestyle. The current treatment provides  moderate improvement. There is no history of CVA.  Hyperlipidemia This is a chronic problem. The current episode started more than 1 year ago. The problem is uncontrolled. Recent lipid tests were reviewed and are normal. Exacerbating diseases include obesity. Pertinent negatives include no shortness of breath. Current antihyperlipidemic treatment includes statins. The current treatment provides moderate improvement of lipids. Risk factors for coronary artery disease include dyslipidemia, diabetes mellitus, hypertension, a sedentary lifestyle and post-menopausal.  Constipation This is a chronic problem. The current episode started more than 1 year ago. The problem has been resolved since onset. Pertinent negatives include no nausea. She has tried diet changes for the symptoms. The treatment provided moderate relief.   Diabetic Neuropathy Pt states the pain in her feet have resolved. PT taking Gabapentin 300 mg TID.    Review of Systems  Constitutional: Negative.  Negative for malaise/fatigue.  HENT: Negative.   Eyes: Negative.  Negative for blurred vision.  Respiratory: Negative.  Negative for shortness of breath.   Cardiovascular: Negative.  Negative for palpitations.  Gastrointestinal: Positive for constipation. Negative for nausea.  Endocrine: Negative.   Genitourinary: Positive for frequency. Negative for hematuria.  Musculoskeletal: Negative.   Neurological: Negative.  Negative for headaches.  Hematological: Negative.   Psychiatric/Behavioral: Negative.   All other systems reviewed and are negative.      Objective:   Physical Exam Vitals reviewed.  Constitutional:      General: She is not in acute distress.    Appearance: She is well-developed. She is obese.  HENT:     Head: Normocephalic and atraumatic.     Right Ear: Tympanic membrane normal.     Left Ear: Tympanic membrane normal.  Eyes:     Pupils: Pupils are equal, round, and reactive to  light.  Neck:     Thyroid: No  thyromegaly.  Cardiovascular:     Rate and Rhythm: Normal rate and regular rhythm.     Heart sounds: Normal heart sounds. No murmur.  Pulmonary:     Effort: Pulmonary effort is normal. No respiratory distress.     Breath sounds: Normal breath sounds. No wheezing.  Abdominal:     General: Bowel sounds are normal. There is no distension.     Palpations: Abdomen is soft.     Tenderness: There is no abdominal tenderness.  Musculoskeletal:        General: No tenderness. Normal range of motion.     Cervical back: Normal range of motion and neck supple.  Skin:    General: Skin is warm and dry.  Neurological:     Mental Status: She is alert and oriented to person, place, and time.     Cranial Nerves: No cranial nerve deficit.     Deep Tendon Reflexes: Reflexes are normal and symmetric.  Psychiatric:        Behavior: Behavior normal.        Thought Content: Thought content normal.        Judgment: Judgment normal.     Diabetic Foot Exam - Simple   Simple Foot Form Diabetic Foot exam was performed with the following findings: Yes 09/21/2019 11:59 AM  Visual Inspection No deformities, no ulcerations, no other skin breakdown bilaterally: Yes Sensation Testing Intact to touch and monofilament testing bilaterally: Yes Pulse Check Posterior Tibialis and Dorsalis pulse intact bilaterally: Yes Comments      BP (!) 159/62   Pulse 73   Temp (!) 97.3 F (36.3 C) (Temporal)   Ht 5' (1.524 m)   Wt 179 lb 6.4 oz (81.4 kg)   SpO2 96%   BMI 35.04 kg/m      Assessment & Plan:  Gabriella Ferguson comes in today with chief complaint of Medical Management of Chronic Issues (6 mth ) and Urinary Frequency   Diagnosis and orders addressed:  1. Type 2 diabetes mellitus with hyperglycemia, without long-term current use of insulin (HCC) PT states she can not give herself insulin Will do referral to Diabetic Educator Strict low carb diet discussed in length - Bayer DCA Hb A1c Waived -  glimepiride (AMARYL) 4 MG tablet; Take 1 tablet (4 mg total) by mouth daily.  Dispense: 90 tablet; Refill: 2 - sitaGLIPtin (JANUVIA) 100 MG tablet; Take 1 tablet (100 mg total) by mouth daily.  Dispense: 90 tablet; Refill: 2 - CMP14+EGFR - CBC with Differential/Platelet - insulin degludec (TRESIBA FLEXTOUCH) 100 UNIT/ML SOPN FlexTouch Pen; Inject 0.05 mLs (5 Units total) into the skin daily.  Dispense: 1 pen; Refill: 6 - Ambulatory referral to diabetic education  2. Essential hypertension - hydrochlorothiazide (MICROZIDE) 12.5 MG capsule; TAKE (1) CAPSULE DAILY  Dispense: 90 capsule; Refill: 0 - CMP14+EGFR - CBC with Differential/Platelet  3. Hyperlipidemia associated with type 2 diabetes mellitus (HCC) - simvastatin (ZOCOR) 20 MG tablet; Take 1 tablet (20 mg total) by mouth daily.  Dispense: 90 tablet; Refill: 3 - CMP14+EGFR - CBC with Differential/Platelet - Lipid panel  4. Neuropathy - gabapentin (NEURONTIN) 300 MG capsule; TAKE  (1)  CAPSULE  TWICE DAILY.  Dispense: 180 capsule; Refill: 0 - CMP14+EGFR - CBC with Differential/Platelet  5. Osteoporosis, unspecified osteoporosis type, unspecified pathological fracture presence - alendronate (FOSAMAX) 70 MG tablet; Take with a full glass of water on an empty stomach.  Dispense: 12 tablet; Refill:  0 - CMP14+EGFR - CBC with Differential/Platelet  6. Type 2 diabetes mellitus without complication, without long-term current use of insulin (HCC)  7. Gastroesophageal reflux disease, unspecified whether esophagitis present - CMP14+EGFR - CBC with Differential/Platelet  8. Urinary frequency - Urinalysis, Complete - CMP14+EGFR - CBC with Differential/Platelet  9. Acute cystitis with hematuria If urine culture negative- Will do referral to Urologists  Force fluids RTO prn Culture pending - cephALEXin (KEFLEX) 500 MG capsule; Take 1 capsule (500 mg total) by mouth 2 (two) times daily.  Dispense: 14 capsule; Refill: 0 - Urine  Culture   Labs pending Health Maintenance reviewed Diet and exercise encouraged  Follow up plan: 1 month to recheck DM follow up with diabetic educator!    [name], FNP

## 2019-09-22 LAB — CBC WITH DIFFERENTIAL/PLATELET
Basophils Absolute: 0 10*3/uL (ref 0.0–0.2)
Basos: 0 %
EOS (ABSOLUTE): 0.2 10*3/uL (ref 0.0–0.4)
Eos: 2 %
Hematocrit: 35.8 % (ref 34.0–46.6)
Hemoglobin: 11.4 g/dL (ref 11.1–15.9)
Immature Grans (Abs): 0 10*3/uL (ref 0.0–0.1)
Immature Granulocytes: 0 %
Lymphocytes Absolute: 2.7 10*3/uL (ref 0.7–3.1)
Lymphs: 29 %
MCH: 27.7 pg (ref 26.6–33.0)
MCHC: 31.8 g/dL (ref 31.5–35.7)
MCV: 87 fL (ref 79–97)
Monocytes Absolute: 0.7 10*3/uL (ref 0.1–0.9)
Monocytes: 8 %
Neutrophils Absolute: 5.7 10*3/uL (ref 1.4–7.0)
Neutrophils: 61 %
Platelets: 230 10*3/uL (ref 150–450)
RBC: 4.12 x10E6/uL (ref 3.77–5.28)
RDW: 13.3 % (ref 11.7–15.4)
WBC: 9.4 10*3/uL (ref 3.4–10.8)

## 2019-09-22 LAB — LIPID PANEL
Chol/HDL Ratio: 2.8 ratio (ref 0.0–4.4)
Cholesterol, Total: 132 mg/dL (ref 100–199)
HDL: 48 mg/dL (ref >39)
LDL Chol Calc (NIH): 53 mg/dL (ref 0–99)
Triglycerides: 192 mg/dL — ABNORMAL HIGH (ref 0–149)
VLDL Cholesterol Cal: 31 mg/dL (ref 5–40)

## 2019-09-22 LAB — CMP14+EGFR
ALT: 12 IU/L (ref 0–32)
AST: 18 IU/L (ref 0–40)
Albumin/Globulin Ratio: 1.7 (ref 1.2–2.2)
Albumin: 4.2 g/dL (ref 3.6–4.6)
Alkaline Phosphatase: 51 IU/L (ref 39–117)
BUN/Creatinine Ratio: 12 (ref 12–28)
BUN: 14 mg/dL (ref 8–27)
Bilirubin Total: 0.3 mg/dL (ref 0.0–1.2)
CO2: 27 mmol/L (ref 20–29)
Calcium: 9.6 mg/dL (ref 8.7–10.3)
Chloride: 99 mmol/L (ref 96–106)
Creatinine, Ser: 1.13 mg/dL — ABNORMAL HIGH (ref 0.57–1.00)
GFR calc Af Amer: 50 mL/min/{1.73_m2} — ABNORMAL LOW (ref >59)
GFR calc non Af Amer: 43 mL/min/{1.73_m2} — ABNORMAL LOW (ref >59)
Globulin, Total: 2.5 g/dL (ref 1.5–4.5)
Glucose: 177 mg/dL — ABNORMAL HIGH (ref 65–99)
Potassium: 4.6 mmol/L (ref 3.5–5.2)
Sodium: 140 mmol/L (ref 134–144)
Total Protein: 6.7 g/dL (ref 6.0–8.5)

## 2019-09-22 NOTE — Telephone Encounter (Signed)
PT does not want to start insulin at this time

## 2019-09-23 LAB — URINE CULTURE

## 2019-09-24 ENCOUNTER — Other Ambulatory Visit: Payer: Self-pay | Admitting: Family

## 2019-09-24 DIAGNOSIS — E1165 Type 2 diabetes mellitus with hyperglycemia: Secondary | ICD-10-CM

## 2019-09-24 MED ORDER — AMOXICILLIN-POT CLAVULANATE 875-125 MG PO TABS: 1.0000 | ORAL_TABLET | Freq: Two times a day (BID) | ORAL | 0 refills | Status: AC

## 2019-09-24 MED ORDER — FLUCONAZOLE 150 MG PO TABS: 150.0000 mg | ORAL_TABLET | ORAL | 0 refills | Status: DC | PRN

## 2019-10-12 ENCOUNTER — Ambulatory Visit: Payer: Medicare Other | Admitting: *Deleted

## 2019-10-12 DIAGNOSIS — E1165 Type 2 diabetes mellitus with hyperglycemia: Secondary | ICD-10-CM

## 2019-10-12 NOTE — Chronic Care Management (AMB) (Addendum)
Care Management   Initial Visit Note  10/12/2019 Name: Gabriella Ferguson MRN: [id number] DOB: 1929-04-11  Referred by: Sharion Balloon, FNP Reason for referral : Chronic Care Management (RN Initial Outreach)   Gabriella Ferguson is a 86 y.o. year old female who is a primary care patient of Sharion Balloon, FNP. The CCM team was consulted for assistance with chronic disease management and care coordination needs related to diabetes and hypertension.   Review of patient status, including review of consultants reports, relevant laboratory and other test results, and collaboration with appropriate care team members and the patient's provider was performed as part of comprehensive patient evaluation and provision of chronic care management services.     SDOH (Social Determinants of Health) screening performed today: Physical Activity. See Care Plan for related entries.   Medications: Outpatient Encounter Medications as of 10/12/2019  Medication Sig   alendronate (FOSAMAX) 70 MG tablet Take with a full glass of water on an empty stomach.   aspirin EC 81 MG tablet Take 81 mg by mouth daily.   gabapentin (NEURONTIN) 300 MG capsule TAKE  (1)  CAPSULE  TWICE DAILY.   glimepiride (AMARYL) 4 MG tablet Take 1 tablet (4 mg total) by mouth daily.   hydrochlorothiazide (MICROZIDE) 12.5 MG capsule TAKE (1) CAPSULE DAILY   loratadine (CLARITIN) 10 MG tablet Take 10 mg by mouth daily as needed.   metFORMIN (GLUCOPHAGE) 1000 MG tablet TAKE (1) TABLET TWICE A DAY WITH MEALS (BREAKFAST AND SUPPER)   Multiple Vitamins-Minerals (CENTRUM ADULTS) TABS Take by mouth.   simvastatin (ZOCOR) 20 MG tablet Take 1 tablet (20 mg total) by mouth daily.   sitaGLIPtin (JANUVIA) 100 MG tablet Take 1 tablet (100 mg total) by mouth daily.   fluconazole (DIFLUCAN) 150 MG tablet Take 1 tablet (150 mg total) by mouth every three (3) days as needed. (Patient not taking: Reported on 10/12/2019)   No facility-administered  encounter medications on file as of 10/12/2019.     RN Care Plan            This Visit's Progress    Diabetes Management       Current Barriers:  Chronic Disease Management support and education needs related to diabetes Unable/unwilling to inject insulin or check blood sugar herself  Nurse Case Manager Clinical Goal(s):  Over the next 30 days, patient will work with diabetes educator to address needs related to diabetes management  Interventions:  Evaluation of current treatment plan related to diabetes and patient's adherence to plan as established by provider. Reviewed medications with patient and discussed amaryl, metformin, januvia Collaborated with [name], FNP regarding setting her up to talk with diabetes educator by telephone since she is unable/unwilling to drive to appointments Discussed plans with patient for ongoing care management follow up and provided patient with direct contact information for care management team Advised patient, providing education and rationale, to check cbg daily and record, calling [phone] for findings outside established parameters.    Patient Self Care Activities:  Performs ADL's independently Performs IADL's independently Has assistance from daughter with medication management   Initial goal documentation         Follow-up Plan:   The care management team will reach out to the patient again over the next 45 days.   Chong Sicilian, BSN, RN-BC Embedded Chronic Care Manager [facility] Direct Dial: [phone]   I have reviewed and agree with the above documentation.   [employer]  Lenna Gilford, [place]

## 2019-10-12 NOTE — Patient Instructions (Signed)
Visit Information  Goals Addressed            This Visit's Progress   . Diabetes Management       Current Barriers:  . Chronic Disease Management support and education needs related to diabetes . Unable/unwilling to inject insulin or check blood sugar herself  Nurse Case Manager Clinical Goal(s):  Marland Kitchen Over the next 30 days, patient will work with diabetes educator to address needs related to diabetes management  Interventions:  . Evaluation of current treatment plan related to diabetes and patient's adherence to plan as established by provider. . Reviewed medications with patient and discussed amaryl, metformin, januvia . Collaborated with [name], FNP regarding setting her up to talk with diabetes educator by telephone since she is unable/unwilling to drive to appointments . Discussed plans with patient for ongoing care management follow up and provided patient with direct contact information for care management team . Advised patient, providing education and rationale, to check cbg daily and record, calling [phone] for findings outside established parameters.    Patient Self Care Activities:  . Performs ADL's independently . Performs IADL's independently . Has assistance from daughter with medication management   Initial goal documentation      Chong Sicilian, BSN, RN-BC [facility] [facility] [place] [facility] Direct Dial: [phone]

## 2019-10-21 ENCOUNTER — Other Ambulatory Visit: Payer: Self-pay

## 2019-10-22 ENCOUNTER — Other Ambulatory Visit: Payer: Self-pay

## 2019-10-22 ENCOUNTER — Ambulatory Visit (INDEPENDENT_AMBULATORY_CARE_PROVIDER_SITE_OTHER): Payer: Medicare Other | Admitting: Family

## 2019-10-22 ENCOUNTER — Ambulatory Visit (INDEPENDENT_AMBULATORY_CARE_PROVIDER_SITE_OTHER): Payer: Medicare Other

## 2019-10-22 ENCOUNTER — Encounter: Payer: Self-pay | Admitting: Family

## 2019-10-22 VITALS — BP 156/56 | HR 60 | Temp 97.7°F | Ht 60.0 in | Wt 181.6 lb

## 2019-10-22 DIAGNOSIS — Z780 Asymptomatic menopausal state: Secondary | ICD-10-CM | POA: Diagnosis not present

## 2019-10-22 DIAGNOSIS — M85851 Other specified disorders of bone density and structure, right thigh: Secondary | ICD-10-CM | POA: Diagnosis not present

## 2019-10-22 DIAGNOSIS — M810 Age-related osteoporosis without current pathological fracture: Secondary | ICD-10-CM | POA: Diagnosis not present

## 2019-10-22 DIAGNOSIS — E1165 Type 2 diabetes mellitus with hyperglycemia: Secondary | ICD-10-CM

## 2019-10-22 DIAGNOSIS — M79672 Pain in left foot: Secondary | ICD-10-CM

## 2019-10-22 LAB — GLUCOSE HEMOCUE WAIVED: Glu Hemocue Waived: 200 mg/dL — ABNORMAL HIGH (ref 65–99)

## 2019-10-22 NOTE — Progress Notes (Signed)
Subjective:    Patient ID: Gabriella Ferguson, female    DOB: 03/30/1929, 86 y.o.   MRN: [id number]  Chief Complaint  Patient presents with  . Diabetes    1 mth follow up  . Bleeding/Bruising    brusing on left foot   Pt presents to the office today to recheck DM.   She also reports she hit "something" with her left pinky toe. She reports intermittent aching and soreness pain 3-4 out 10. Reports it is worse with movement.  Diabetes She presents for her follow-up diabetic visit. She has type 2 diabetes mellitus. Her disease course has been stable. There are no hypoglycemic associated symptoms. There are no diabetic associated symptoms. Pertinent negatives for diabetes include no blurred vision and no visual change. There are no hypoglycemic complications. Symptoms are stable. Pertinent negatives for diabetic complications include no CVA, heart disease, nephropathy or peripheral neuropathy. Risk factors for coronary artery disease include dyslipidemia, diabetes mellitus, hypertension and sedentary lifestyle. She is following a generally unhealthy diet. (Does not check BS at home)      Review of Systems  Eyes: Negative for blurred vision.  Musculoskeletal: Positive for arthralgias and back pain.  All other systems reviewed and are negative.      Objective:   Physical Exam Vitals reviewed.  Constitutional:      General: She is not in acute distress.    Appearance: She is well-developed.  HENT:     Head: Normocephalic and atraumatic.  Eyes:     Pupils: Pupils are equal, round, and reactive to light.  Neck:     Thyroid: No thyromegaly.  Cardiovascular:     Rate and Rhythm: Normal rate and regular rhythm.     Heart sounds: Normal heart sounds. No murmur.  Pulmonary:     Effort: Pulmonary effort is normal. No respiratory distress.     Breath sounds: Normal breath sounds. No wheezing.  Abdominal:     General: Bowel sounds are normal. There is no distension.     Palpations:  Abdomen is soft.     Tenderness: There is no abdominal tenderness.  Musculoskeletal:        General: No tenderness. Normal range of motion.     Cervical back: Normal range of motion and neck supple.     Right lower leg: Edema (trace) present.     Left lower leg: Edema (trace) present.       Feet:  Feet:     Comments: Bruising on left 5 metatarsal with mild swelling Skin:    General: Skin is warm and dry.  Neurological:     Mental Status: She is alert and oriented to person, place, and time.     Cranial Nerves: No cranial nerve deficit.     Deep Tendon Reflexes: Reflexes are normal and symmetric.  Psychiatric:        Behavior: Behavior normal.        Thought Content: Thought content normal.        Judgment: Judgment normal.      BP (!) 156/56   Pulse 60   Temp 97.7 F (36.5 C) (Temporal)   Ht 5' (1.524 m)   Wt 181 lb 9.6 oz (82.4 kg)   SpO2 98%   BMI 35.47 kg/m       Assessment & Plan:  Gabriella Ferguson comes in today with chief complaint of Diabetes (1 mth follow up) and Bleeding/Bruising (brusing on left foot)   Diagnosis and orders addressed:  1. Type 2 diabetes mellitus with hyperglycemia, without long-term current use of insulin (HCC) Continue medications  Low carb diet  - CMP14+EGFR - Glucose Hemocue Waived  2. Left foot pain Avoid re-injury More than like fractured, can buddy tape  - CMP14+EGFR   Labs pending Health Maintenance reviewed Diet and exercise encouraged  Follow up plan: 1 month to recheck DM    [name], FNP

## 2019-10-22 NOTE — Patient Instructions (Signed)

## 2019-10-23 LAB — CMP14+EGFR
ALT: 11 IU/L (ref 0–32)
AST: 15 IU/L (ref 0–40)
Albumin/Globulin Ratio: 1.8 (ref 1.2–2.2)
Albumin: 4.1 g/dL (ref 3.6–4.6)
Alkaline Phosphatase: 52 IU/L (ref 39–117)
BUN/Creatinine Ratio: 12 (ref 12–28)
BUN: 14 mg/dL (ref 8–27)
Bilirubin Total: 0.3 mg/dL (ref 0.0–1.2)
CO2: 24 mmol/L (ref 20–29)
Calcium: 9.2 mg/dL (ref 8.7–10.3)
Chloride: 104 mmol/L (ref 96–106)
Creatinine, Ser: 1.19 mg/dL — ABNORMAL HIGH (ref 0.57–1.00)
GFR calc Af Amer: 47 mL/min/{1.73_m2} — ABNORMAL LOW (ref >59)
GFR calc non Af Amer: 41 mL/min/{1.73_m2} — ABNORMAL LOW (ref >59)
Globulin, Total: 2.3 g/dL (ref 1.5–4.5)
Glucose: 196 mg/dL — ABNORMAL HIGH (ref 65–99)
Potassium: 4.8 mmol/L (ref 3.5–5.2)
Sodium: 144 mmol/L (ref 134–144)
Total Protein: 6.4 g/dL (ref 6.0–8.5)

## 2019-11-19 ENCOUNTER — Telehealth: Payer: Medicare Other

## 2019-11-23 ENCOUNTER — Ambulatory Visit: Payer: Medicare Other | Admitting: Family

## 2019-11-26 ENCOUNTER — Ambulatory Visit: Payer: Medicare Other | Admitting: *Deleted

## 2019-11-26 NOTE — Progress Notes (Addendum)
  Chronic Care Management   Outreach Note  11/26/2019 Name: Gabriella Ferguson MRN: [id number] DOB: Mar 06, 1929  Referred by: Sharion Balloon, FNP Reason for referral : Chronic Care Management (RN Follow-up)   An unsuccessful telephone follow-up was attempted today. The patient was referred to the case management team for assistance with care management and care coordination.   Follow Up Plan: The care management team will reach out to the patient again over the next 30 days.   SIGNATURE      I have reviewed and agree with the above documentation.   [name], FNP

## 2019-12-04 ENCOUNTER — Ambulatory Visit (INDEPENDENT_AMBULATORY_CARE_PROVIDER_SITE_OTHER): Payer: Medicare Other | Admitting: Family

## 2019-12-04 ENCOUNTER — Encounter: Payer: Self-pay | Admitting: Family

## 2019-12-04 ENCOUNTER — Other Ambulatory Visit: Payer: Self-pay

## 2019-12-04 VITALS — BP 134/60 | HR 82 | Temp 97.3°F | Ht 60.0 in | Wt 176.2 lb

## 2019-12-04 DIAGNOSIS — E1165 Type 2 diabetes mellitus with hyperglycemia: Secondary | ICD-10-CM

## 2019-12-04 DIAGNOSIS — R350 Frequency of micturition: Secondary | ICD-10-CM | POA: Diagnosis not present

## 2019-12-04 LAB — BAYER DCA HB A1C WAIVED: HB A1C (BAYER DCA - WAIVED): 10.3 % — ABNORMAL HIGH (ref <7.0)

## 2019-12-04 NOTE — Patient Instructions (Signed)

## 2019-12-04 NOTE — Progress Notes (Signed)
Subjective:    Patient ID: Gabriella Ferguson, female    DOB: Apr 25, 1929, 86 y.o.   MRN: [id number]  Chief Complaint  Patient presents with  . Diabetes    1 mth    Pt presents to the office today to recheck DM. Her A1C is 10.3% today. She reports she has been taking her metformin 1000 mg BID, Januvia 100 mg, and Glimepiride 4 mg daily. She does report her diet  Is not the best and eats a lot of froze meals.   She is complaining of increased urinary frequency.  Diabetes She presents for her follow-up diabetic visit. She has type 2 diabetes mellitus. Her disease course has been stable. There are no hypoglycemic associated symptoms. Pertinent negatives for diabetes include no blurred vision, no foot paresthesias and no visual change. Symptoms are stable. Pertinent negatives for diabetic complications include no CVA, heart disease or nephropathy. Risk factors for coronary artery disease include dyslipidemia, diabetes mellitus, hypertension and sedentary lifestyle. (She does not check her BS at home)  Urinary Frequency  This is a recurrent problem. The current episode started 1 to 4 weeks ago. The problem occurs every urination. The problem has been waxing and waning. The pain is at a severity of 2/10. The pain is mild. Associated symptoms include frequency and urgency. Pertinent negatives include no discharge, flank pain, hematuria, nausea or vomiting. She has tried increased fluids for the symptoms. The treatment provided no relief.      Review of Systems  Eyes: Negative for blurred vision.  Gastrointestinal: Negative for nausea and vomiting.  Genitourinary: Positive for frequency and urgency. Negative for flank pain and hematuria.  All other systems reviewed and are negative.      Objective:   Physical Exam Vitals reviewed.  Constitutional:      General: She is not in acute distress.    Appearance: She is well-developed.  HENT:     Head: Normocephalic and atraumatic.     Right Ear:  Tympanic membrane normal.     Left Ear: Tympanic membrane normal.  Eyes:     Pupils: Pupils are equal, round, and reactive to light.  Neck:     Thyroid: No thyromegaly.  Cardiovascular:     Rate and Rhythm: Normal rate and regular rhythm.     Heart sounds: Normal heart sounds. No murmur.  Pulmonary:     Effort: Pulmonary effort is normal. No respiratory distress.     Breath sounds: Normal breath sounds. No wheezing.  Abdominal:     General: Bowel sounds are normal. There is no distension.     Palpations: Abdomen is soft.     Tenderness: There is no abdominal tenderness.  Musculoskeletal:        General: No tenderness. Normal range of motion.     Cervical back: Normal range of motion and neck supple.  Skin:    General: Skin is warm and dry.  Neurological:     Mental Status: She is alert and oriented to person, place, and time.     Cranial Nerves: No cranial nerve deficit.     Deep Tendon Reflexes: Reflexes are normal and symmetric.  Psychiatric:        Behavior: Behavior normal.        Thought Content: Thought content normal.        Judgment: Judgment normal.       BP 134/60   Pulse 82   Temp (!) 97.3 F (36.3 C) (Temporal)   Ht 5' (  1.524 m)   Wt 176 lb 3.2 oz (79.9 kg)   SpO2 95%   BMI 34.41 kg/m      Assessment & Plan:  Gabriella Ferguson comes in today with chief complaint of Diabetes (1 mth )   Diagnosis and orders addressed:  1. Type 2 diabetes mellitus with hyperglycemia, without long-term current use of insulin (HCC) Pt will follow up with Pharmacists Strict low carb diet   Continue medications  - Bayer DCA Hb A1c Waived  2. Urinary frequency - PT attempted to leave sample, will continue to try  Force fluids - Urinalysis, Complete    [name], FNP

## 2019-12-08 ENCOUNTER — Other Ambulatory Visit: Payer: Self-pay

## 2019-12-08 ENCOUNTER — Ambulatory Visit (INDEPENDENT_AMBULATORY_CARE_PROVIDER_SITE_OTHER): Payer: Medicare Other | Admitting: Pharmacist

## 2019-12-08 DIAGNOSIS — E1165 Type 2 diabetes mellitus with hyperglycemia: Secondary | ICD-10-CM | POA: Diagnosis not present

## 2019-12-08 NOTE — Progress Notes (Addendum)
      S:    22 yoF referred to pharmacy clinic for medication management of uncontrolled diabetes . Diabetes: T2DM; most recent A1c 10.3% on 12/04/19 . Most recent eGFR: 41 . Current antihyperglycemic regimen: metformin, glimepiride, januvia . Denies hypoglycemic symptoms . Reports hyperglycemic symptoms, including polyuria, nocturia . Current meal patterns: discussed meals, patient enjoys sweets, etc o Encouraged adequate water intake o Limiting sweets . Current exercise: n/a, patient ambulates with cane, previous hip fracture . Current blood glucose readings: does not check . Cardiovascular risk reduction: o Current hypertensive regimen: hctz o Current hyperlipidemia regimen:  Simvastatin 36m-LDL 53 on 08/2019 o Current antiplatelet regimen: O:  Lab Results  Component Value Date   HGBA1C 10.3 (H) 12/04/2019   Lipid Panel     Component Value Date/Time   CHOL 132 09/21/2019 1214   TRIG 192 (H) 09/21/2019 1214   HDL 48 09/21/2019 1214   CHOLHDL 2.8 09/21/2019 1214   LDLCALC 53 09/21/2019 1214    A/P:  Diabetes longstanding currently UNCONTROLLED. Patient is able to verbalize appropriate hypoglycemia management plan. Patient is adherent with medication. Control is suboptimal due to DIETARY/LIFESTYLE.  Daughter fills pill box every Saturday.  -Started GLP-1 PO Rybelsus (generic name: semaglutide) 324min the AM 30 minutes prior to meals/other medications.  Titrate to 78m9maily as tolerated  -Rybelsus 3mg68mily sample given #30 days LOT#[id number], EXP 05/2021  -Discontinue glimepiride (called pharmacy to d/c)   -Continue metformin  -Decrease januvia to 50mg20mly based on renal function  -Extensively discussed pathophysiology of diabetes, recommended lifestyle interventions, dietary effects on blood sugar control  -Counseled on s/sx of and management of hypoglycemia  -Next A1C anticipated 01/2020  Written patient instructions provided.  Total time in face to face  counseling 32 minutes.   Follow up Pharmacist Clinic Visit in 1-2 MONTHS.   Plan discussed with PCP    JulieRegina EckrmD, BCPS Clinical Pharmacist, [phone]

## 2019-12-11 ENCOUNTER — Other Ambulatory Visit: Payer: Self-pay | Admitting: Family

## 2019-12-11 DIAGNOSIS — E119 Type 2 diabetes mellitus without complications: Secondary | ICD-10-CM

## 2019-12-14 ENCOUNTER — Telehealth: Payer: Self-pay | Admitting: Pharmacist

## 2019-12-14 MED ORDER — RYBELSUS 7 MG PO TABS: 7.0000 mg | ORAL_TABLET | Freq: Every day | ORAL | 1 refills | Status: DC

## 2019-12-14 NOTE — Addendum Note (Signed)
Addended by: [name] A on: 12/14/2019 04:50 PM   Modules accepted: Orders

## 2019-12-14 NOTE — Telephone Encounter (Signed)
Diabetes:   Most recent A1c 10.3% on 12/04/19-->Patient has started Rybelsus (day 3).  D/c'd glimepiride.  Will decrease Januvia to 50mg  daily per renal function.  Patient tolerating Rybelsus well, denies side effects. Denies s/sx of hypoglycemia.  Will titrate Rybelsus to 7mg  daily after 30 days.   Unable to check FBG, will check next week in the office.  Appt made for 4/27 @10 :5.     Regina Eck, PharmD, BCPS Clinical Pharmacist, [place]  II Phone [phone]

## 2019-12-22 ENCOUNTER — Ambulatory Visit: Payer: Medicare Other | Admitting: Pharmacist

## 2019-12-22 ENCOUNTER — Ambulatory Visit (INDEPENDENT_AMBULATORY_CARE_PROVIDER_SITE_OTHER): Payer: Medicare Other | Admitting: Family Medicine

## 2019-12-22 ENCOUNTER — Other Ambulatory Visit: Payer: Self-pay

## 2019-12-22 VITALS — BP 145/65 | HR 59 | Temp 98.7°F

## 2019-12-22 DIAGNOSIS — N3000 Acute cystitis without hematuria: Secondary | ICD-10-CM

## 2019-12-22 DIAGNOSIS — E1165 Type 2 diabetes mellitus with hyperglycemia: Secondary | ICD-10-CM

## 2019-12-22 DIAGNOSIS — R300 Dysuria: Secondary | ICD-10-CM | POA: Diagnosis not present

## 2019-12-22 LAB — URINALYSIS, COMPLETE
Bilirubin, UA: NEGATIVE
Ketones, UA: NEGATIVE
Leukocytes,UA: NEGATIVE
Nitrite, UA: NEGATIVE
Specific Gravity, UA: 1.015 (ref 1.005–1.030)
Urobilinogen, Ur: 0.2 mg/dL (ref 0.2–1.0)
pH, UA: 5 (ref 5.0–7.5)

## 2019-12-22 LAB — MICROSCOPIC EXAMINATION

## 2019-12-22 MED ORDER — CEPHALEXIN 500 MG PO CAPS: 500.0000 mg | ORAL_CAPSULE | Freq: Two times a day (BID) | ORAL | 0 refills | Status: AC

## 2019-12-22 NOTE — Progress Notes (Signed)
   Pharmacy Clinic Diabetes  12/21/2019 Name: Gabriella Ferguson MRN: [id number] DOB: 1929/02/03  Referred by: Sharion Balloon, FNP Reason for referral : Diabetes  S:  69 yoF presents for diabetes evaluation, education, and management Patient was referred and last seen by Primary Care Provider on 11/2019.   Insurance coverage/medication affordability: BCBS medicare   Patient reports adherence with medications.  Daughter fills weekly pill box   Diabetes: T2DM; most recent A1c 10.3% on 12/04/19  Most recent eGFR: 41  Current antihyperglycemic regimen: metformin, januvia, rybelsus  Denies hypoglycemic symptoms  Reports hyperglycemic symptoms, including polyuria, nocturia  Current meal patterns: discussed meals, patient enjoys sweets, etc ? Encouraged adequate water intake ? Limiting sweets  Current exercise: n/a, patient ambulates with cane, previous hip fracture  Current blood glucose readings: does not check  Cardiovascular risk reduction: ? Current hypertensive regimen: hctz ? Current hyperlipidemia regimen:  Simvastatin '20mg'$ -LDL 53 on 08/2019 ? Current antiplatelet regimen: n/a   O:  Lab Results  Component Value Date   HGBA1C 10.3 (H) 12/04/2019    Vitals:   12/28/19 2230  BP: (!) 145/65  Pulse: (!) 59  Temp: 98.7 F (37.1 C)   Lipid Panel     Component Value Date/Time   CHOL 132 09/21/2019 1214   TRIG 192 (H) 09/21/2019 1214   HDL 48 09/21/2019 1214   CHOLHDL 2.8 09/21/2019 1214   LDLCALC 53 09/21/2019 1214   Home fasting blood sugars: not checking, BG was 243 fasting this AM  A/P:  Diabetes longstanding T2DM currently uncontrolled. Patient is able to verbalize appropriate hypoglycemia management plan. Patient is adherent with medication. Control is suboptimal due to dietary/lifestyle.  -Decrease Januvia to '50mg'$  daily per renal function (GFR ~41)--patient has already picked up 90 days of medication, will call daughter to cut in half for pill box until  new RXs can be filled  -Decrease metformin to '500mg'$  BID per renal function----patient has already picked up 90 days of medication, will call daughter to cut in half for pill box until new RXs can be filled ([name] [phone] weekly pill box on saturdays)  -Continued GLP-1 Rybelsus (generic name:semaglutide) '3mg'$  daily.  Will increase to '7mg'$  daily after 30-day sample is completed (RX called in to [facility])  -Extensively discussed pathophysiology of diabetes, recommended lifestyle interventions, dietary effects on blood sugar control  -Counseled on s/sx of and management of hypoglycemia  -Next A1C anticipated 02/04/20  -Patient complaining of UTI symptoms, afebrile, urine sample given and telephone visit set up for patient this afternoon.  Written patient instructions provided.  Total time in face to face counseling 21 minutes.   Follow up Pharmacist Clinic Visit in 2 weeks by telephone    Regina Eck, PharmD, BCPS Clinical Pharmacist, [place]  II Phone [phone]

## 2019-12-22 NOTE — Progress Notes (Signed)
Telephone visit  Subjective: CC: UTI PCP: Sharion Balloon, FNP [phone] Gabriella Ferguson is a 86 y.o. female calls for telephone consult today. Patient provides verbal consent for consult held via phone.  Due to COVID-19 pandemic this visit was conducted virtually. This visit type was conducted due to national recommendations for restrictions regarding the COVID-19 Pandemic (e.g. social distancing, sheltering in place) in an effort to limit this patient's exposure and mitigate transmission in our community. All issues noted in this document were discussed and addressed.  A physical exam was not performed with this format.   Location of patient: home Location of provider: [facility] Others present for call: none  1. Urinary symptoms Patient reports a 1 month h/o dysuria, urinary odor, urinary frequency, urgency.  No hematuria, fevers, chills, abdominal pain, nausea, vomiting, back pain, vaginal discharge.  Patient has used nothing for symptoms.    ROS: Per HPI  No Known Allergies Past Medical History:  Diagnosis Date  . Breast cancer ([place])   . Diabetes mellitus without complication (HCC)    Type 2  . Hip fracture ([place])   . Meningitis     Current Outpatient Medications:  .  alendronate (FOSAMAX) 70 MG tablet, Take with a full glass of water on an empty stomach., Disp: 12 tablet, Rfl: 0 .  aspirin EC 81 MG tablet, Take 81 mg by mouth daily., Disp: , Rfl:  .  gabapentin (NEURONTIN) 300 MG capsule, TAKE  (1)  CAPSULE  TWICE DAILY., Disp: 180 capsule, Rfl: 0 .  hydrochlorothiazide (MICROZIDE) 12.5 MG capsule, TAKE (1) CAPSULE DAILY, Disp: 90 capsule, Rfl: 0 .  loratadine (CLARITIN) 10 MG tablet, Take 10 mg by mouth daily as needed., Disp: , Rfl:  .  metFORMIN (GLUCOPHAGE) 1000 MG tablet, TAKE (1) TABLET TWICE A DAY WITH MEALS (BREAKFAST AND SUPPER), Disp: 180 tablet, Rfl: 0 .  Multiple Vitamins-Minerals (CENTRUM ADULTS) TABS, Take by mouth., Disp: , Rfl:  .  Semaglutide (RYBELSUS) 7 MG TABS,  Take 7 mg by mouth daily., Disp: 90 tablet, Rfl: 1 .  simvastatin (ZOCOR) 20 MG tablet, Take 1 tablet (20 mg total) by mouth daily., Disp: 90 tablet, Rfl: 3 .  sitaGLIPtin (JANUVIA) 50 MG tablet, Take 50 mg by mouth daily., Disp: , Rfl:   Assessment/ Plan: 86 y.o. female   1. Acute cystitis without hematuria UA with 6-10 WBC, and few bacteria.  Given symptoms, will start empiric treatment and wait for UCx.  Home care instructions reviewed.  Follow up if no improvement or worsening. - Urinalysis, Complete - Urine Culture - cephALEXin (KEFLEX) 500 MG capsule; Take 1 capsule (500 mg total) by mouth 2 (two) times daily for 7 days.  Dispense: 14 capsule; Refill: 0   Start time: 12:36pm (called no answer); 12:54pm (no answer); 2:20pm (no answer): 2:45pm End time: 2:50pm  Total time spent on patient care (including telephone call/ virtual visit): 10 minutes  [place], [place] [phone]

## 2019-12-24 LAB — URINE CULTURE

## 2019-12-28 ENCOUNTER — Encounter: Payer: Self-pay | Admitting: Pharmacist

## 2020-01-05 ENCOUNTER — Other Ambulatory Visit: Payer: Self-pay | Admitting: Family

## 2020-01-05 ENCOUNTER — Telehealth: Payer: Self-pay | Admitting: Pharmacist

## 2020-01-05 ENCOUNTER — Ambulatory Visit (INDEPENDENT_AMBULATORY_CARE_PROVIDER_SITE_OTHER): Payer: Medicare Other | Admitting: Pharmacist

## 2020-01-05 ENCOUNTER — Other Ambulatory Visit: Payer: Self-pay

## 2020-01-05 DIAGNOSIS — E1165 Type 2 diabetes mellitus with hyperglycemia: Secondary | ICD-10-CM | POA: Diagnosis not present

## 2020-01-05 DIAGNOSIS — N3000 Acute cystitis without hematuria: Secondary | ICD-10-CM

## 2020-01-05 LAB — MICROSCOPIC EXAMINATION: Renal Epithel, UA: NONE SEEN /hpf

## 2020-01-05 LAB — URINALYSIS, COMPLETE
Bilirubin, UA: NEGATIVE
Ketones, UA: NEGATIVE
Nitrite, UA: NEGATIVE
RBC, UA: NEGATIVE
Specific Gravity, UA: 1.02 (ref 1.005–1.030)
Urobilinogen, Ur: 0.2 mg/dL (ref 0.2–1.0)
pH, UA: 5.5 (ref 5.0–7.5)

## 2020-01-05 MED ORDER — AMOXICILLIN-POT CLAVULANATE 875-125 MG PO TABS
1.0000 | ORAL_TABLET | Freq: Two times a day (BID) | ORAL | 0 refills | Status: DC
Start: 2020-01-05 — End: 2020-02-04

## 2020-01-05 MED ORDER — GLIPIZIDE ER 2.5 MG PO TB24: 2.5000 mg | ORAL_TABLET | Freq: Every day | ORAL | 3 refills | Status: DC

## 2020-01-05 MED ORDER — FLUCONAZOLE 150 MG PO TABS: ORAL_TABLET | ORAL | 0 refills | Status: DC

## 2020-01-05 NOTE — Progress Notes (Signed)
Augmentin Prescription sent to pharmacy, urine culture pending

## 2020-01-05 NOTE — Progress Notes (Signed)
   Pharmacy Clinic Diabetes  01/05/2020 Name: Gabriella Ferguson  MRN: [id number]       DOB: 31-Dec-1928  Referred by: Sharion Balloon, FNP Reason for referral : Diabetes  S:  5 yoF presents for diabetes evaluation, education, and management Patient was referred and last seen by Primary Care Provider on 11/2019.   Insurance coverage/medication affordability: BCBS medicare   Patient reports adherence with medications.  Daughter fills weekly pill box   Diabetes:T2DM;most recent A1c10.3%on 12/04/19  Most recent eGFR:41  Current antihyperglycemic regimen:metformin, januvia, rybelsus  Denies hypoglycemic symptoms  Reports hyperglycemic symptoms, including polyuria, nocturia  Current meal patterns:discussed meals, patient enjoys sweets, etc ? Encouraged adequate water intake ? Limiting sweets  Current exercise:n/a, patient ambulates with cane, previous hip fracture  Current blood glucose readings:does not check  Cardiovascular risk reduction: ? Current hypertensive regimen:hctz ? Current hyperlipidemia regimen:Simvastatin '20mg'$ -LDL 53 on 08/2019 ? Current antiplatelet regimen: n/a   O:  Recent Labs       Lab Results  Component Value Date   HGBA1C 10.3 (H) 12/04/2019         Vitals:   12/28/19 2230  BP: (!) 145/65  Pulse: (!) 59  Temp: 98.7 F (37.1 C)   Lipid Panel  Labs (Brief)          Component Value Date/Time   CHOL 132 09/21/2019 1214   TRIG 192 (H) 09/21/2019 1214   HDL 48 09/21/2019 1214   CHOLHDL 2.8 09/21/2019 1214   LDLCALC 53 09/21/2019 1214     Home fasting blood sugars: not checking, BG was 259 fasting this AM  A/P:  Diabetes longstanding T2DM currently uncontrolled. Patient is able to verbalize appropriate hypoglycemia management plan. Patient is adherent with medication. Control is suboptimal due to dietary/lifestyle.  -Continue Januvia and metformin as prescribed  -Discontinued GLP-1 Rybelsus (generic  name:semaglutide) '3mg'$  daily.  Patient states she has broke out with red spots.  Spots did not appear like an allergic reactions and patient only had a few places on her skin.  It appeared more liek irritation from her facial.  Nonetheless, will discontinue per patient request.  Allergy/intolerance listed in the EMR  -Patient refusing injectables.  Extensively tried to explain options and rationale.  Consider glipizide 2.'5mg'$  XL to aid in glycemic control. This medication is not ideal, however PO DM medicaition options are few given intoleranes, h/o yeast infections, doesn't prefer injections.  -Extensively discussed pathophysiology of diabetes, recommended lifestyle interventions, dietary effects on blood sugar control  -Counseled on s/sx of and management of hypoglycemia  -Next A1C anticipated 02/04/20  -Patient still complaining of UTI/urinary burning.  Discussed with PCP, UCx/UA repeated.  Yeast and WBCs present. Fluconazole called in for patient.  She is afebrile and completed her course of Abx for previous UTI.  Patient notified of RX for fluconazole called in at 4:21pm on 01/05/20  Written patient instructions provided.  Total time in face to face counseling 21 minutes.   Follow up Pharmacist as neededClinic Visit in 2 weeks by telephone   Regina Eck, PharmD, BCPS Clinical Pharmacist, [place]  II Phone [phone]

## 2020-01-05 NOTE — Progress Notes (Signed)
Patient aware and verbalized understanding. °

## 2020-01-05 NOTE — Telephone Encounter (Signed)
Opened in error

## 2020-01-06 LAB — URINE CULTURE

## 2020-01-22 ENCOUNTER — Ambulatory Visit (INDEPENDENT_AMBULATORY_CARE_PROVIDER_SITE_OTHER): Payer: Medicare Other

## 2020-01-22 DIAGNOSIS — Z0000 Encounter for general adult medical examination without abnormal findings: Secondary | ICD-10-CM | POA: Diagnosis not present

## 2020-01-22 NOTE — Progress Notes (Addendum)
MEDICARE ANNUAL WELLNESS VISIT  01/22/2020  Telephone Visit Disclaimer This Medicare AWV was conducted by telephone due to national recommendations for restrictions regarding the COVID-19 Pandemic (e.g. social distancing).  I verified, using two identifiers, that I am speaking with Gabriella Ferguson or their authorized healthcare agent. I discussed the limitations, risks, security, and privacy concerns of performing an evaluation and management service by telephone and the potential availability of an in-person appointment in the future. The patient expressed understanding and agreed to proceed.   Subjective:  Gabriella Ferguson is a 86 y.o. female patient of Hawks, Theador Hawthorne, FNP who had a Medicare Annual Wellness Visit today via telephone. Gabriella Ferguson is Retired and lives alone. She has two children. She reports that she is socially active and does interact with friends/family regularly. She is minimally physically active and enjoys working puzzles and spending time with family.  Patient Care Team: Sharion Balloon, FNP as PCP - General (Family Medicine)  Advanced Directives 01/22/2020 01/21/2019  Does Patient Have a Medical Advance Directive? No No  Would patient like information on creating a medical advance directive? No - Patient declined No - Patient declined    Hospital Utilization Over the Past 12 Months: # of hospitalizations or ER visits: 0 # of surgeries: 0  Review of Systems    Patient reports that her overall health is unchanged compared to last year.  History obtained from chart review and the patient  Patient Reported Readings (BP, Pulse, CBG, Weight, etc) none  Pain Assessment Pain : No/denies pain     Current Medications & Allergies (verified) Allergies as of 01/22/2020       Reactions   Semaglutide    Red patches on skin s/p Rybelsus         Medication List        Accurate as of Jan 22, 2020  9:34 AM. If you have any questions, ask your nurse or  doctor.          alendronate 70 MG tablet Commonly known as: FOSAMAX Take with a full glass of water on an empty stomach.   amoxicillin-clavulanate 875-125 MG tablet Commonly known as: AUGMENTIN Take 1 tablet by mouth 2 (two) times daily.   aspirin EC 81 MG tablet Take 81 mg by mouth daily.   Centrum Adults Tabs Take by mouth.   fluconazole 150 MG tablet Commonly known as: DIFLUCAN Take 1 tablet (150 mg total) by mouth every three (3) days as needed.   gabapentin 300 MG capsule Commonly known as: NEURONTIN TAKE  (1)  CAPSULE  TWICE DAILY.   glipiZIDE 2.5 MG 24 hr tablet Commonly known as: GLUCOTROL XL Take 1 tablet (2.5 mg total) by mouth daily with breakfast.   hydrochlorothiazide 12.5 MG capsule Commonly known as: MICROZIDE TAKE (1) CAPSULE DAILY   loratadine 10 MG tablet Commonly known as: CLARITIN Take 10 mg by mouth daily as needed.   metFORMIN 1000 MG tablet Commonly known as: GLUCOPHAGE Take 1,000 mg by mouth 2 (two) times daily with a meal.   simvastatin 20 MG tablet Commonly known as: ZOCOR Take 1 tablet (20 mg total) by mouth daily.   sitaGLIPtin 100 MG tablet Commonly known as: JANUVIA Take 100 mg by mouth daily.        History (reviewed): Past Medical History:  Diagnosis Date   Breast cancer ([place])    Diabetes mellitus without complication ([place])    Type 2   Hip fracture ([place])  Meningitis    Past Surgical History:  Procedure Laterality Date   HIP CLOSED REDUCTION     Family History  Problem Relation Age of Onset   Diabetes Daughter    Stroke Mother    Stroke Father    Social History   Socioeconomic History   Marital status: Widowed    Spouse name: Not on file   Number of children: 2   Years of education: Not on file   Highest education level: 10th grade  Occupational History   Occupation: Retired  Tobacco Use   Smoking status: Never Smoker   Smokeless tobacco: Never Used  Substance and Sexual Activity   Alcohol use:  No   Drug use: No   Sexual activity: Not Currently  Other Topics Concern   Not on file  Social History Narrative   Not on file   Social Determinants of Health   Financial Resource Strain:    Difficulty of Paying Living Expenses:   Food Insecurity:    Worried About Charity fundraiser in the Last Year:    Arboriculturist in the Last Year:   Transportation Needs:    Film/video editor (Medical):    Lack of Transportation (Non-Medical):   Physical Activity:    Days of Exercise per Week:    Minutes of Exercise per Session:   Stress:    Feeling of Stress :   Social Connections:    Frequency of Communication with Friends and Family:    Frequency of Social Gatherings with Friends and Family:    Attends Religious Services:    Active Member of Clubs or Organizations:    Attends Archivist Meetings:    Marital Status:     Activities of Daily Living In your present state of health, do you have any difficulty performing the following activities: 01/22/2020 10/12/2019  Hearing? N N  Vision? N N  Difficulty concentrating or making decisions? N N  Walking or climbing stairs? Y Y  Comment balance is off so patient walks with a cane -  Dressing or bathing? N N  Doing errands, shopping? N N  Preparing Food and eating ? N N  Using the Toilet? N N  In the past six months, have you accidently leaked urine? N N  Do you have problems with loss of bowel control? N N  Managing your Medications? Gabriella Ferguson  Comment daughter fills pill box weekly daughter puts medications in weekly pill box. unable/unwilling to inject insulin or check her blood sugar  Managing your Finances? N N  Housekeeping or managing your Housekeeping? N N  Some recent data might be hidden    Patient Education/ Literacy How often do you need to have someone help you when you read instructions, pamphlets, or other written materials from your doctor or pharmacy?: 1 - Never What is the last grade level you completed  in school?: 10th grade  Exercise Current Exercise Habits: The patient does not participate in regular exercise at present, Exercise limited by: orthopedic condition(s)  Diet Patient reports consuming 3 meals a day and 1 snack(s) a day Patient reports that her primary diet is: Regular Patient reports that she does have regular access to food.   Depression Screen PHQ 2/9 Scores 01/22/2020 12/04/2019 10/22/2019 09/21/2019 06/12/2019 03/20/2019 09/19/2018  PHQ - 2 Score 0 0 0 0 0 0 0     Fall Risk Fall Risk  01/22/2020 12/04/2019 10/22/2019 09/21/2019 06/12/2019  Falls in the past year?  0 0 0 0 0  Number falls in past yr: - - - - -  Injury with Fall? - - - - -  Comment - - - - -  Risk Factor Category  - - - - -  Risk for fall due to : - - - - -  Follow up Falls evaluation completed - - - -     Objective:  Gabriella Ferguson seemed alert and oriented and she participated appropriately during our telephone visit.  Blood Pressure Weight BMI  BP Readings from Last 3 Encounters:  12/28/19 (!) 145/65  12/04/19 134/60  10/22/19 (!) 156/56   Wt Readings from Last 3 Encounters:  12/04/19 176 lb 3.2 oz (79.9 kg)  10/22/19 181 lb 9.6 oz (82.4 kg)  09/21/19 179 lb 6.4 oz (81.4 kg)   BMI Readings from Last 1 Encounters:  12/04/19 34.41 kg/m    *Unable to obtain current vital signs, weight, and BMI due to telephone visit type  Hearing/Vision  Gabriella Ferguson did not seem to have difficulty with hearing/understanding during the telephone conversation Reports that she has not had a formal eye exam by an eye care professional within the past year Reports that she has not had a formal hearing evaluation within the past year *Unable to fully assess hearing and vision during telephone visit type  Cognitive Function: 6CIT Screen 01/22/2020 01/21/2019  What Year? 0 points 0 points  What month? 0 points 0 points  What time? 0 points 0 points  Count back from 20 0 points 0 points  Months in reverse 0 points 0  points  Repeat phrase 4 points 0 points  Total Score 4 0   (Normal:0-7, Significant for Dysfunction: >8)  Normal Cognitive Function Screening: Yes   Immunization & Health Maintenance Record Immunization History  Administered Date(s) Administered   Moderna SARS-COVID-2 Vaccination 10/26/2019, 11/23/2019   Pneumococcal Conjugate-13 12/31/2016   Pneumococcal Polysaccharide-23 04/09/2018   Tdap 12/31/2016    Health Maintenance  Topic Date Due   OPHTHALMOLOGY EXAM  10/08/2019   URINE MICROALBUMIN  03/19/2020   INFLUENZA VACCINE  03/27/2020   HEMOGLOBIN A1C  06/04/2020   FOOT EXAM  09/20/2020   TETANUS/TDAP  01/01/2027   COVID-19 Vaccine  Completed   PNA vac Low Risk Adult  Completed   DEXA SCAN  Discontinued       Assessment  This is a routine wellness examination for Gabriella Ferguson.  Health Maintenance: Due or Overdue Health Maintenance Due  Topic Date Due   OPHTHALMOLOGY EXAM  10/08/2019   URINE MICROALBUMIN  03/19/2020    Gabriella Ferguson does not need a referral for Community Assistance: Care Management:   no Social Work:    no Prescription Assistance:  no Nutrition/Diabetes Education:  no   Plan:  Personalized Goals Goals Addressed             This Visit's Progress    Patient Stated       01/22/2020 AWV Goal: Exercise for General Health  Patient will verbalize understanding of the benefits of increased physical activity: Exercising regularly is important. It will improve your overall fitness, flexibility, and endurance. Regular exercise also will improve your overall health. It can help you control your weight, reduce stress, and improve your bone density. Over the next year, patient will increase physical activity as tolerated with a goal of at least 150 minutes of moderate physical activity per week.  You can tell that you are exercising at a moderate intensity  if your heart starts beating faster and you start breathing faster but can still hold  a conversation. Moderate-intensity exercise ideas include: Walking 1 mile (1.6 km) in about 15 minutes Biking Hiking Golfing Dancing Water aerobics Patient will verbalize understanding of everyday activities that increase physical activity by providing examples like the following: Yard work, such as: Sales promotion account executive Gardening Washing windows or floors Patient will be able to explain general safety guidelines for exercising:  Before you start a new exercise program, talk with your health care provider. Do not exercise so much that you hurt yourself, feel dizzy, or get very short of breath. Wear comfortable clothes and wear shoes with good support. Drink plenty of water while you exercise to prevent dehydration or heat stroke. Work out until your breathing and your heartbeat get faster.        Personalized Health Maintenance & Screening Recommendations  Influenza vaccine  Lung Cancer Screening Recommended: no (Low Dose CT Chest recommended if Age 27-80 years, 30 pack-year currently smoking OR have quit w/in past 15 years) Hepatitis C Screening recommended: no HIV Screening recommended: no  Advanced Directives: Written information was not prepared per patient's request.  Referrals & Orders No orders of the defined types were placed in this encounter.   Follow-up Plan Follow-up with Sharion Balloon, FNP as planned Eye Exam Shingrix vaccine  Patient declined after visit summary.   I have personally reviewed and noted the following in the patient's chart:   Medical and social history Use of alcohol, tobacco or illicit drugs  Current medications and supplements Functional ability and status Nutritional status Physical activity Advanced directives List of other physicians Hospitalizations, surgeries, and ER visits in previous 12 months Vitals Screenings to include cognitive,  depression, and falls Referrals and appointments  In addition, I have reviewed and discussed with Gabriella Ferguson certain preventive protocols, quality metrics, and best practice recommendations. A written personalized care plan for preventive services as well as general preventive health recommendations is available and can be mailed to the patient at her request.      Felicity Coyer, LPN     [id number]   I have reviewed and agree with the above AWV documentation.   [name], FNP

## 2020-02-03 ENCOUNTER — Other Ambulatory Visit: Payer: Self-pay | Admitting: Family

## 2020-02-03 DIAGNOSIS — G629 Polyneuropathy, unspecified: Secondary | ICD-10-CM

## 2020-02-03 DIAGNOSIS — M810 Age-related osteoporosis without current pathological fracture: Secondary | ICD-10-CM

## 2020-02-04 ENCOUNTER — Ambulatory Visit (INDEPENDENT_AMBULATORY_CARE_PROVIDER_SITE_OTHER): Payer: Medicare Other | Admitting: Family

## 2020-02-04 ENCOUNTER — Other Ambulatory Visit: Payer: Self-pay

## 2020-02-04 ENCOUNTER — Encounter: Payer: Self-pay | Admitting: Family

## 2020-02-04 VITALS — BP 154/68 | HR 96 | Temp 97.1°F | Ht 60.0 in | Wt 174.4 lb

## 2020-02-04 DIAGNOSIS — E1165 Type 2 diabetes mellitus with hyperglycemia: Secondary | ICD-10-CM | POA: Diagnosis not present

## 2020-02-04 LAB — BAYER DCA HB A1C WAIVED: HB A1C (BAYER DCA - WAIVED): 10.5 % — ABNORMAL HIGH (ref <7.0)

## 2020-02-04 MED ORDER — SITAGLIPTIN PHOSPHATE 50 MG PO TABS: 50.0000 mg | ORAL_TABLET | Freq: Every day | ORAL | 2 refills | Status: DC

## 2020-02-04 MED ORDER — METFORMIN HCL 500 MG PO TABS: 500.0000 mg | ORAL_TABLET | Freq: Two times a day (BID) | ORAL | 3 refills | Status: DC

## 2020-02-04 NOTE — Patient Instructions (Signed)

## 2020-02-04 NOTE — Progress Notes (Signed)
Subjective:    Patient ID: Gabriella Ferguson, female    DOB: 02-28-1929, 86 y.o.   MRN: [id number]  Chief Complaint  Patient presents with  . Diabetes   PT presents to the office today to recheck DM. She does not check BS at home. She reports she has taken all of her medications. She reports she has not changed her diet and eats usually what she wants.  Diabetes She presents for her follow-up diabetic visit. She has type 2 diabetes mellitus. Her disease course has been stable. There are no hypoglycemic associated symptoms. Associated symptoms include foot paresthesias. Pertinent negatives for diabetes include no blurred vision. Symptoms are stable. Diabetic complications include peripheral neuropathy. Risk factors for coronary artery disease include diabetes mellitus and dyslipidemia. She is following a generally unhealthy diet. (Does not check at home )      Review of Systems  Eyes: Negative for blurred vision.  All other systems reviewed and are negative.      Objective:   Physical Exam Vitals reviewed.  Constitutional:      General: She is not in acute distress.    Appearance: She is well-developed. She is obese.  HENT:     Head: Normocephalic and atraumatic.  Eyes:     Pupils: Pupils are equal, round, and reactive to light.  Neck:     Thyroid: No thyromegaly.  Cardiovascular:     Rate and Rhythm: Normal rate and regular rhythm.     Heart sounds: Normal heart sounds. No murmur heard.   Pulmonary:     Effort: Pulmonary effort is normal. No respiratory distress.     Breath sounds: Normal breath sounds. No wheezing.  Abdominal:     General: Bowel sounds are normal. There is no distension.     Palpations: Abdomen is soft.     Tenderness: There is no abdominal tenderness.  Musculoskeletal:        General: No tenderness. Normal range of motion.     Cervical back: Normal range of motion and neck supple.  Skin:    General: Skin is warm and dry.  Neurological:      Mental Status: She is alert and oriented to person, place, and time.     Cranial Nerves: No cranial nerve deficit.     Deep Tendon Reflexes: Reflexes are normal and symmetric.  Psychiatric:        Behavior: Behavior normal.        Thought Content: Thought content normal.        Judgment: Judgment normal.       BP (!) 154/68   Pulse 96   Temp (!) 97.1 F (36.2 C) (Temporal)   Ht 5' (1.524 m)   Wt 174 lb 6.4 oz (79.1 kg)   SpO2 96%   BMI 34.06 kg/m      Assessment & Plan:  Gabriella Ferguson comes in today with chief complaint of Diabetes   Diagnosis and orders addressed:  1. Type 2 diabetes mellitus with hyperglycemia, without long-term current use of insulin (HCC) Continue current medications Keep appt with Clinical Pharm RTO in 2 months - metFORMIN (GLUCOPHAGE) 500 MG tablet; Take 1 tablet (500 mg total) by mouth 2 (two) times daily with a meal.  Dispense: 180 tablet; Refill: 3 - sitaGLIPtin (JANUVIA) 50 MG tablet; Take 1 tablet (50 mg total) by mouth daily.  Dispense: 90 tablet; Refill: 2 - Bayer DCA Hb A1c Waived - BMP8+EGFR     [name], FNP

## 2020-02-05 LAB — BMP8+EGFR
BUN/Creatinine Ratio: 15 (ref 12–28)
BUN: 17 mg/dL (ref 8–27)
CO2: 24 mmol/L (ref 20–29)
Calcium: 9.3 mg/dL (ref 8.7–10.3)
Chloride: 101 mmol/L (ref 96–106)
Creatinine, Ser: 1.16 mg/dL — ABNORMAL HIGH (ref 0.57–1.00)
GFR calc Af Amer: 49 mL/min/{1.73_m2} — ABNORMAL LOW (ref >59)
GFR calc non Af Amer: 42 mL/min/{1.73_m2} — ABNORMAL LOW (ref >59)
Glucose: 320 mg/dL — ABNORMAL HIGH (ref 65–99)
Potassium: 4.8 mmol/L (ref 3.5–5.2)
Sodium: 142 mmol/L (ref 134–144)

## 2020-02-05 NOTE — Progress Notes (Signed)
Ac na

## 2020-02-11 ENCOUNTER — Ambulatory Visit: Payer: Medicare Other | Admitting: Pharmacist

## 2020-02-12 ENCOUNTER — Ambulatory Visit: Payer: Medicare Other | Admitting: Pharmacist

## 2020-03-09 ENCOUNTER — Other Ambulatory Visit: Payer: Self-pay | Admitting: Family

## 2020-03-09 DIAGNOSIS — I10 Essential (primary) hypertension: Secondary | ICD-10-CM

## 2020-03-24 ENCOUNTER — Encounter: Payer: Self-pay | Admitting: Family

## 2020-03-24 ENCOUNTER — Ambulatory Visit (INDEPENDENT_AMBULATORY_CARE_PROVIDER_SITE_OTHER): Payer: Medicare Other | Admitting: Family

## 2020-03-24 ENCOUNTER — Other Ambulatory Visit: Payer: Self-pay

## 2020-03-24 VITALS — BP 139/72 | HR 91 | Temp 97.1°F | Ht 60.0 in | Wt 171.8 lb

## 2020-03-24 DIAGNOSIS — N3001 Acute cystitis with hematuria: Secondary | ICD-10-CM | POA: Diagnosis not present

## 2020-03-24 DIAGNOSIS — R350 Frequency of micturition: Secondary | ICD-10-CM | POA: Diagnosis not present

## 2020-03-24 LAB — URINALYSIS, COMPLETE
Bilirubin, UA: NEGATIVE
Nitrite, UA: NEGATIVE
Specific Gravity, UA: 1.025 (ref 1.005–1.030)
Urobilinogen, Ur: 0.2 mg/dL (ref 0.2–1.0)
pH, UA: 5 (ref 5.0–7.5)

## 2020-03-24 LAB — MICROSCOPIC EXAMINATION: WBC, UA: >30 /hpf — AB (ref 0–5)

## 2020-03-24 MED ORDER — CEPHALEXIN 500 MG PO CAPS: 500.0000 mg | ORAL_CAPSULE | Freq: Two times a day (BID) | ORAL | 0 refills | Status: DC

## 2020-03-24 NOTE — Patient Instructions (Signed)

## 2020-03-24 NOTE — Progress Notes (Signed)
   Subjective:    Patient ID: Gabriella Ferguson, female    DOB: [birth year], 86 y.o.   MRN: [id number]  Chief Complaint  Patient presents with  . Dysuria    Dysuria  This is a new problem. The current episode started 1 to 4 weeks ago. The problem occurs every urination. The problem has been gradually worsening. The quality of the pain is described as burning. The pain is at a severity of 5/10. The pain is mild. There has been no fever. Associated symptoms include frequency, hesitancy, nausea and urgency. Pertinent negatives include no discharge, flank pain, hematuria or vomiting. She has tried increased fluids (AZO) for the symptoms. The treatment provided mild relief.      Review of Systems  Gastrointestinal: Positive for nausea. Negative for vomiting.  Genitourinary: Positive for dysuria, frequency, hesitancy and urgency. Negative for flank pain and hematuria.  All other systems reviewed and are negative.      Objective:   Physical Exam Vitals reviewed.  Constitutional:      General: She is not in acute distress.    Appearance: She is well-developed.  HENT:     Head: Normocephalic and atraumatic.  Eyes:     Pupils: Pupils are equal, round, and reactive to light.  Neck:     Thyroid: No thyromegaly.  Cardiovascular:     Rate and Rhythm: Normal rate and regular rhythm.     Heart sounds: Normal heart sounds. No murmur heard.   Pulmonary:     Effort: Pulmonary effort is normal. No respiratory distress.     Breath sounds: Normal breath sounds. No wheezing.  Abdominal:     General: Bowel sounds are normal. There is no distension.     Palpations: Abdomen is soft.     Tenderness: There is no abdominal tenderness.  Musculoskeletal:        General: No tenderness. Normal range of motion.     Cervical back: Normal range of motion and neck supple.     Right lower leg: Edema (trace) present.     Left lower leg: Edema (trace) present.  Skin:    General: Skin is warm and dry.    Neurological:     Mental Status: She is alert and oriented to person, place, and time.     Cranial Nerves: No cranial nerve deficit.     Deep Tendon Reflexes: Reflexes are normal and symmetric.  Psychiatric:        Behavior: Behavior normal.        Thought Content: Thought content normal.        Judgment: Judgment normal.       BP (!) 139/72   Pulse 91   Temp (!) 97.1 F (36.2 C) (Temporal)   Ht 5' (1.524 m)   Wt 171 lb 12.8 oz (77.9 kg)   SpO2 94%   BMI 33.55 kg/m      Assessment & Plan:  Frederich Balding Dengel comes in today with chief complaint of Dysuria   Diagnosis and orders addressed:  1. Urinary frequency - Urinalysis, Complete - Urine Culture  2. Acute cystitis with hematuria Force fluids RTO if symptoms worsen or do not improve, I want her to bring a urine back after completion of Keflex Culture pending - cephALEXin (KEFLEX) 500 MG capsule; Take 1 capsule (500 mg total) by mouth 2 (two) times daily.  Dispense: 14 capsule; Refill: 0   [name], FNP

## 2020-03-29 LAB — URINE CULTURE

## 2020-04-05 ENCOUNTER — Ambulatory Visit (INDEPENDENT_AMBULATORY_CARE_PROVIDER_SITE_OTHER): Payer: Medicare Other | Admitting: Family

## 2020-04-05 ENCOUNTER — Encounter: Payer: Self-pay | Admitting: Family

## 2020-04-05 ENCOUNTER — Other Ambulatory Visit: Payer: Self-pay

## 2020-04-05 VITALS — BP 127/72 | HR 96 | Temp 98.2°F | Ht 60.0 in | Wt 170.2 lb

## 2020-04-05 DIAGNOSIS — I10 Essential (primary) hypertension: Secondary | ICD-10-CM

## 2020-04-05 DIAGNOSIS — B3731 Acute candidiasis of vulva and vagina: Secondary | ICD-10-CM

## 2020-04-05 DIAGNOSIS — E1165 Type 2 diabetes mellitus with hyperglycemia: Secondary | ICD-10-CM

## 2020-04-05 DIAGNOSIS — R300 Dysuria: Secondary | ICD-10-CM | POA: Diagnosis not present

## 2020-04-05 DIAGNOSIS — E1169 Type 2 diabetes mellitus with other specified complication: Secondary | ICD-10-CM

## 2020-04-05 DIAGNOSIS — K219 Gastro-esophageal reflux disease without esophagitis: Secondary | ICD-10-CM | POA: Diagnosis not present

## 2020-04-05 DIAGNOSIS — K5900 Constipation, unspecified: Secondary | ICD-10-CM

## 2020-04-05 DIAGNOSIS — E785 Hyperlipidemia, unspecified: Secondary | ICD-10-CM

## 2020-04-05 DIAGNOSIS — B373 Candidiasis of vulva and vagina: Secondary | ICD-10-CM

## 2020-04-05 LAB — URINALYSIS, COMPLETE
Bilirubin, UA: NEGATIVE
Nitrite, UA: NEGATIVE
Specific Gravity, UA: 1.015 (ref 1.005–1.030)
Urobilinogen, Ur: 0.2 mg/dL (ref 0.2–1.0)
pH, UA: 5 (ref 5.0–7.5)

## 2020-04-05 LAB — MICROSCOPIC EXAMINATION
Bacteria, UA: NONE SEEN
RBC, Urine: NONE SEEN /hpf (ref 0–2)

## 2020-04-05 LAB — WET PREP FOR TRICH, YEAST, CLUE
Clue Cell Exam: NEGATIVE
Trichomonas Exam: NEGATIVE
Yeast Exam: NEGATIVE

## 2020-04-05 MED ORDER — NYSTATIN 100000 UNIT/GM EX POWD: 1.0000 "application " | Freq: Three times a day (TID) | CUTANEOUS | 0 refills | Status: DC

## 2020-04-05 MED ORDER — FLUCONAZOLE 150 MG PO TABS: 150.0000 mg | ORAL_TABLET | ORAL | 0 refills | Status: DC | PRN

## 2020-04-05 NOTE — Patient Instructions (Signed)
Vaginal Yeast Infection, Adult  Vaginal yeast infection is a condition that causes vaginal discharge as well as soreness, swelling, and redness (inflammation) of the vagina. This is a common condition. Some women get this infection frequently. What are the causes? This condition is caused by a change in the normal balance of the yeast (candida) and bacteria that live in the vagina. This change causes an overgrowth of yeast, which causes the inflammation. What increases the risk? The condition is more likely to develop in women who:  Take antibiotic medicines.  Have diabetes.  Take birth control pills.  Are pregnant.  Douche often.  Have a weak body defense system (immune system).  Have been taking steroid medicines for a long time.  Frequently wear tight clothing. What are the signs or symptoms? Symptoms of this condition include:  White, thick, creamy vaginal discharge.  Swelling, itching, redness, and irritation of the vagina. The lips of the vagina (vulva) may be affected as well.  Pain or a burning feeling while urinating.  Pain during sex. How is this diagnosed? This condition is diagnosed based on:  Your medical history.  A physical exam.  A pelvic exam. Your health care provider will examine a sample of your vaginal discharge under a microscope. Your health care provider may send this sample for testing to confirm the diagnosis. How is this treated? This condition is treated with medicine. Medicines may be over-the-counter or prescription. You may be told to use one or more of the following:  Medicine that is taken by mouth (orally).  Medicine that is applied as a cream (topically).  Medicine that is inserted directly into the vagina (suppository). Follow these instructions at home:  Lifestyle  Do not have sex until your health care provider approves. Tell your sex partner that you have a yeast infection. That person should go to his or her health care  provider and ask if they should also be treated.  Do not wear tight clothes, such as pantyhose or tight pants.  Wear breathable cotton underwear. General instructions  Take or apply over-the-counter and prescription medicines only as told by your health care provider.  Eat more yogurt. This may help to keep your yeast infection from returning.  Do not use tampons until your health care provider approves.  Try taking a sitz bath to help with discomfort. This is a warm water bath that is taken while you are sitting down. The water should only come up to your hips and should cover your buttocks. Do this 3-4 times per day or as told by your health care provider.  Do not douche.  If you have diabetes, keep your blood sugar levels under control.  Keep all follow-up visits as told by your health care provider. This is important. Contact a health care provider if:  You have a fever.  Your symptoms go away and then return.  Your symptoms do not get better with treatment.  Your symptoms get worse.  You have new symptoms.  You develop blisters in or around your vagina.  You have blood coming from your vagina and it is not your menstrual period.  You develop pain in your abdomen. Summary  Vaginal yeast infection is a condition that causes discharge as well as soreness, swelling, and redness (inflammation) of the vagina.  This condition is treated with medicine. Medicines may be over-the-counter or prescription.  Take or apply over-the-counter and prescription medicines only as told by your health care provider.  Do not douche.   Do not have sex or use tampons until your health care provider approves.  Contact a health care provider if your symptoms do not get better with treatment or your symptoms go away and then return. This information is not intended to replace advice given to you by your health care provider. Make sure you discuss any questions you have with your health care  provider. Document Revised: 03/13/2019 Document Reviewed: 12/30/2017 [employer] Patient Education  2020 [employer]  

## 2020-04-05 NOTE — Progress Notes (Signed)
Subjective:    Patient ID: Gabriella Ferguson, female    DOB: [birth year], 86 y.o.   MRN: [id number]  Chief Complaint  Patient presents with  . Depression  . Dysuria    patient states her whole bottom hurts, burns when having BM. Patient has used diaper rash cream with no relief    PT presents to the office today for chronic follow up. She has uncontrolled DM with her A1C of 10.5%. She is unable to cook and eats a lot of frozen foods. She has is followed by our Clinical Pharm for diabetic education.   She has also had a recurrent UTI and now a yeast infection. She reports she is "red and raw" in her labia. She has put Vaseline with no relief.  Dysuria  This is a recurrent problem. The current episode started 1 to 4 weeks ago. The problem occurs intermittently. The problem has been waxing and waning. The quality of the pain is described as burning. The pain is at a severity of 10/10. The pain is moderate. There has been no fever. Associated symptoms include frequency and urgency. Pertinent negatives include no flank pain or hematuria. She has tried increased fluids and antibiotics for the symptoms. The treatment provided mild relief.  Hypertension This is a chronic problem. The current episode started more than 1 year ago. The problem has been resolved since onset. The problem is controlled. Associated symptoms include malaise/fatigue. Pertinent negatives include no blurred vision, peripheral edema or shortness of breath. Risk factors for coronary artery disease include dyslipidemia, obesity and sedentary lifestyle. The current treatment provides moderate improvement.  Diabetes She presents for her follow-up diabetic visit. She has type 2 diabetes mellitus. Her disease course has been stable. Associated symptoms include foot paresthesias. Pertinent negatives for diabetes include no blurred vision. Symptoms are stable. Diabetic complications include nephropathy and peripheral neuropathy. Risk factors  for coronary artery disease include dyslipidemia, diabetes mellitus, hypertension, sedentary lifestyle and post-menopausal. (Can not check BS at home)  Hyperlipidemia This is a chronic problem. The current episode started more than 1 year ago. Exacerbating diseases include obesity. Pertinent negatives include no shortness of breath. Current antihyperlipidemic treatment includes statins. The current treatment provides moderate improvement of lipids. Risk factors for coronary artery disease include hypertension, a sedentary lifestyle, post-menopausal and dyslipidemia.      Review of Systems  Constitutional: Positive for malaise/fatigue.  Eyes: Negative for blurred vision.  Respiratory: Negative for shortness of breath.   Genitourinary: Positive for dysuria, frequency and urgency. Negative for flank pain and hematuria.  All other systems reviewed and are negative.      Objective:   Physical Exam Vitals reviewed.  Constitutional:      General: She is not in acute distress.    Appearance: She is well-developed. She is obese.  HENT:     Head: Normocephalic and atraumatic.     Right Ear: Tympanic membrane normal.     Left Ear: Tympanic membrane normal.  Eyes:     Pupils: Pupils are equal, round, and reactive to light.  Neck:     Thyroid: No thyromegaly.  Cardiovascular:     Rate and Rhythm: Normal rate and regular rhythm.     Heart sounds: Normal heart sounds. No murmur heard.   Pulmonary:     Effort: Pulmonary effort is normal. No respiratory distress.     Breath sounds: Normal breath sounds. No wheezing.  Abdominal:     General: Bowel sounds are normal. There is no  distension.     Palpations: Abdomen is soft.     Tenderness: There is no abdominal tenderness.  Musculoskeletal:        General: No tenderness. Normal range of motion.     Cervical back: Normal range of motion and neck supple.  Skin:    General: Skin is warm and dry.  Neurological:     Mental Status: She is alert  and oriented to person, place, and time.     Cranial Nerves: No cranial nerve deficit.     Deep Tendon Reflexes: Reflexes are normal and symmetric.  Psychiatric:        Behavior: Behavior normal.        Thought Content: Thought content normal.        Judgment: Judgment normal.       BP 127/72   Pulse 96   Temp 98.2 F (36.8 C) (Temporal)   Ht 5' (1.524 m)   Wt 170 lb 3.2 oz (77.2 kg)   SpO2 93%   BMI 33.24 kg/m      Assessment & Plan:  Frederich Balding Desena comes in today with chief complaint of Depression and Dysuria (patient states her whole bottom hurts, burns when having BM. Patient has used diaper rash cream with no relief )   Diagnosis and orders addressed: 1. Essential hypertension - CMP14+EGFR - CBC with Differential/Platelet  2. Gastroesophageal reflux disease, unspecified whether esophagitis present - CMP14+EGFR - CBC with Differential/Platelet  3. Type 2 diabetes mellitus with hyperglycemia, without long-term current use of insulin (HCC) - Microalbumin / creatinine urine ratio - CMP14+EGFR - CBC with Differential/Platelet  4. Hyperlipidemia associated with type 2 diabetes mellitus ([place] - CMP14+EGFR - CBC with Differential/Platelet  5. Constipation, unspecified constipation type - CMP14+EGFR - CBC with Differential/Platelet  6. Dysuria - Urinalysis, Complete - Urine Culture - fluconazole (DIFLUCAN) 150 MG tablet; Take 1 tablet (150 mg total) by mouth every three (3) days as needed.  Dispense: 3 tablet; Refill: 0 - CMP14+EGFR - CBC with Differential/Platelet  7. Vagina, candidiasis - fluconazole (DIFLUCAN) 150 MG tablet; Take 1 tablet (150 mg total) by mouth every three (3) days as needed.  Dispense: 3 tablet; Refill: 0 - nystatin (MYCOSTATIN/NYSTOP) powder; Apply 1 application topically 3 (three) times daily.  Dispense: 15 g; Refill: 0 - CMP14+EGFR - CBC with Differential/Platelet - WET PREP FOR TRICH, YEAST, CLUE    Labs pending Health  Maintenance reviewed Diet and exercise encouraged  Follow up plan: 3 months    [name], FNP

## 2020-04-06 LAB — MICROALBUMIN / CREATININE URINE RATIO
Creatinine, Urine: 129.8 mg/dL
Microalb/Creat Ratio: 44 mg/g creat — ABNORMAL HIGH (ref 0–29)
Microalbumin, Urine: 56.7 ug/mL

## 2020-04-08 ENCOUNTER — Other Ambulatory Visit: Payer: Self-pay | Admitting: Family

## 2020-04-08 LAB — URINE CULTURE

## 2020-04-08 MED ORDER — FLUCONAZOLE 150 MG PO TABS
150.0000 mg | ORAL_TABLET | ORAL | 0 refills | Status: DC | PRN
Start: 2020-04-08 — End: 2020-06-10

## 2020-04-28 ENCOUNTER — Other Ambulatory Visit: Payer: Self-pay | Admitting: Family

## 2020-04-28 DIAGNOSIS — G629 Polyneuropathy, unspecified: Secondary | ICD-10-CM

## 2020-04-28 DIAGNOSIS — M810 Age-related osteoporosis without current pathological fracture: Secondary | ICD-10-CM

## 2020-06-07 ENCOUNTER — Other Ambulatory Visit: Payer: Self-pay | Admitting: Family

## 2020-06-07 DIAGNOSIS — I10 Essential (primary) hypertension: Secondary | ICD-10-CM

## 2020-06-10 ENCOUNTER — Encounter: Payer: Self-pay | Admitting: Nurse Practitioner

## 2020-06-10 ENCOUNTER — Ambulatory Visit (INDEPENDENT_AMBULATORY_CARE_PROVIDER_SITE_OTHER): Payer: Medicare Other | Admitting: Nurse Practitioner

## 2020-06-10 ENCOUNTER — Other Ambulatory Visit: Payer: Self-pay

## 2020-06-10 VITALS — BP 138/76 | HR 90 | Temp 97.4°F | Resp 20 | Ht 60.0 in | Wt 167.0 lb

## 2020-06-10 DIAGNOSIS — Z23 Encounter for immunization: Secondary | ICD-10-CM

## 2020-06-10 DIAGNOSIS — R300 Dysuria: Secondary | ICD-10-CM | POA: Diagnosis not present

## 2020-06-10 DIAGNOSIS — N390 Urinary tract infection, site not specified: Secondary | ICD-10-CM | POA: Insufficient documentation

## 2020-06-10 LAB — URINALYSIS, COMPLETE
Bilirubin, UA: NEGATIVE
Nitrite, UA: NEGATIVE
Specific Gravity, UA: 1.02 (ref 1.005–1.030)
Urobilinogen, Ur: 0.2 mg/dL (ref 0.2–1.0)
pH, UA: 6 (ref 5.0–7.5)

## 2020-06-10 LAB — MICROSCOPIC EXAMINATION: WBC, UA: >30 /hpf — AB (ref 0–5)

## 2020-06-10 MED ORDER — NITROFURANTOIN MONOHYD MACRO 100 MG PO CAPS: 100.0000 mg | ORAL_CAPSULE | Freq: Two times a day (BID) | ORAL | 0 refills | Status: DC

## 2020-06-10 NOTE — Patient Instructions (Signed)

## 2020-06-10 NOTE — Progress Notes (Addendum)
Acute Office Visit  Subjective:    Patient ID: Gabriella Ferguson, female    DOB: 1928-11-11, 86 y.o.   MRN: [id number]  Chief Complaint  Patient presents with  . Dysuria    Dysuria  This is a new problem. The current episode started in the past 7 days. The problem occurs every urination. The problem has been unchanged. The quality of the pain is described as burning. The pain is at a severity of 7/10. There has been no fever. She is not sexually active. There is no history of pyelonephritis. Associated symptoms include urgency. Pertinent negatives include no chills, flank pain, hematuria or vomiting. She has tried nothing for the symptoms.    Past Medical History:  Diagnosis Date  . Breast cancer ([place])   . Diabetes mellitus without complication (HCC)    Type 2  . Hip fracture ([place])   . Meningitis     Past Surgical History:  Procedure Laterality Date  . HIP CLOSED REDUCTION      Family History  Problem Relation Age of Onset  . Diabetes Daughter   . Stroke Mother   . Stroke Father       Outpatient Medications Prior to Visit  Medication Sig Dispense Refill  . alendronate (FOSAMAX) 70 MG tablet Take with a full glass of water on an empty stomach. 12 tablet 0  . aspirin EC 81 MG tablet Take 81 mg by mouth daily.    Marland Kitchen gabapentin (NEURONTIN) 300 MG capsule TAKE (1) CAPSULE TWICE DAILY. 180 capsule 0  . glipiZIDE (GLUCOTROL XL) 2.5 MG 24 hr tablet Take 1 tablet (2.5 mg total) by mouth daily with breakfast. 90 tablet 3  . hydrochlorothiazide (MICROZIDE) 12.5 MG capsule TAKE (1) CAPSULE DAILY 90 capsule 0  . metFORMIN (GLUCOPHAGE) 500 MG tablet Take 1 tablet (500 mg total) by mouth 2 (two) times daily with a meal. 180 tablet 3  . Multiple Vitamins-Minerals (CENTRUM ADULTS) TABS Take by mouth.    . nystatin (MYCOSTATIN/NYSTOP) powder Apply 1 application topically 3 (three) times daily. 15 g 0  . simvastatin (ZOCOR) 20 MG tablet Take 1 tablet (20 mg total) by mouth daily. 90  tablet 3  . sitaGLIPtin (JANUVIA) 50 MG tablet Take 1 tablet (50 mg total) by mouth daily. 90 tablet 2  . fluconazole (DIFLUCAN) 150 MG tablet Take 1 tablet (150 mg total) by mouth every three (3) days as needed. 3 tablet 0  . loratadine (CLARITIN) 10 MG tablet Take 10 mg by mouth daily as needed. (Patient not taking: Reported on 03/24/2020)     No facility-administered medications prior to visit.    Allergies  Allergen Reactions  . Semaglutide     Red patches on skin s/p Rybelsus     Review of Systems  Constitutional: Negative for chills.  Gastrointestinal: Negative for vomiting.  Genitourinary: Positive for dysuria and urgency. Negative for flank pain and hematuria.  All other systems reviewed and are negative.      Objective:    Physical Exam Vitals reviewed.  Constitutional:      Appearance: Normal appearance.  HENT:     Head: Normocephalic.  Eyes:     Conjunctiva/sclera: Conjunctivae normal.  Cardiovascular:     Rate and Rhythm: Normal rate and regular rhythm.     Pulses: Normal pulses.     Heart sounds: Normal heart sounds.  Pulmonary:     Effort: Pulmonary effort is normal.     Breath sounds: Normal breath sounds.  Abdominal:  General: Bowel sounds are normal.  Genitourinary:    Comments: Burning with urination, urinary urgency. Skin:    General: Skin is warm.  Neurological:     Mental Status: She is alert and oriented to person, place, and time.  Psychiatric:        Behavior: Behavior normal.     BP 138/76   Pulse 90   Temp (!) 97.4 F (36.3 C) (Temporal)   Resp 20   Ht 5' (1.524 m)   Wt 167 lb (75.8 kg)   SpO2 98%   BMI 32.61 kg/m for Wt Readings from Last 3 Encounters:  06/10/20 167 lb (75.8 kg)  04/05/20 170 lb 3.2 oz (77.2 kg)  03/24/20 171 lb 12.8 oz (77.9 kg)    Health Maintenance Due  Topic Date Due  . OPHTHALMOLOGY EXAM  10/08/2019    There are no preventive care reminders to display for this patient.   No results found  for: TSH Lab Results  Component Value Date   WBC 9.4 09/21/2019   HGB 11.4 09/21/2019   HCT 35.8 09/21/2019   MCV 87 09/21/2019   PLT 230 09/21/2019   Lab Results  Component Value Date   NA 142 02/04/2020   K 4.8 02/04/2020   CO2 24 02/04/2020   GLUCOSE 320 (H) 02/04/2020   BUN 17 02/04/2020   CREATININE 1.16 (H) 02/04/2020   BILITOT 0.3 10/22/2019   ALKPHOS 52 10/22/2019   AST 15 10/22/2019   ALT 11 10/22/2019   PROT 6.4 10/22/2019   ALBUMIN 4.1 10/22/2019   CALCIUM 9.3 02/04/2020   Lab Results  Component Value Date   CHOL 132 09/21/2019   Lab Results  Component Value Date   HDL 48 09/21/2019   Lab Results  Component Value Date   LDLCALC 53 09/21/2019   Lab Results  Component Value Date   TRIG 192 (H) 09/21/2019   Lab Results  Component Value Date   CHOLHDL 2.8 09/21/2019   Lab Results  Component Value Date   HGBA1C 10.5 (H) 02/04/2020       Assessment & Plan:   Problem List Items Addressed This Visit      Other   Dysuria - Primary    Patient is a 86 year old female who presents to clinic with dysuria and urinary urgency.  This has been ongoing in the last 5 to 7 days.  Patient reports that this is a new problem for her and has no history of urinary tract infection or pyelonephritis.  Completed assessment patient is burning.  Complicated UTI cultures lab pending.  Provided education to patient with printed handouts given.  Encourage patient to stay hydrated. Follow-up with worsening or unresolved symptoms.      Relevant Orders   Urinalysis, Complete   Urine Culture   Need for immunization against influenza   Relevant Orders   Flu Vaccine QUAD High Dose(Fluad) (Completed)       Meds ordered this encounter  Medications  . nitrofurantoin, macrocrystal-monohydrate, (MACROBID) 100 MG capsule    Sig: Take 1 capsule (100 mg total) by mouth 2 (two) times daily.    Dispense:  14 capsule    Refill:  0    Order Specific Question:   Supervising  Provider    Answer:   Caryl Pina A [[id number]]     Ivy Lynn, NP

## 2020-06-10 NOTE — Assessment & Plan Note (Addendum)
Patient is a 86 year old female who presents to clinic with dysuria and urinary urgency.  This has been ongoing in the last 5 to 7 days.  Patient reports that this is a new problem for her and has no history of urinary tract infection or pyelonephritis.  Completed assessment patient is burning.  Complicated UTI cultures lab pending.  Provided education to patient with printed handouts given.  Encourage patient to stay hydrated. Started patient on nitrofurantoin 100 mg twice daily Rx sent to pharmacy. Follow-up with worsening or unresolved symptoms.

## 2020-06-14 LAB — URINE CULTURE

## 2020-06-16 ENCOUNTER — Other Ambulatory Visit: Payer: Self-pay | Admitting: Nurse Practitioner

## 2020-06-16 MED ORDER — CEPHALEXIN 500 MG PO CAPS: 500.0000 mg | ORAL_CAPSULE | Freq: Two times a day (BID) | ORAL | 0 refills | Status: DC

## 2020-07-07 ENCOUNTER — Encounter: Payer: Self-pay | Admitting: Family

## 2020-07-07 ENCOUNTER — Other Ambulatory Visit: Payer: Self-pay

## 2020-07-07 ENCOUNTER — Ambulatory Visit (INDEPENDENT_AMBULATORY_CARE_PROVIDER_SITE_OTHER): Payer: Medicare Other | Admitting: Family

## 2020-07-07 VITALS — BP 132/60 | HR 70 | Temp 97.9°F | Ht 60.0 in | Wt 167.8 lb

## 2020-07-07 DIAGNOSIS — R300 Dysuria: Secondary | ICD-10-CM | POA: Diagnosis not present

## 2020-07-07 DIAGNOSIS — E1169 Type 2 diabetes mellitus with other specified complication: Secondary | ICD-10-CM

## 2020-07-07 DIAGNOSIS — E785 Hyperlipidemia, unspecified: Secondary | ICD-10-CM

## 2020-07-07 DIAGNOSIS — K219 Gastro-esophageal reflux disease without esophagitis: Secondary | ICD-10-CM

## 2020-07-07 DIAGNOSIS — I10 Essential (primary) hypertension: Secondary | ICD-10-CM

## 2020-07-07 DIAGNOSIS — K5900 Constipation, unspecified: Secondary | ICD-10-CM

## 2020-07-07 DIAGNOSIS — E1165 Type 2 diabetes mellitus with hyperglycemia: Secondary | ICD-10-CM | POA: Diagnosis not present

## 2020-07-07 DIAGNOSIS — M8000XD Age-related osteoporosis with current pathological fracture, unspecified site, subsequent encounter for fracture with routine healing: Secondary | ICD-10-CM

## 2020-07-07 DIAGNOSIS — N390 Urinary tract infection, site not specified: Secondary | ICD-10-CM

## 2020-07-07 LAB — CMP14+EGFR
ALT: 9 IU/L (ref 0–32)
AST: 15 IU/L (ref 0–40)
Albumin/Globulin Ratio: 1.4 (ref 1.2–2.2)
Albumin: 3.7 g/dL (ref 3.6–4.6)
Alkaline Phosphatase: 71 IU/L (ref 44–121)
BUN/Creatinine Ratio: 16 (ref 12–28)
BUN: 18 mg/dL (ref 8–27)
Bilirubin Total: 0.3 mg/dL (ref 0.0–1.2)
CO2: 26 mmol/L (ref 20–29)
Calcium: 9.3 mg/dL (ref 8.7–10.3)
Chloride: 96 mmol/L (ref 96–106)
Creatinine, Ser: 1.16 mg/dL — ABNORMAL HIGH (ref 0.57–1.00)
GFR calc Af Amer: 48 mL/min/{1.73_m2} — ABNORMAL LOW (ref >59)
GFR calc non Af Amer: 42 mL/min/{1.73_m2} — ABNORMAL LOW (ref >59)
Globulin, Total: 2.7 g/dL (ref 1.5–4.5)
Glucose: 299 mg/dL — ABNORMAL HIGH (ref 65–99)
Potassium: 4.6 mmol/L (ref 3.5–5.2)
Sodium: 135 mmol/L (ref 134–144)
Total Protein: 6.4 g/dL (ref 6.0–8.5)

## 2020-07-07 LAB — CBC WITH DIFFERENTIAL/PLATELET
Basophils Absolute: 0 10*3/uL (ref 0.0–0.2)
Basos: 0 %
EOS (ABSOLUTE): 0.1 10*3/uL (ref 0.0–0.4)
Eos: 2 %
Hematocrit: 35.9 % (ref 34.0–46.6)
Hemoglobin: 11.6 g/dL (ref 11.1–15.9)
Immature Grans (Abs): 0 10*3/uL (ref 0.0–0.1)
Immature Granulocytes: 0 %
Lymphocytes Absolute: 2.6 10*3/uL (ref 0.7–3.1)
Lymphs: 35 %
MCH: 28.2 pg (ref 26.6–33.0)
MCHC: 32.3 g/dL (ref 31.5–35.7)
MCV: 87 fL (ref 79–97)
Monocytes Absolute: 0.7 10*3/uL (ref 0.1–0.9)
Monocytes: 10 %
Neutrophils Absolute: 3.9 10*3/uL (ref 1.4–7.0)
Neutrophils: 53 %
Platelets: 240 10*3/uL (ref 150–450)
RBC: 4.11 x10E6/uL (ref 3.77–5.28)
RDW: 12.9 % (ref 11.7–15.4)
WBC: 7.3 10*3/uL (ref 3.4–10.8)

## 2020-07-07 LAB — BAYER DCA HB A1C WAIVED: HB A1C (BAYER DCA - WAIVED): 13.2 % — ABNORMAL HIGH (ref <7.0)

## 2020-07-07 MED ORDER — CEPHALEXIN 500 MG PO CAPS: 500.0000 mg | ORAL_CAPSULE | Freq: Two times a day (BID) | ORAL | 0 refills | Status: DC

## 2020-07-07 MED ORDER — CEPHALEXIN 250 MG PO CAPS: 250.0000 mg | ORAL_CAPSULE | Freq: Every day | ORAL | 1 refills | Status: DC

## 2020-07-07 NOTE — Patient Instructions (Signed)
Urinary Tract Infection, Adult A urinary tract infection (UTI) is an infection of any part of the urinary tract. The urinary tract includes:  The kidneys.  The ureters.  The bladder.  The urethra. These organs make, store, and get rid of pee (urine) in the body. What are the causes? This is caused by germs (bacteria) in your genital area. These germs grow and cause swelling (inflammation) of your urinary tract. What increases the risk? You are more likely to develop this condition if:  You have a small, thin tube (catheter) to drain pee.  You cannot control when you pee or poop (incontinence).  You are female, and: ? You use these methods to prevent pregnancy:  A medicine that kills sperm (spermicide).  A device that blocks sperm (diaphragm). ? You have low levels of a female hormone (estrogen). ? You are pregnant.  You have genes that add to your risk.  You are sexually active.  You take antibiotic medicines.  You have trouble peeing because of: ? A prostate that is bigger than normal, if you are female. ? A blockage in the part of your body that drains pee from the bladder (urethra). ? A kidney stone. ? A nerve condition that affects your bladder (neurogenic bladder). ? Not getting enough to drink. ? Not peeing often enough.  You have other conditions, such as: ? Diabetes. ? A weak disease-fighting system (immune system). ? Sickle cell disease. ? Gout. ? Injury of the spine. What are the signs or symptoms? Symptoms of this condition include:  Needing to pee right away (urgently).  Peeing often.  Peeing small amounts often.  Pain or burning when peeing.  Blood in the pee.  Pee that smells bad or not like normal.  Trouble peeing.  Pee that is cloudy.  Fluid coming from the vagina, if you are female.  Pain in the belly or lower back. Other symptoms include:  Throwing up (vomiting).  No urge to eat.  Feeling mixed up (confused).  Being tired  and grouchy (irritable).  A fever.  Watery poop (diarrhea). How is this treated? This condition may be treated with:  Antibiotic medicine.  Other medicines.  Drinking enough water. Follow these instructions at home:  Medicines  Take over-the-counter and prescription medicines only as told by your doctor.  If you were prescribed an antibiotic medicine, take it as told by your doctor. Do not stop taking it even if you start to feel better. General instructions  Make sure you: ? Pee until your bladder is empty. ? Do not hold pee for a long time. ? Empty your bladder after sex. ? Wipe from front to back after pooping if you are a female. Use each tissue one time when you wipe.  Drink enough fluid to keep your pee pale yellow.  Keep all follow-up visits as told by your doctor. This is important. Contact a doctor if:  You do not get better after 1-2 days.  Your symptoms go away and then come back. Get help right away if:  You have very bad back pain.  You have very bad pain in your lower belly.  You have a fever.  You are sick to your stomach (nauseous).  You are throwing up. Summary  A urinary tract infection (UTI) is an infection of any part of the urinary tract.  This condition is caused by germs in your genital area.  There are many risk factors for a UTI. These include having a small, thin   tube to drain pee and not being able to control when you pee or poop.  Treatment includes antibiotic medicines for germs.  Drink enough fluid to keep your pee pale yellow. This information is not intended to replace advice given to you by your health care provider. Make sure you discuss any questions you have with your health care provider. Document Revised: 07/31/2018 Document Reviewed: 02/20/2018 Elsevier Patient Education  2020 [employer]  

## 2020-07-07 NOTE — Progress Notes (Signed)
Subjective:    Patient ID: Gabriella Ferguson, female    DOB: [birth year], 86 y.o.   MRN: [id number]  Chief Complaint  Patient presents with  . Diabetes  . Hypertension  . Dysuria   PT presents to the office today for chronic follow up. She has uncontrolled DM with her A1C of 10.5%. She is unable to cook and eats a lot of frozen foods. She has is followed by our Clinical Pharm for diabetic education.   She also complaining of a recurrent UTI. Diabetes She presents for her follow-up diabetic visit. She has type 2 diabetes mellitus. Her disease course has been worsening. There are no hypoglycemic associated symptoms. Pertinent negatives for diabetes include no blurred vision and no foot paresthesias. There are no hypoglycemic complications. Symptoms are stable. Pertinent negatives for diabetic complications include no CVA, heart disease or peripheral neuropathy. Risk factors for coronary artery disease include dyslipidemia, diabetes mellitus, hypertension, sedentary lifestyle and post-menopausal. She is following a generally unhealthy diet. (Does not check her BS at home) Eye exam is current.  Hypertension This is a chronic problem. The current episode started more than 1 year ago. The problem has been resolved since onset. The problem is controlled. Associated symptoms include malaise/fatigue. Pertinent negatives include no blurred vision, peripheral edema or shortness of breath. Risk factors for coronary artery disease include dyslipidemia, diabetes mellitus, obesity and sedentary lifestyle. The current treatment provides moderate improvement. There is no history of CVA.  Dysuria  This is a recurrent problem. The current episode started more than 1 month ago. The problem occurs intermittently. The problem has been waxing and waning. The quality of the pain is described as burning. The pain is moderate. Associated symptoms include frequency and hematuria. She has tried antibiotics for the symptoms.  The treatment provided mild relief.  Gastroesophageal Reflux She complains of belching and heartburn. This is a chronic problem. The current episode started more than 1 year ago. The problem occurs occasionally. The problem has been waxing and waning. She has tried an antacid for the symptoms. The treatment provided moderate relief.  Hyperlipidemia This is a chronic problem. The current episode started more than 1 year ago. The problem is controlled. Recent lipid tests were reviewed and are normal. Exacerbating diseases include obesity. Pertinent negatives include no shortness of breath. Current antihyperlipidemic treatment includes statins. The current treatment provides moderate improvement of lipids.  Constipation This is a chronic problem. The current episode started more than 1 year ago. She has tried laxatives for the symptoms. The treatment provided moderate relief.      Review of Systems  Constitutional: Positive for malaise/fatigue.  Eyes: Negative for blurred vision.  Respiratory: Negative for shortness of breath.   Gastrointestinal: Positive for constipation and heartburn.  Genitourinary: Positive for dysuria, frequency and hematuria.  All other systems reviewed and are negative.      Objective:   Physical Exam Vitals reviewed.  Constitutional:      General: She is not in acute distress.    Appearance: She is well-developed.  HENT:     Head: Normocephalic and atraumatic.     Right Ear: Tympanic membrane normal.     Left Ear: Tympanic membrane normal.  Eyes:     Pupils: Pupils are equal, round, and reactive to light.  Neck:     Thyroid: No thyromegaly.  Cardiovascular:     Rate and Rhythm: Normal rate and regular rhythm.     Heart sounds: Normal heart sounds. No murmur heard.  Pulmonary:     Effort: Pulmonary effort is normal. No respiratory distress.     Breath sounds: Normal breath sounds. No wheezing.  Abdominal:     General: Bowel sounds are normal. There is  no distension.     Palpations: Abdomen is soft.     Tenderness: There is no abdominal tenderness.  Musculoskeletal:        General: No tenderness. Normal range of motion.     Cervical back: Normal range of motion and neck supple.  Skin:    General: Skin is warm and dry.  Neurological:     Mental Status: She is alert and oriented to person, place, and time.     Cranial Nerves: No cranial nerve deficit.     Motor: Weakness present.     Deep Tendon Reflexes: Reflexes are normal and symmetric.     Comments: Using cane  Psychiatric:        Behavior: Behavior normal.        Thought Content: Thought content normal.        Judgment: Judgment normal.       BP 132/60   Pulse 70   Temp 97.9 F (36.6 C) (Temporal)   Ht 5' (1.524 m)   Wt 167 lb 12.8 oz (76.1 kg)   SpO2 (!) 70%   BMI 32.77 kg/m      Assessment & Plan:  Gabriella Ferguson comes in today with chief complaint of Diabetes, Hypertension, and Dysuria   Diagnosis and orders addressed:  1. Type 2 diabetes mellitus with hyperglycemia, without long-term current use of insulin (HCC) - Bayer DCA Hb A1c Waived - CMP14+EGFR - CBC with Differential/Platelet  2. Dysuria - Urinalysis, Complete - Urine Culture - CMP14+EGFR - CBC with Differential/Platelet  3. Essential hypertension - CMP14+EGFR - CBC with Differential/Platelet  4. Gastroesophageal reflux disease, unspecified whether esophagitis present - CMP14+EGFR - CBC with Differential/Platelet  5. Hyperlipidemia associated with type 2 diabetes mellitus (HCC) - CMP14+EGFR - CBC with Differential/Platelet  6. Constipation, unspecified constipation type - CMP14+EGFR - CBC with Differential/Platelet  7. Osteoporosis with current pathological fracture with routine healing, unspecified osteoporosis type, subsequent encounter - CMP14+EGFR - CBC with Differential/Platelet  8. Recurrent UTI -Start Keflex 500 mg BID for 7 days then start Keflex 250 mg daily to  prevent UTI Force fluids RTO if symptoms worsen or do not improve  Culture pending - cephALEXin (KEFLEX) 500 MG capsule; Take 1 capsule (500 mg total) by mouth 2 (two) times daily.  Dispense: 14 capsule; Refill: 0 - CMP14+EGFR - CBC with Differential/Platelet - cephALEXin (KEFLEX) 250 MG capsule; Take 1 capsule (250 mg total) by mouth daily.  Dispense: 90 capsule; Refill: 1   Labs pending Health Maintenance reviewed Diet and exercise encouraged  Follow up plan: 3 months   [name], FNP

## 2020-07-11 ENCOUNTER — Telehealth: Payer: Self-pay | Admitting: Pharmacist

## 2020-07-11 ENCOUNTER — Ambulatory Visit (INDEPENDENT_AMBULATORY_CARE_PROVIDER_SITE_OTHER): Payer: Medicare Other | Admitting: Pharmacist

## 2020-07-11 ENCOUNTER — Other Ambulatory Visit: Payer: Self-pay

## 2020-07-11 ENCOUNTER — Other Ambulatory Visit: Payer: Self-pay | Admitting: Family

## 2020-07-11 DIAGNOSIS — M810 Age-related osteoporosis without current pathological fracture: Secondary | ICD-10-CM

## 2020-07-11 DIAGNOSIS — E119 Type 2 diabetes mellitus without complications: Secondary | ICD-10-CM | POA: Diagnosis not present

## 2020-07-11 NOTE — Progress Notes (Signed)
    07/11/2020 Name: Gabriella Ferguson MRN: [id number] DOB: 1929-02-15   S:  28 yoF Presents for diabetes evaluation, education, and management Patient was referred and last seen by Primary Care Provider on 07/07/20.  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 Daughter fills weekly pill box   Diabetes:T2DM;most recent A1c13.2%on 07/07/20  Most recent eGFR:42  Current antihyperglycemic regimen:metformin, januvia, glipizide  Denies hypoglycemic symptoms  Reports hyperglycemic symptoms, including polyuria, nocturia  Current meal patterns:discussed meals, patient enjoys sweets, etc ? Encouraged adequate water intake ? Limiting sweets ? Eats mostly frozen meals or out to eat  Current exercise:n/a, patient ambulates with cane, previous hip fracture  Current blood glucose readings:does not check  Cardiovascular risk reduction: ? Current hypertensive regimen:hctz ? Current hyperlipidemia regimen:Simvastatin 37m-LDL 53 on 08/2019 ? Current antiplatelet regimen:n/a   Discussed meal planning options and Plate method for healthy eating . Avoid sugary drinks and desserts . Incorporate balanced protein, non starchy veggies, 1 serving of carbohydrate with each meal . Increase water intake . Increase physical activity as able  O:  Lab Results  Component Value Date   HGBA1C 13.2 (H) 07/07/2020   Lipid Panel     Component Value Date/Time   CHOL 132 09/21/2019 1214   TRIG 192 (H) 09/21/2019 1214   HDL 48 09/21/2019 1214   CHOLHDL 2.8 09/21/2019 1214   LDLCALC 53 09/21/2019 1214    Home fasting blood sugars: not able to check; daughter lives too far away for patient to check; she does not qualify for a CGM per insurance requirements   2 hour post-meal/random blood sugars: n/a.  A/P: Diabetes longstandingT2DMcurrently uncontrolled. Patient is able to verbalize appropriate hypoglycemia  management plan. Patientisadherent with medication. Control is suboptimal due to dietary/lifestyle.  -Continue Januvia and metformin as prescribed  -RESTARTED Rybelsus 339mdaily. BG was >350 in office and patient refuses insulin and injectables.  Samples given  -Continue glipizide 2.30m27mL. This medication is not ideal, however PO DM medicaition options are few given intoleranes, h/o yeast infections, doesn't prefer injections.  -Extensively discussed pathophysiology of diabetes, recommended lifestyle interventions, dietary effects on blood sugar control  -Counseled on s/sx of and management of hypoglycemia  -Next A1C anticipated3 months  Follow up PharmD Clinic Visit on 08/10/20.   JulRegina EckharmD, BCPS Clinical Pharmacist, [facility] Phone [phone]

## 2020-07-11 NOTE — Telephone Encounter (Signed)
Saw patient for DM visit.  She states her UTI has not improved.  Today is day 4/7 of keflex.  Encouraged patient to continue medication as prescribed. Encouraged patient to call if symptoms not better by Thursday.  Pt afebrile   Will route to PCP just to make aware

## 2020-07-12 ENCOUNTER — Other Ambulatory Visit: Payer: Self-pay | Admitting: Family

## 2020-07-12 ENCOUNTER — Other Ambulatory Visit: Payer: Medicare Other

## 2020-07-12 DIAGNOSIS — R300 Dysuria: Secondary | ICD-10-CM | POA: Diagnosis not present

## 2020-07-12 LAB — URINALYSIS, COMPLETE
Bilirubin, UA: NEGATIVE
Ketones, UA: NEGATIVE
Nitrite, UA: NEGATIVE
Specific Gravity, UA: 1.02 (ref 1.005–1.030)
Urobilinogen, Ur: 0.2 mg/dL (ref 0.2–1.0)
pH, UA: 5 (ref 5.0–7.5)

## 2020-07-12 LAB — MICROSCOPIC EXAMINATION

## 2020-07-12 MED ORDER — DOXYCYCLINE HYCLATE 100 MG PO TABS: 100.0000 mg | ORAL_TABLET | Freq: Two times a day (BID) | ORAL | 0 refills | Status: DC

## 2020-07-12 NOTE — Telephone Encounter (Signed)
Pt aware to come in the office to drop off urine sample.

## 2020-07-12 NOTE — Telephone Encounter (Signed)
Please have patient bring urine sample in for culture.

## 2020-07-13 LAB — URINE CULTURE

## 2020-07-18 ENCOUNTER — Ambulatory Visit: Payer: Medicare Other | Admitting: Pharmacist

## 2020-07-23 IMAGING — DX DG ABDOMEN 1V
2 series · 2 of 2 positions shown · non-contrast
Comparison: None

CLINICAL DATA: RIGHT-side abdominal pain, RIGHT lower quadrant pain

EXAM:
ABDOMEN - 1 VIEW

[abdomen kub (1 of 2)]
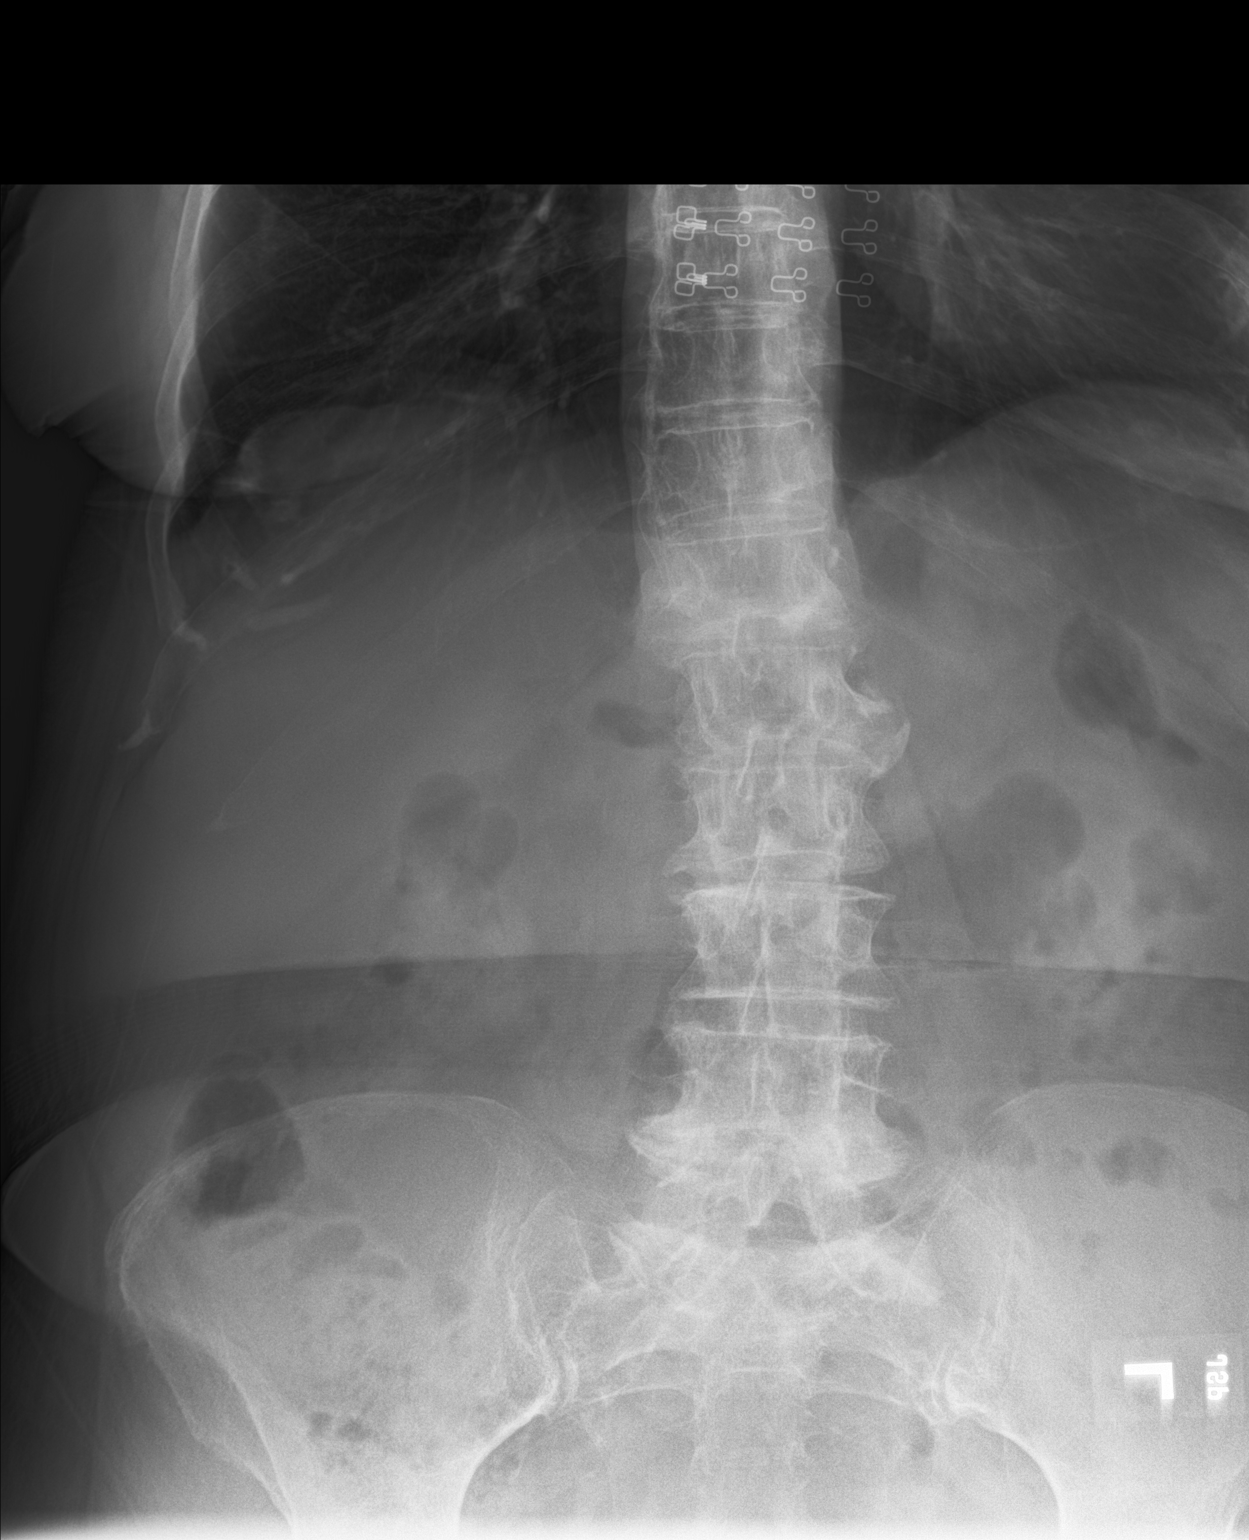

[abdomen kub (2 of 2)]
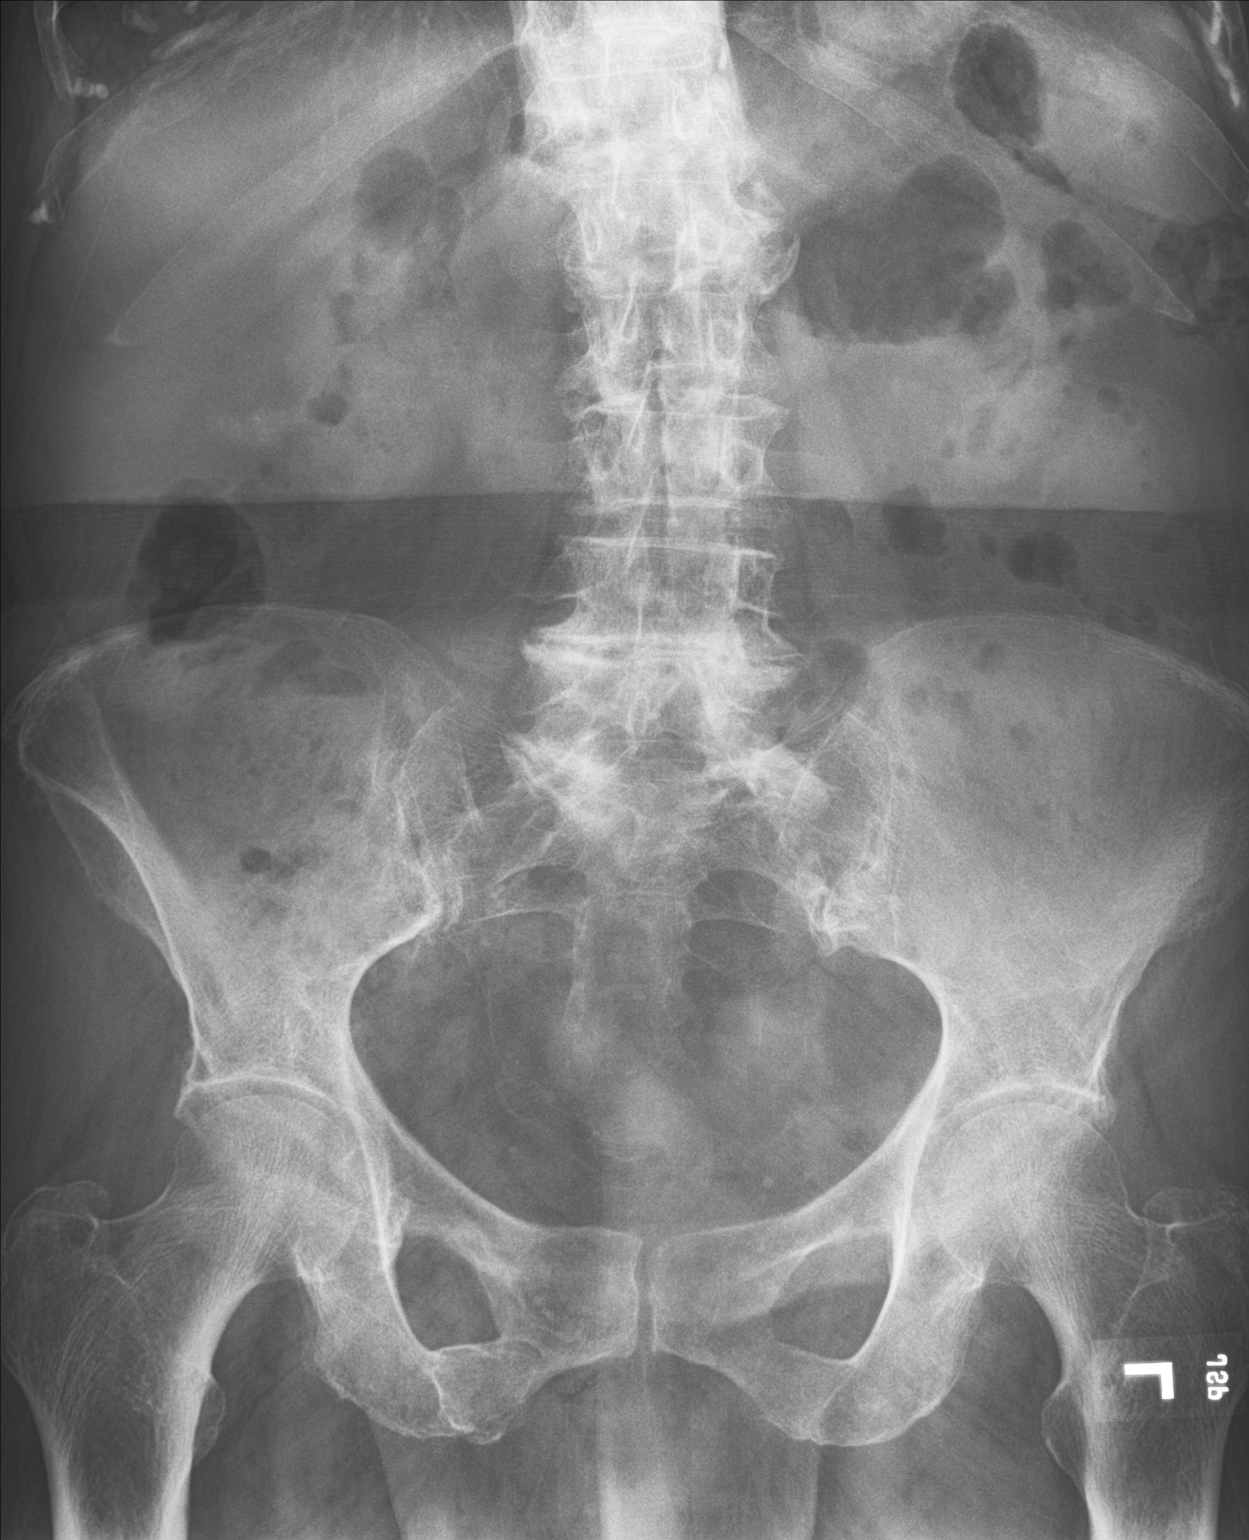

[2 of 2 positions shown; findings below may reference images not displayed]

FINDINGS: Lung bases clear.

Nonobstructive bowel gas pattern.

No bowel dilatation, bowel wall thickening or free air.

Osseous demineralization with degenerative disc disease changes of
the lumbar spine.

Atherosclerotic calcifications of aorta and iliac arteries.

No definite urinary tract calcifications.
IMPRESSION: No acute abnormalities.

## 2020-07-28 ENCOUNTER — Other Ambulatory Visit: Payer: Self-pay | Admitting: Family

## 2020-07-28 DIAGNOSIS — G629 Polyneuropathy, unspecified: Secondary | ICD-10-CM

## 2020-08-09 ENCOUNTER — Ambulatory Visit (INDEPENDENT_AMBULATORY_CARE_PROVIDER_SITE_OTHER): Payer: Medicare Other | Admitting: Pharmacist

## 2020-08-09 ENCOUNTER — Other Ambulatory Visit: Payer: Self-pay

## 2020-08-09 DIAGNOSIS — E119 Type 2 diabetes mellitus without complications: Secondary | ICD-10-CM | POA: Diagnosis not present

## 2020-08-09 MED ORDER — RYBELSUS 3 MG PO TABS: 3.0000 mg | ORAL_TABLET | Freq: Every morning | ORAL | 0 refills | Status: DC

## 2020-08-09 NOTE — Progress Notes (Signed)
ADDENDUM TO BELOW  -Continue metformin as prescribed  -RESTARTED Rybelsus 3mg  daily. BG was >350 in office and patient refuses insulin and injectables.  Samples given  -STOP JANUVIA  -Continue glipizide 2.5mg  XL. This medication is not ideal, however PO DM medicaition options are few given intoleranes, h/o yeast infections, doesn't prefer injections.  -MEDICATION LIST UPDATED TO REFLECT CHANGES

## 2020-08-09 NOTE — Progress Notes (Signed)
    08/09/2020 Name: Gabriella Ferguson MRN: [id number] DOB: 07-Jul-1929   S:  64 yoF Presents for diabetes evaluation, education, and management Patient was referred and last seen by Primary Care Provider on 07/07/20.  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 Daughter fills weekly pill box.  Rybelsus is kept separate.   Diabetes:T2DM;most recent A1c13.2%on 07/07/20  Most recent eGFR:42  Current antihyperglycemic regimen:metformin, rybelsus, glipizide  Denies hypoglycemic symptoms  Reports hyperglycemic symptoms, including polyuria, nocturia  Current meal patterns:discussed meals, patient enjoys sweets, etc ? Encouraged adequate water intake ? Limiting sweets ? Eats mostly frozen meals or out to eat  Current exercise:n/a, patient ambulates with cane, previous hip fracture  Current blood glucose readings:does not check  Cardiovascular risk reduction: ? Current hypertensive regimen:hctz ? Current hyperlipidemia regimen:Simvastatin 73m-LDL 53 on 08/2019 ? Current antiplatelet regimen:n/a   Discussed meal planning options and Plate method for healthy eating  Avoid sugary drinks and desserts  Incorporate balanced protein, non starchy veggies, 1 serving of carbohydrate with each meal  Increase water intake  Increase physical activity as able--patient unable to exercise   O:  Lab Results  Component Value Date   HGBA1C 13.2 (H) 07/07/2020    There were no vitals filed for this visit.     Lipid Panel     Component Value Date/Time   CHOL 132 09/21/2019 1214   TRIG 192 (H) 09/21/2019 1214   HDL 48 09/21/2019 1214   CHOLHDL 2.8 09/21/2019 1214   LDLCALC 53 09/21/2019 1214    Home fasting blood sugars: PATIENT UNABLE TO CHECK; FBG 233 THIS AM IN OFFICE  2 hour post-meal/random blood sugars: N/A.   A/P:  Diabetes T2DM currently UNCONTROLLED.  Patient IS MOSTLY  adherent with  medication.  I'm unsure of her compliance with Rybelsus as she normally uses a pill box for other medications.  Rybelsus is to be left in original packaging and taken by itself in the mornings (3o min before eating, drinking & other medications) Control is suboptimal due to inability to manage T2DM on her own/diet  -Continue metformin as prescribed  -CONTINUE Rybelsus 336mdaily (PATIENT HAS BEEN OUT OF THIS MEDICATION FOR 2 WEEKS). BG was >350 in office and patient refuses insulin and injectables. Samples given.  Denies history of thyroid cancer.  -STOP JANUVIA  -Continue glipizide 2.43m72mL. This medication is not ideal, however PO DM medicaition options are few given intoleranes, h/o yeast infections, doesn't prefer injections.  -MEDICATION LIST UPDATED TO REFLECT CHANGES  -Extensively discussed pathophysiology of diabetes, recommended lifestyle interventions, dietary effects on blood sugar control  -Counseled on s/sx of and management of hypoglycemia  -Next A1C anticipated 10/10/20  Written patient instructions provided.  Total time in face to face counseling 20 minutes.   Follow up PCP Clinic Visit ON 08/12/20   JulRegina EckharmD, BCPS Clinical Pharmacist, [facility] Phone [phone]

## 2020-08-09 NOTE — Addendum Note (Signed)
Addended by: Lottie Dawson D on: 08/09/2020 10:35 AM   Modules accepted: Orders

## 2020-08-12 ENCOUNTER — Other Ambulatory Visit: Payer: Self-pay

## 2020-08-12 ENCOUNTER — Ambulatory Visit (INDEPENDENT_AMBULATORY_CARE_PROVIDER_SITE_OTHER): Payer: Medicare Other | Admitting: Family

## 2020-08-12 ENCOUNTER — Encounter: Payer: Self-pay | Admitting: Family

## 2020-08-12 VITALS — BP 157/66 | HR 70 | Temp 97.2°F | Ht 60.0 in | Wt 163.0 lb

## 2020-08-12 DIAGNOSIS — B372 Candidiasis of skin and nail: Secondary | ICD-10-CM | POA: Diagnosis not present

## 2020-08-12 DIAGNOSIS — N898 Other specified noninflammatory disorders of vagina: Secondary | ICD-10-CM

## 2020-08-12 DIAGNOSIS — R300 Dysuria: Secondary | ICD-10-CM | POA: Diagnosis not present

## 2020-08-12 DIAGNOSIS — B373 Candidiasis of vulva and vagina: Secondary | ICD-10-CM

## 2020-08-12 DIAGNOSIS — B3731 Acute candidiasis of vulva and vagina: Secondary | ICD-10-CM

## 2020-08-12 LAB — MICROSCOPIC EXAMINATION: RBC, Urine: NONE SEEN /hpf (ref 0–2)

## 2020-08-12 LAB — URINALYSIS, COMPLETE
Bilirubin, UA: NEGATIVE
Nitrite, UA: NEGATIVE
RBC, UA: NEGATIVE
Specific Gravity, UA: 1.025 (ref 1.005–1.030)
Urobilinogen, Ur: 0.2 mg/dL (ref 0.2–1.0)
pH, UA: 5 (ref 5.0–7.5)

## 2020-08-12 LAB — WET PREP FOR TRICH, YEAST, CLUE
Clue Cell Exam: NEGATIVE
Trichomonas Exam: NEGATIVE
Yeast Exam: NEGATIVE

## 2020-08-12 MED ORDER — KETOCONAZOLE 2 % EX CREA: 1.0000 "application " | TOPICAL_CREAM | Freq: Every day | CUTANEOUS | 0 refills | Status: DC

## 2020-08-12 MED ORDER — FLUCONAZOLE 150 MG PO TABS: 150.0000 mg | ORAL_TABLET | Freq: Every day | ORAL | 0 refills | Status: DC

## 2020-08-12 MED ORDER — NYSTATIN 100000 UNIT/GM EX POWD
1.0000 | Freq: Three times a day (TID) | CUTANEOUS | 0 refills | Status: DC
Start: 2020-08-12 — End: 2022-02-13

## 2020-08-12 MED ORDER — FLUCONAZOLE 150 MG PO TABS: 150.0000 mg | ORAL_TABLET | ORAL | 0 refills | Status: DC | PRN

## 2020-08-12 NOTE — Patient Instructions (Signed)

## 2020-08-12 NOTE — Progress Notes (Signed)
Subjective:    Patient ID: Gabriella Ferguson, female    DOB: July 30, 1929, 86 y.o.   MRN: [id number]  Chief Complaint  Patient presents with   Dysuria    Dysuria  This is a recurrent problem. The current episode started 1 to 4 weeks ago. The problem occurs intermittently. The problem has been waxing and waning. The quality of the pain is described as burning. The pain is at a severity of 9/10. The pain is mild. Associated symptoms include frequency. Pertinent negatives include no hematuria, hesitancy, nausea, sweats or urgency. She has tried increased fluids and antibiotics for the symptoms. The treatment provided mild relief.      Review of Systems  Gastrointestinal: Negative for nausea.  Genitourinary: Positive for dysuria and frequency. Negative for hematuria, hesitancy and urgency.  All other systems reviewed and are negative.      Objective:   Physical Exam Vitals reviewed.  Constitutional:      General: She is not in acute distress.    Appearance: She is well-developed and well-nourished.  HENT:     Head: Normocephalic and atraumatic.     Mouth/Throat:     Mouth: Oropharynx is clear and moist.  Eyes:     Pupils: Pupils are equal, round, and reactive to light.  Neck:     Thyroid: No thyromegaly.  Cardiovascular:     Rate and Rhythm: Normal rate and regular rhythm.     Pulses: Intact distal pulses.     Heart sounds: Normal heart sounds. No murmur heard.   Pulmonary:     Effort: Pulmonary effort is normal. No respiratory distress.     Breath sounds: Normal breath sounds. No wheezing.  Abdominal:     General: Bowel sounds are normal. There is no distension.     Palpations: Abdomen is soft.     Tenderness: There is no abdominal tenderness.  Genitourinary:   Musculoskeletal:        General: No tenderness or edema. Normal range of motion.     Cervical back: Normal range of motion and neck supple.  Skin:    General: Skin is warm and dry.          Comments:  Skin erythemas in groin and folds  Neurological:     Mental Status: She is alert and oriented to person, place, and time.     Cranial Nerves: No cranial nerve deficit.     Deep Tendon Reflexes: Reflexes are normal and symmetric.  Psychiatric:        Mood and Affect: Mood and affect normal.        Behavior: Behavior normal.        Thought Content: Thought content normal.        Judgment: Judgment normal.     BP (!) 157/66    Pulse 70    Temp (!) 97.2 F (36.2 C) (Temporal)    Ht 5' (1.524 m)    Wt 163 lb (73.9 kg)    BMI 31.83 kg/m      Assessment & Plan:  Gabriella Ferguson comes in today with chief complaint of Dysuria   Diagnosis and orders addressed:  1. Dysuria - Urinalysis, Complete - Urine Culture - WET PREP FOR TRICH, YEAST, CLUE  2. Vaginal irritation - WET PREP FOR TRICH, YEAST, CLUE  3. Vagina, candidiasis Keep Clean and dry No scratch  Nystatin powder Ketoconazole cream Start diflucan daily for 7 days - nystatin (MYCOSTATIN/NYSTOP) powder; Apply 1 application topically 3 (three)  times daily.  Dispense: 15 g; Refill: 0 - ketoconazole (NIZORAL) 2 % cream; Apply 1 application topically daily.  Dispense: 15 g; Refill: 0  4. Skin candidiasis - nystatin (MYCOSTATIN/NYSTOP) powder; Apply 1 application topically 3 (three) times daily.  Dispense: 15 g; Refill: 0 - ketoconazole (NIZORAL) 2 % cream; Apply 1 application topically daily.  Dispense: 15 g; Refill: 0   Gabriella Dun, FNP

## 2020-08-14 LAB — URINE CULTURE

## 2020-08-17 ENCOUNTER — Other Ambulatory Visit: Payer: Self-pay | Admitting: Family

## 2020-08-17 DIAGNOSIS — B372 Candidiasis of skin and nail: Secondary | ICD-10-CM

## 2020-08-17 DIAGNOSIS — B3731 Acute candidiasis of vulva and vagina: Secondary | ICD-10-CM

## 2020-08-17 DIAGNOSIS — B373 Candidiasis of vulva and vagina: Secondary | ICD-10-CM

## 2020-09-05 ENCOUNTER — Other Ambulatory Visit: Payer: Self-pay | Admitting: Family

## 2020-09-05 DIAGNOSIS — I10 Essential (primary) hypertension: Secondary | ICD-10-CM

## 2020-10-04 ENCOUNTER — Other Ambulatory Visit: Payer: Self-pay | Admitting: Family

## 2020-10-10 ENCOUNTER — Encounter: Payer: Self-pay | Admitting: Family

## 2020-10-10 ENCOUNTER — Ambulatory Visit (INDEPENDENT_AMBULATORY_CARE_PROVIDER_SITE_OTHER): Payer: Medicare Other | Admitting: Family

## 2020-10-10 ENCOUNTER — Other Ambulatory Visit: Payer: Self-pay

## 2020-10-10 VITALS — BP 130/62 | HR 91 | Temp 97.0°F | Ht 60.0 in | Wt 158.4 lb

## 2020-10-10 DIAGNOSIS — R350 Frequency of micturition: Secondary | ICD-10-CM | POA: Diagnosis not present

## 2020-10-10 DIAGNOSIS — E1169 Type 2 diabetes mellitus with other specified complication: Secondary | ICD-10-CM

## 2020-10-10 DIAGNOSIS — K219 Gastro-esophageal reflux disease without esophagitis: Secondary | ICD-10-CM | POA: Diagnosis not present

## 2020-10-10 DIAGNOSIS — K5900 Constipation, unspecified: Secondary | ICD-10-CM

## 2020-10-10 DIAGNOSIS — E785 Hyperlipidemia, unspecified: Secondary | ICD-10-CM | POA: Diagnosis not present

## 2020-10-10 DIAGNOSIS — E1165 Type 2 diabetes mellitus with hyperglycemia: Secondary | ICD-10-CM

## 2020-10-10 DIAGNOSIS — I10 Essential (primary) hypertension: Secondary | ICD-10-CM

## 2020-10-10 LAB — URINALYSIS, COMPLETE
Bilirubin, UA: NEGATIVE
Nitrite, UA: POSITIVE — AB
Specific Gravity, UA: 1.02 (ref 1.005–1.030)
Urobilinogen, Ur: 0.2 mg/dL (ref 0.2–1.0)
pH, UA: 5 (ref 5.0–7.5)

## 2020-10-10 LAB — BAYER DCA HB A1C WAIVED: HB A1C (BAYER DCA - WAIVED): >14 % — ABNORMAL HIGH (ref <7.0)

## 2020-10-10 LAB — MICROSCOPIC EXAMINATION: RBC, Urine: NONE SEEN /hpf (ref 0–2)

## 2020-10-10 MED ORDER — MIRABEGRON ER 25 MG PO TB24
25.0000 mg | ORAL_TABLET | Freq: Every day | ORAL | 1 refills | Status: DC
Start: 2020-10-10 — End: 2021-04-10

## 2020-10-10 NOTE — Patient Instructions (Signed)
Overactive Bladder, Adult  Overactive bladder is a condition in which a person has a sudden and frequent need to urinate. A person might also leak urine if he or she cannot get to the bathroom fast enough (urinary incontinence). Sometimes, symptoms can interfere with work or social activities. What are the causes? Overactive bladder is associated with poor nerve signals between your bladder and your brain. Your bladder may get the signal to empty before it is full. You may also have very sensitive muscles that make your bladder squeeze too soon. This condition may also be caused by other factors, such as:  Medical conditions: ? Urinary tract infection. ? Infection of nearby tissues. ? Prostate enlargement. ? Bladder stones, inflammation, or tumors. ? Diabetes. ? Muscle or nerve weakness, especially from these conditions:  A spinal cord injury.  Stroke.  Multiple sclerosis.  Parkinson's disease.  Other causes: ? Surgery on the uterus or urethra. ? Drinking too much caffeine or alcohol. ? Certain medicines, especially those that eliminate extra fluid in the body (diuretics). ? Constipation. What increases the risk? You may be at greater risk for overactive bladder if you:  Are an older adult.  Smoke.  Are going through menopause.  Have prostate problems.  Have a neurological disease, such as stroke, dementia, Parkinson's disease, or multiple sclerosis (MS).  Eat or drink alcohol, spicy food, caffeine, and other things that irritate the bladder.  Are overweight or obese. What are the signs or symptoms? Symptoms of this condition include a sudden, strong urge to urinate. Other symptoms include:  Leaking urine.  Urinating 8 or more times a day.  Waking up to urinate 2 or more times overnight. How is this diagnosed? This condition may be diagnosed based on:  Your symptoms and medical history.  A physical exam.  Blood or urine tests to check for possible causes,  such as infection. You may also need to see a health care provider who specializes in urinary tract problems. This is called a urologist. How is this treated? Treatment for overactive bladder depends on the cause of your condition and whether it is mild or severe. Treatment may include:  Bladder training, such as: ? Learning to control the urge to urinate by following a schedule to urinate at regular intervals. ? Doing Kegel exercises to strengthen the pelvic floor muscles that support your bladder.  Special devices, such as: ? Biofeedback. This uses sensors to help you become aware of your body's signals. ? Electrical stimulation. This uses electrodes placed inside the body (implanted) or outside the body. These electrodes send gentle pulses of electricity to strengthen the nerves or muscles that control the bladder. ? Women may use a plastic device, called a pessary, that fits into the vagina and supports the bladder.  Medicines, such as: ? Antibiotics to treat bladder infection. ? Antispasmodics to stop the bladder from releasing urine at the wrong time. ? Tricyclic antidepressants to relax bladder muscles. ? Injections of botulinum toxin type A directly into the bladder tissue to relax bladder muscles.  Surgery, such as: ? A device may be implanted to help manage the nerve signals that control urination. ? An electrode may be implanted to stimulate electrical signals in the bladder. ? A procedure may be done to change the shape of the bladder. This is done only in very severe cases. Follow these instructions at home: Eating and drinking  Make diet or lifestyle changes recommended by your health care provider. These may include: ? Drinking fluids   throughout the day and not only with meals. ? Cutting down on caffeine or alcohol. ? Eating a healthy and balanced diet to prevent constipation. This may include:  Choosing foods that are high in fiber, such as beans, whole grains, and  fresh fruits and vegetables.  Limiting foods that are high in fat and processed sugars, such as fried and sweet foods.   Lifestyle  Lose weight if needed.  Do not use any products that contain nicotine or tobacco. These include cigarettes, chewing tobacco, and vaping devices, such as e-cigarettes. If you need help quitting, ask your health care provider.   General instructions  Take over-the-counter and prescription medicines only as told by your health care provider.  If you were prescribed an antibiotic medicine, take it as told by your health care provider. Do not stop taking the antibiotic even if you start to feel better.  Use any implants or pessary as told by your health care provider.  If needed, wear pads to absorb urine leakage.  Keep a log to track how much and when you drink, and when you need to urinate. This will help your health care provider monitor your condition.  Keep all follow-up visits. This is important. Contact a health care provider if:  You have a fever or chills.  Your symptoms do not get better with treatment.  Your pain and discomfort get worse.  You have more frequent urges to urinate. Get help right away if:  You are not able to control your bladder. Summary  Overactive bladder refers to a condition in which a person has a sudden and frequent need to urinate.  Several conditions may lead to an overactive bladder.  Treatment for overactive bladder depends on the cause and severity of your condition.  Making lifestyle changes, doing Kegel exercises, keeping a log, and taking medicines can help with this condition. This information is not intended to replace advice given to you by your health care provider. Make sure you discuss any questions you have with your health care provider. Document Revised: 05/02/2020 Document Reviewed: 05/02/2020 Elsevier Patient Education  2021 [employer]  

## 2020-10-10 NOTE — Progress Notes (Signed)
Subjective:    Patient ID: Gabriella Ferguson, female    DOB: [birth year], 87 y.o.   MRN: [id number]  Chief Complaint  Patient presents with  . Diabetes  . Hypertension   PT presents to the office today for chronic follow up. She has uncontrolled DM with her A1C of 13.2%. She is unable to cook and eats a lot of frozen foods. She has is followed by our Clinical Pharm for diabetic education. She refuses any injectable. She has been taking Rybelsus and has lost 5 lbs since 08/12/20.   She also complaining of urinary frequency.  Diabetes She presents for her follow-up diabetic visit. She has type 2 diabetes mellitus. Her disease course has been stable. There are no hypoglycemic associated symptoms. Associated symptoms include foot paresthesias. Pertinent negatives for diabetes include no blurred vision. Symptoms are stable. Diabetic complications include nephropathy and peripheral neuropathy. Pertinent negatives for diabetic complications include no CVA or heart disease. Risk factors for coronary artery disease include dyslipidemia, diabetes mellitus, hypertension, sedentary lifestyle and post-menopausal. She is following a generally unhealthy diet. (Does not check BS at home ) An ACE inhibitor/angiotensin II receptor blocker is not being taken. Eye exam is not current.  Hypertension This is a chronic problem. The current episode started more than 1 year ago. The problem has been resolved since onset. The problem is controlled. Associated symptoms include malaise/fatigue. Pertinent negatives include no blurred vision, peripheral edema or shortness of breath. Risk factors for coronary artery disease include dyslipidemia, diabetes mellitus, obesity and sedentary lifestyle. The current treatment provides moderate improvement. There is no history of CVA.      Review of Systems  Constitutional: Positive for malaise/fatigue.  Eyes: Negative for blurred vision.  Respiratory: Negative for shortness of  breath.   All other systems reviewed and are negative.      Objective:   Physical Exam Vitals reviewed.  Constitutional:      General: She is not in acute distress.    Appearance: She is well-developed and well-nourished.  HENT:     Head: Normocephalic and atraumatic.     Right Ear: Tympanic membrane normal.     Left Ear: Tympanic membrane normal.     Mouth/Throat:     Mouth: Oropharynx is clear and moist.  Eyes:     Pupils: Pupils are equal, round, and reactive to light.  Neck:     Thyroid: No thyromegaly.  Cardiovascular:     Rate and Rhythm: Normal rate and regular rhythm.     Pulses: Intact distal pulses.     Heart sounds: Murmur heard.    Pulmonary:     Effort: Pulmonary effort is normal. No respiratory distress.     Breath sounds: Decreased breath sounds present. No wheezing.  Abdominal:     General: Bowel sounds are normal. There is no distension.     Palpations: Abdomen is soft.     Tenderness: There is no abdominal tenderness.  Musculoskeletal:        General: No tenderness or edema. Normal range of motion.     Cervical back: Normal range of motion and neck supple.  Skin:    General: Skin is warm and dry.  Neurological:     Mental Status: She is alert and oriented to person, place, and time.     Cranial Nerves: No cranial nerve deficit.     Motor: Weakness present.     Gait: Gait abnormal (using cane to walk).     Deep Tendon  Reflexes: Reflexes are normal and symmetric.  Psychiatric:        Mood and Affect: Mood and affect normal.        Behavior: Behavior normal.        Thought Content: Thought content normal.        Judgment: Judgment normal.       BP 130/62   Pulse 91   Temp (!) 97 F (36.1 C) (Temporal)   Ht 5' (1.524 m)   Wt 158 lb 6.4 oz (71.8 kg)   BMI 30.94 kg/m      Assessment & Plan:  Simrah Chatham Frenz comes in today with chief complaint of Diabetes and Hypertension   Diagnosis and orders addressed:  1. Essential  hypertension - CMP14+EGFR - CBC with Differential/Platelet  2. Type 2 diabetes mellitus with hyperglycemia, without long-term current use of insulin (HCC) Strict low carb diet Continue Rybelsus 3 mg, may increase after lab work - Bayer DCA Hb A1c Waived - CMP14+EGFR - CBC with Differential/Platelet  3. Hyperlipidemia associated with type 2 diabetes mellitus (HCC) - CMP14+EGFR - CBC with Differential/Platelet  4. Gastroesophageal reflux disease, unspecified whether esophagitis present - CMP14+EGFR - CBC with Differential/Platelet  5. Constipation, unspecified constipation type - CMP14+EGFR - CBC with Differential/Platelet  6. Urinary frequency Will add myrbetriq 25 mg today - Urinalysis, Complete - Urine Culture - mirabegron ER (MYRBETRIQ) 25 MG TB24 tablet; Take 1 tablet (25 mg total) by mouth daily.  Dispense: 90 tablet; Refill: 1   Labs pending Health Maintenance reviewed Diet and exercise encouraged  Follow up plan: 1 month recheck DM and Urinary frequency    [name], FNP

## 2020-10-11 ENCOUNTER — Other Ambulatory Visit: Payer: Self-pay | Admitting: Family

## 2020-10-11 LAB — CBC WITH DIFFERENTIAL/PLATELET
Basophils Absolute: 0 10*3/uL (ref 0.0–0.2)
Basos: 0 %
EOS (ABSOLUTE): 0.1 10*3/uL (ref 0.0–0.4)
Eos: 1 %
Hematocrit: 42.5 % (ref 34.0–46.6)
Hemoglobin: 13.5 g/dL (ref 11.1–15.9)
Immature Grans (Abs): 0 10*3/uL (ref 0.0–0.1)
Immature Granulocytes: 0 %
Lymphocytes Absolute: 2.1 10*3/uL (ref 0.7–3.1)
Lymphs: 30 %
MCH: 28.2 pg (ref 26.6–33.0)
MCHC: 31.8 g/dL (ref 31.5–35.7)
MCV: 89 fL (ref 79–97)
Monocytes Absolute: 0.5 10*3/uL (ref 0.1–0.9)
Monocytes: 7 %
Neutrophils Absolute: 4.3 10*3/uL (ref 1.4–7.0)
Neutrophils: 62 %
Platelets: 234 10*3/uL (ref 150–450)
RBC: 4.78 x10E6/uL (ref 3.77–5.28)
RDW: 12.8 % (ref 11.7–15.4)
WBC: 7 10*3/uL (ref 3.4–10.8)

## 2020-10-11 LAB — CMP14+EGFR
ALT: 12 IU/L (ref 0–32)
AST: 14 IU/L (ref 0–40)
Albumin/Globulin Ratio: 1.5 (ref 1.2–2.2)
Albumin: 4.1 g/dL (ref 3.6–4.6)
Alkaline Phosphatase: 76 IU/L (ref 44–121)
BUN/Creatinine Ratio: 11 — ABNORMAL LOW (ref 12–28)
BUN: 15 mg/dL (ref 8–27)
Bilirubin Total: 0.3 mg/dL (ref 0.0–1.2)
CO2: 27 mmol/L (ref 20–29)
Calcium: 9.7 mg/dL (ref 8.7–10.3)
Chloride: 94 mmol/L — ABNORMAL LOW (ref 96–106)
Creatinine, Ser: 1.31 mg/dL — ABNORMAL HIGH (ref 0.57–1.00)
GFR calc Af Amer: 42 mL/min/{1.73_m2} — ABNORMAL LOW (ref >59)
GFR calc non Af Amer: 36 mL/min/{1.73_m2} — ABNORMAL LOW (ref >59)
Globulin, Total: 2.7 g/dL (ref 1.5–4.5)
Glucose: 392 mg/dL — ABNORMAL HIGH (ref 65–99)
Potassium: 4.6 mmol/L (ref 3.5–5.2)
Sodium: 136 mmol/L (ref 134–144)
Total Protein: 6.8 g/dL (ref 6.0–8.5)

## 2020-10-11 MED ORDER — AMOXICILLIN-POT CLAVULANATE 875-125 MG PO TABS: 1.0000 | ORAL_TABLET | Freq: Two times a day (BID) | ORAL | 0 refills | Status: DC

## 2020-10-11 MED ORDER — RYBELSUS 7 MG PO TABS: 7.0000 mg | ORAL_TABLET | Freq: Every day | ORAL | 1 refills | Status: DC

## 2020-10-13 ENCOUNTER — Other Ambulatory Visit: Payer: Self-pay | Admitting: Family

## 2020-10-13 LAB — URINE CULTURE

## 2020-10-13 MED ORDER — CIPROFLOXACIN HCL 500 MG PO TABS: 500.0000 mg | ORAL_TABLET | Freq: Every day | ORAL | 0 refills | Status: DC

## 2020-10-17 ENCOUNTER — Other Ambulatory Visit: Payer: Self-pay | Admitting: Family

## 2020-10-17 DIAGNOSIS — M810 Age-related osteoporosis without current pathological fracture: Secondary | ICD-10-CM

## 2020-10-18 ENCOUNTER — Other Ambulatory Visit: Payer: Self-pay | Admitting: Family

## 2020-11-02 ENCOUNTER — Other Ambulatory Visit: Payer: Self-pay | Admitting: Family

## 2020-11-02 DIAGNOSIS — G629 Polyneuropathy, unspecified: Secondary | ICD-10-CM

## 2020-11-08 ENCOUNTER — Other Ambulatory Visit: Payer: Self-pay

## 2020-11-08 ENCOUNTER — Ambulatory Visit (INDEPENDENT_AMBULATORY_CARE_PROVIDER_SITE_OTHER): Payer: Medicare Other | Admitting: Family

## 2020-11-08 ENCOUNTER — Encounter: Payer: Self-pay | Admitting: Family

## 2020-11-08 VITALS — BP 123/71 | HR 89 | Temp 98.0°F | Ht 60.0 in | Wt 158.6 lb

## 2020-11-08 DIAGNOSIS — K5900 Constipation, unspecified: Secondary | ICD-10-CM

## 2020-11-08 DIAGNOSIS — E785 Hyperlipidemia, unspecified: Secondary | ICD-10-CM

## 2020-11-08 DIAGNOSIS — E1165 Type 2 diabetes mellitus with hyperglycemia: Secondary | ICD-10-CM | POA: Diagnosis not present

## 2020-11-08 DIAGNOSIS — E1169 Type 2 diabetes mellitus with other specified complication: Secondary | ICD-10-CM

## 2020-11-08 DIAGNOSIS — K219 Gastro-esophageal reflux disease without esophagitis: Secondary | ICD-10-CM | POA: Diagnosis not present

## 2020-11-08 DIAGNOSIS — I10 Essential (primary) hypertension: Secondary | ICD-10-CM | POA: Diagnosis not present

## 2020-11-08 NOTE — Progress Notes (Signed)
Subjective:    Patient ID: Gabriella Ferguson, female    DOB: 05-10-1929, 87 y.o.   MRN: [id number]  Chief Complaint  Patient presents with  . Follow-up   PT presents to the office today for chronic follow up. She has uncontrolled DM with her A1C of >14%. She is unable to cook and eats a lot of frozen foods. She has is followed by our Clinical Pharm for diabetic education. She refuses any injectable. She has been taking Rybelsus.  Shealso complaining of urinary frequency.  Diabetes She presents for her follow-up diabetic visit. She has type 2 diabetes mellitus. Her disease course has been stable. There are no hypoglycemic associated symptoms. Pertinent negatives for diabetes include no blurred vision and no foot paresthesias. Symptoms are stable. Pertinent negatives for diabetic complications include no CVA, heart disease or peripheral neuropathy. Risk factors for coronary artery disease include dyslipidemia, diabetes mellitus, hypertension and sedentary lifestyle. She is following a generally unhealthy diet. (Does not check BS at home ) An ACE inhibitor/angiotensin II receptor blocker is being taken. Eye exam is not current.  Hypertension This is a chronic problem. The current episode started more than 1 year ago. The problem has been resolved since onset. The problem is controlled. Associated symptoms include malaise/fatigue. Pertinent negatives include no blurred vision, peripheral edema or shortness of breath. Risk factors for coronary artery disease include dyslipidemia, diabetes mellitus, obesity and sedentary lifestyle. There is no history of CVA.  Gastroesophageal Reflux She complains of belching and heartburn. This is a chronic problem. The current episode started more than 1 year ago. The problem occurs occasionally. She has tried an antacid and a diet change for the symptoms. The treatment provided moderate relief.  Hyperlipidemia This is a chronic problem. The current episode  started more than 1 year ago. Exacerbating diseases include obesity. Pertinent negatives include no shortness of breath. Current antihyperlipidemic treatment includes statins. Risk factors for coronary artery disease include diabetes mellitus, dyslipidemia, hypertension and a sedentary lifestyle.  Constipation This is a chronic problem. The current episode started more than 1 year ago. She has tried laxatives for the symptoms. The treatment provided moderate relief.      Review of Systems  Constitutional: Positive for malaise/fatigue.  Eyes: Negative for blurred vision.  Respiratory: Negative for shortness of breath.   Gastrointestinal: Positive for constipation and heartburn.  All other systems reviewed and are negative.      Objective:   Physical Exam Vitals reviewed.  Constitutional:      General: She is not in acute distress.    Appearance: She is well-developed.  HENT:     Head: Normocephalic and atraumatic.     Right Ear: Tympanic membrane normal.     Left Ear: Tympanic membrane normal.  Eyes:     Pupils: Pupils are equal, round, and reactive to light.  Neck:     Thyroid: No thyromegaly.  Cardiovascular:     Rate and Rhythm: Normal rate and regular rhythm.     Heart sounds: Normal heart sounds. No murmur heard.   Pulmonary:     Effort: Pulmonary effort is normal. No respiratory distress.     Breath sounds: Normal breath sounds. No wheezing.  Abdominal:     General: Bowel sounds are normal. There is no distension.     Palpations: Abdomen is soft.     Tenderness: There is no abdominal tenderness.  Musculoskeletal:        General: No tenderness. Normal range of motion.  Cervical back: Normal range of motion and neck supple.  Skin:    General: Skin is warm and dry.  Neurological:     Mental Status: She is alert and oriented to person, place, and time.     Cranial Nerves: No cranial nerve deficit.     Motor: Weakness (using cane to walk) present.     Deep Tendon  Reflexes: Reflexes are normal and symmetric.  Psychiatric:        Behavior: Behavior normal.        Thought Content: Thought content normal.        Judgment: Judgment normal.          BP 123/71   Pulse 89   Temp 98 F (36.7 C) (Temporal)   Ht 5' (1.524 m)   Wt 158 lb 9.6 oz (71.9 kg)   SpO2 96%   BMI 30.97 kg/m   Assessment & Plan:  Gabriella Ferguson comes in today with chief complaint of Follow-up   Diagnosis and orders addressed:  1. Essential hypertension - CMP14+EGFR - CBC with Differential/Platelet  2. Gastroesophageal reflux disease, unspecified whether esophagitis present - CMP14+EGFR - CBC with Differential/Platelet  3. Hyperlipidemia associated with type 2 diabetes mellitus (HCC) - CMP14+EGFR - CBC with Differential/Platelet  4. Type 2 diabetes mellitus with hyperglycemia, without long-term current use of insulin (HCC) - CMP14+EGFR - CBC with Differential/Platelet  5. Constipation, unspecified constipation type - CMP14+EGFR - CBC with Differential/Platelet   Labs pending Health Maintenance reviewed Diet and exercise encouraged  Follow up plan:  2 months for DM    Gabriella Dun, FNP

## 2020-11-08 NOTE — Patient Instructions (Signed)
Diabetes Mellitus and Nutrition, Adult When you have diabetes, or diabetes mellitus, it is very important to have healthy eating habits because your blood sugar (glucose) levels are greatly affected by what you eat and drink. Eating healthy foods in the right amounts, at about the same times every day, can help you:  Control your blood glucose.  Lower your risk of heart disease.  Improve your blood pressure.  Reach or maintain a healthy weight. What can affect my meal plan? Every person with diabetes is different, and each person has different needs for a meal plan. Your health care provider may recommend that you work with a dietitian to make a meal plan that is best for you. Your meal plan may vary depending on factors such as:  The calories you need.  The medicines you take.  Your weight.  Your blood glucose, blood pressure, and cholesterol levels.  Your activity level.  Other health conditions you have, such as heart or kidney disease. How do carbohydrates affect me? Carbohydrates, also called carbs, affect your blood glucose level more than any other type of food. Eating carbs naturally raises the amount of glucose in your blood. Carb counting is a method for keeping track of how many carbs you eat. Counting carbs is important to keep your blood glucose at a healthy level, especially if you use insulin or take certain oral diabetes medicines. It is important to know how many carbs you can safely have in each meal. This is different for every person. Your dietitian can help you calculate how many carbs you should have at each meal and for each snack. How does alcohol affect me? Alcohol can cause a sudden decrease in blood glucose (hypoglycemia), especially if you use insulin or take certain oral diabetes medicines. Hypoglycemia can be a life-threatening condition. Symptoms of hypoglycemia, such as sleepiness, dizziness, and confusion, are similar to symptoms of having too much  alcohol.  Do not drink alcohol if: ? Your health care provider tells you not to drink. ? You are pregnant, may be pregnant, or are planning to become pregnant.  If you drink alcohol: ? Do not drink on an empty stomach. ? Limit how much you use to:  0-1 drink a day for women.  0-2 drinks a day for men. ? Be aware of how much alcohol is in your drink. In the [place] one drink equals one 12 oz bottle of beer (355 mL), one 5 oz glass of wine (148 mL), or one 1 oz glass of hard liquor (44 mL). ? Keep yourself hydrated with water, diet soda, or unsweetened iced tea.  Keep in mind that regular soda, juice, and other mixers may contain a lot of sugar and must be counted as carbs. What are tips for following this plan? Reading food labels  Start by checking the serving size on the "Nutrition Facts" label of packaged foods and drinks. The amount of calories, carbs, fats, and other nutrients listed on the label is based on one serving of the item. Many items contain more than one serving per package.  Check the total grams (g) of carbs in one serving. You can calculate the number of servings of carbs in one serving by dividing the total carbs by 15. For example, if a food has 30 g of total carbs per serving, it would be equal to 2 servings of carbs.  Check the number of grams (g) of saturated fats and trans fats in one serving. Choose foods that have   a low amount or none of these fats.  Check the number of milligrams (mg) of salt (sodium) in one serving. Most people should limit total sodium intake to less than 2,300 mg per day.  Always check the nutrition information of foods labeled as "low-fat" or "nonfat." These foods may be higher in added sugar or refined carbs and should be avoided.  Talk to your dietitian to identify your daily goals for nutrients listed on the label. Shopping  Avoid buying canned, pre-made, or processed foods. These foods tend to be high in fat, sodium, and added  sugar.  Shop around the outside edge of the grocery store. This is where you will most often find fresh fruits and vegetables, bulk grains, fresh meats, and fresh dairy. Cooking  Use low-heat cooking methods, such as baking, instead of high-heat cooking methods like deep frying.  Cook using healthy oils, such as olive, canola, or sunflower oil.  Avoid cooking with butter, cream, or high-fat meats. Meal planning  Eat meals and snacks regularly, preferably at the same times every day. Avoid going long periods of time without eating.  Eat foods that are high in fiber, such as fresh fruits, vegetables, beans, and whole grains. Talk with your dietitian about how many servings of carbs you can eat at each meal.  Eat 4-6 oz (112-168 g) of lean protein each day, such as lean meat, chicken, fish, eggs, or tofu. One ounce (oz) of lean protein is equal to: ? 1 oz (28 g) of meat, chicken, or fish. ? 1 egg. ?  cup (62 g) of tofu.  Eat some foods each day that contain healthy fats, such as avocado, nuts, seeds, and fish.   What foods should I eat? Fruits Berries. Apples. Oranges. Peaches. Apricots. Plums. Grapes. Mango. Papaya. Pomegranate. Kiwi. Cherries. Vegetables Lettuce. Spinach. Leafy greens, including kale, chard, collard greens, and mustard greens. Beets. Cauliflower. Cabbage. Broccoli. Carrots. Green beans. Tomatoes. Peppers. Onions. Cucumbers. Brussels sprouts. Grains Whole grains, such as whole-wheat or whole-grain bread, crackers, tortillas, cereal, and pasta. Unsweetened oatmeal. Quinoa. Brown or wild rice. Meats and other proteins Seafood. Poultry without skin. Lean cuts of poultry and beef. Tofu. Nuts. Seeds. Dairy Low-fat or fat-free dairy products such as milk, yogurt, and cheese. The items listed above may not be a complete list of foods and beverages you can eat. Contact a dietitian for more information. What foods should I avoid? Fruits Fruits canned with  syrup. Vegetables Canned vegetables. Frozen vegetables with butter or cream sauce. Grains Refined white flour and flour products such as bread, pasta, snack foods, and cereals. Avoid all processed foods. Meats and other proteins Fatty cuts of meat. Poultry with skin. Breaded or fried meats. Processed meat. Avoid saturated fats. Dairy Full-fat yogurt, cheese, or milk. Beverages Sweetened drinks, such as soda or iced tea. The items listed above may not be a complete list of foods and beverages you should avoid. Contact a dietitian for more information. Questions to ask a health care provider  Do I need to meet with a diabetes educator?  Do I need to meet with a dietitian?  What number can I call if I have questions?  When are the best times to check my blood glucose? Where to find more information:  American Diabetes Association: diabetes.org  Academy of Nutrition and Dietetics: www.eatright.org  National Institute of Diabetes and Digestive and Kidney Diseases: www.niddk.nih.gov  Association of Diabetes Care and Education Specialists: www.diabeteseducator.org Summary  It is important to have healthy eating   habits because your blood sugar (glucose) levels are greatly affected by what you eat and drink.  A healthy meal plan will help you control your blood glucose and maintain a healthy lifestyle.  Your health care provider may recommend that you work with a dietitian to make a meal plan that is best for you.  Keep in mind that carbohydrates (carbs) and alcohol have immediate effects on your blood glucose levels. It is important to count carbs and to use alcohol carefully. This information is not intended to replace advice given to you by your health care provider. Make sure you discuss any questions you have with your health care provider. Document Revised: 07/21/2019 Document Reviewed: 07/21/2019 Elsevier Patient Education  2021 Elsevier Inc.  

## 2020-12-01 ENCOUNTER — Other Ambulatory Visit: Payer: Self-pay | Admitting: Family

## 2020-12-01 DIAGNOSIS — E1169 Type 2 diabetes mellitus with other specified complication: Secondary | ICD-10-CM

## 2020-12-01 DIAGNOSIS — I10 Essential (primary) hypertension: Secondary | ICD-10-CM

## 2020-12-01 DIAGNOSIS — E785 Hyperlipidemia, unspecified: Secondary | ICD-10-CM

## 2020-12-02 DIAGNOSIS — R55 Syncope and collapse: Secondary | ICD-10-CM | POA: Diagnosis not present

## 2020-12-02 DIAGNOSIS — I959 Hypotension, unspecified: Secondary | ICD-10-CM | POA: Diagnosis not present

## 2020-12-02 DIAGNOSIS — R0689 Other abnormalities of breathing: Secondary | ICD-10-CM | POA: Diagnosis not present

## 2020-12-02 DIAGNOSIS — E1165 Type 2 diabetes mellitus with hyperglycemia: Secondary | ICD-10-CM | POA: Diagnosis not present

## 2020-12-03 DIAGNOSIS — E119 Type 2 diabetes mellitus without complications: Secondary | ICD-10-CM | POA: Diagnosis not present

## 2020-12-03 DIAGNOSIS — S0081XA Abrasion of other part of head, initial encounter: Secondary | ICD-10-CM | POA: Diagnosis not present

## 2020-12-03 DIAGNOSIS — R778 Other specified abnormalities of plasma proteins: Secondary | ICD-10-CM | POA: Diagnosis not present

## 2020-12-03 DIAGNOSIS — I129 Hypertensive chronic kidney disease with stage 1 through stage 4 chronic kidney disease, or unspecified chronic kidney disease: Secondary | ICD-10-CM | POA: Diagnosis not present

## 2020-12-03 DIAGNOSIS — I340 Nonrheumatic mitral (valve) insufficiency: Secondary | ICD-10-CM | POA: Diagnosis not present

## 2020-12-03 DIAGNOSIS — I517 Cardiomegaly: Secondary | ICD-10-CM | POA: Diagnosis not present

## 2020-12-03 DIAGNOSIS — E870 Hyperosmolality and hypernatremia: Secondary | ICD-10-CM | POA: Diagnosis not present

## 2020-12-03 DIAGNOSIS — E1122 Type 2 diabetes mellitus with diabetic chronic kidney disease: Secondary | ICD-10-CM | POA: Diagnosis not present

## 2020-12-03 DIAGNOSIS — Z20822 Contact with and (suspected) exposure to covid-19: Secondary | ICD-10-CM | POA: Diagnosis not present

## 2020-12-03 DIAGNOSIS — N179 Acute kidney failure, unspecified: Secondary | ICD-10-CM | POA: Diagnosis not present

## 2020-12-03 DIAGNOSIS — Z043 Encounter for examination and observation following other accident: Secondary | ICD-10-CM | POA: Diagnosis not present

## 2020-12-03 DIAGNOSIS — R55 Syncope and collapse: Secondary | ICD-10-CM | POA: Diagnosis not present

## 2020-12-03 DIAGNOSIS — N189 Chronic kidney disease, unspecified: Secondary | ICD-10-CM | POA: Diagnosis not present

## 2020-12-03 DIAGNOSIS — E1121 Type 2 diabetes mellitus with diabetic nephropathy: Secondary | ICD-10-CM | POA: Diagnosis not present

## 2020-12-03 DIAGNOSIS — I5189 Other ill-defined heart diseases: Secondary | ICD-10-CM | POA: Diagnosis not present

## 2020-12-03 DIAGNOSIS — E889 Metabolic disorder, unspecified: Secondary | ICD-10-CM | POA: Diagnosis not present

## 2020-12-03 DIAGNOSIS — I248 Other forms of acute ischemic heart disease: Secondary | ICD-10-CM | POA: Diagnosis not present

## 2020-12-03 DIAGNOSIS — E785 Hyperlipidemia, unspecified: Secondary | ICD-10-CM | POA: Diagnosis not present

## 2020-12-03 DIAGNOSIS — E1165 Type 2 diabetes mellitus with hyperglycemia: Secondary | ICD-10-CM | POA: Diagnosis not present

## 2020-12-03 DIAGNOSIS — I10 Essential (primary) hypertension: Secondary | ICD-10-CM | POA: Diagnosis not present

## 2020-12-03 DIAGNOSIS — S0083XA Contusion of other part of head, initial encounter: Secondary | ICD-10-CM | POA: Diagnosis not present

## 2020-12-03 DIAGNOSIS — Z794 Long term (current) use of insulin: Secondary | ICD-10-CM | POA: Diagnosis not present

## 2020-12-03 DIAGNOSIS — S0990XA Unspecified injury of head, initial encounter: Secondary | ICD-10-CM | POA: Diagnosis not present

## 2020-12-03 DIAGNOSIS — R4182 Altered mental status, unspecified: Secondary | ICD-10-CM | POA: Diagnosis not present

## 2020-12-03 DIAGNOSIS — I951 Orthostatic hypotension: Secondary | ICD-10-CM | POA: Diagnosis not present

## 2020-12-08 ENCOUNTER — Other Ambulatory Visit: Payer: Self-pay | Admitting: Family

## 2020-12-08 DIAGNOSIS — Z794 Long term (current) use of insulin: Secondary | ICD-10-CM | POA: Diagnosis not present

## 2020-12-08 DIAGNOSIS — Z7982 Long term (current) use of aspirin: Secondary | ICD-10-CM | POA: Diagnosis not present

## 2020-12-08 DIAGNOSIS — Z9181 History of falling: Secondary | ICD-10-CM | POA: Diagnosis not present

## 2020-12-08 DIAGNOSIS — E1122 Type 2 diabetes mellitus with diabetic chronic kidney disease: Secondary | ICD-10-CM | POA: Diagnosis not present

## 2020-12-08 DIAGNOSIS — N189 Chronic kidney disease, unspecified: Secondary | ICD-10-CM | POA: Diagnosis not present

## 2020-12-08 DIAGNOSIS — E1165 Type 2 diabetes mellitus with hyperglycemia: Secondary | ICD-10-CM

## 2020-12-08 DIAGNOSIS — Z7984 Long term (current) use of oral hypoglycemic drugs: Secondary | ICD-10-CM | POA: Diagnosis not present

## 2020-12-08 DIAGNOSIS — R55 Syncope and collapse: Secondary | ICD-10-CM | POA: Diagnosis not present

## 2020-12-08 DIAGNOSIS — Z7983 Long term (current) use of bisphosphonates: Secondary | ICD-10-CM | POA: Diagnosis not present

## 2020-12-08 DIAGNOSIS — E785 Hyperlipidemia, unspecified: Secondary | ICD-10-CM | POA: Diagnosis not present

## 2020-12-08 DIAGNOSIS — I129 Hypertensive chronic kidney disease with stage 1 through stage 4 chronic kidney disease, or unspecified chronic kidney disease: Secondary | ICD-10-CM | POA: Diagnosis not present

## 2020-12-08 DIAGNOSIS — N179 Acute kidney failure, unspecified: Secondary | ICD-10-CM | POA: Diagnosis not present

## 2020-12-15 ENCOUNTER — Ambulatory Visit (INDEPENDENT_AMBULATORY_CARE_PROVIDER_SITE_OTHER): Payer: Medicare Other | Admitting: Family

## 2020-12-15 ENCOUNTER — Other Ambulatory Visit: Payer: Self-pay | Admitting: Family

## 2020-12-15 ENCOUNTER — Encounter: Payer: Self-pay | Admitting: Family

## 2020-12-15 ENCOUNTER — Other Ambulatory Visit: Payer: Self-pay

## 2020-12-15 VITALS — BP 111/61 | HR 64 | Temp 97.6°F | Ht 60.0 in | Wt 158.2 lb

## 2020-12-15 DIAGNOSIS — R350 Frequency of micturition: Secondary | ICD-10-CM | POA: Diagnosis not present

## 2020-12-15 DIAGNOSIS — R42 Dizziness and giddiness: Secondary | ICD-10-CM

## 2020-12-15 DIAGNOSIS — Z09 Encounter for follow-up examination after completed treatment for conditions other than malignant neoplasm: Secondary | ICD-10-CM | POA: Diagnosis not present

## 2020-12-15 DIAGNOSIS — E1165 Type 2 diabetes mellitus with hyperglycemia: Secondary | ICD-10-CM

## 2020-12-15 DIAGNOSIS — I10 Essential (primary) hypertension: Secondary | ICD-10-CM | POA: Diagnosis not present

## 2020-12-15 LAB — MICROSCOPIC EXAMINATION
Epithelial Cells (non renal): NONE SEEN /hpf (ref 0–10)
RBC, Urine: NONE SEEN /hpf (ref 0–2)
WBC, UA: >30 /hpf — AB (ref 0–5)

## 2020-12-15 LAB — URINALYSIS, COMPLETE
Bilirubin, UA: NEGATIVE
Ketones, UA: NEGATIVE
Nitrite, UA: POSITIVE — AB
Specific Gravity, UA: 1.02 (ref 1.005–1.030)
Urobilinogen, Ur: 0.2 mg/dL (ref 0.2–1.0)
pH, UA: 5 (ref 5.0–7.5)

## 2020-12-15 LAB — BAYER DCA HB A1C WAIVED: HB A1C (BAYER DCA - WAIVED): >14 % — ABNORMAL HIGH (ref <7.0)

## 2020-12-15 MED ORDER — BLOOD GLUCOSE METER KIT: PACK | 0 refills | Status: AC

## 2020-12-15 MED ORDER — CEPHALEXIN 500 MG PO CAPS: 500.0000 mg | ORAL_CAPSULE | Freq: Two times a day (BID) | ORAL | 0 refills | Status: DC

## 2020-12-15 NOTE — Progress Notes (Signed)
Subjective:    Patient ID: Gabriella Ferguson, female    DOB: September 29, 1928, 87 y.o.   MRN: [id number]  Chief Complaint  Patient presents with  . Hospitalization Follow-up    Passed out sugar was over 600   Pt presents to the office today for hospital follow up. She went to the ED on 12/03/20 after a syncope episode.  She was found to have a glucose greater than 600. Pt has been seen multiple times in the office but had refused all injections.   She states she was started on Lantus and her daughter has been giving it to her every night.   She had a negative CT scan.   Home Health was ordered to help with medication compliance and monitoring glucose.  Dizziness This is a new problem. The current episode started more than 1 month ago. The problem occurs intermittently. The problem has been waxing and waning. Associated symptoms include fatigue. Pertinent negatives include no vomiting.  Diabetes She presents for her follow-up diabetic visit. She has type 2 diabetes mellitus. Hypoglycemia symptoms include dizziness. Associated symptoms include fatigue and foot paresthesias. Pertinent negatives for diabetes include no blurred vision. Hypoglycemia complications include hospitalization. Risk factors for coronary artery disease include diabetes mellitus, hypertension and sedentary lifestyle. (Does not check at home) An ACE inhibitor/angiotensin II receptor blocker is being taken.  Urinary Frequency  This is a recurrent problem. The current episode started 1 to 4 weeks ago. The problem occurs every urination. Associated symptoms include frequency. Pertinent negatives include no vomiting.      Review of Systems  Constitutional: Positive for fatigue.  Eyes: Negative for blurred vision.  Gastrointestinal: Negative for vomiting.  Genitourinary: Positive for frequency.  Neurological: Positive for dizziness.  All other systems reviewed and are negative.      Objective:   Physical Exam Vitals  reviewed.  Constitutional:      General: She is not in acute distress.    Appearance: She is well-developed.  HENT:     Head: Normocephalic and atraumatic.  Eyes:     Pupils: Pupils are equal, round, and reactive to light.  Neck:     Thyroid: No thyromegaly.  Cardiovascular:     Rate and Rhythm: Normal rate and regular rhythm.     Heart sounds: Normal heart sounds. No murmur heard.   Pulmonary:     Effort: Pulmonary effort is normal. No respiratory distress.     Breath sounds: Normal breath sounds. No wheezing.  Abdominal:     General: Bowel sounds are normal. There is no distension.     Palpations: Abdomen is soft.     Tenderness: There is no abdominal tenderness.  Musculoskeletal:        General: No tenderness. Normal range of motion.     Cervical back: Normal range of motion and neck supple.  Skin:    General: Skin is warm and dry.  Neurological:     Mental Status: She is alert and oriented to person, place, and time.     Cranial Nerves: No cranial nerve deficit.     Motor: Weakness present.     Gait: Gait abnormal.     Deep Tendon Reflexes: Reflexes are normal and symmetric.     Comments: Using cane  Psychiatric:        Behavior: Behavior normal.        Thought Content: Thought content normal.        Judgment: Judgment normal.  BP 111/61   Pulse 64   Temp 97.6 F (36.4 C) (Temporal)   Ht 5' (1.524 m)   Wt 158 lb 3.2 oz (71.8 kg)   BMI 30.90 kg/m      Assessment & Plan:  Gabriella Ferguson comes in today with chief complaint of Hospitalization Follow-up (Passed out sugar was over 600)   Diagnosis and orders addressed:  1. Hospital discharge follow-up  - CMP14+EGFR - CBC with Differential/Platelet  2. Essential hypertension Hold Losartan 25 mg today - CMP14+EGFR - CBC with Differential/Platelet  3. Type 2 diabetes mellitus with hyperglycemia, without long-term current use of insulin ([place]) Labs pending, may need to increase  - blood glucose  meter kit and supplies; Dispense based on patient and insurance preference. Use up to four times daily as directed. E11.9  Dispense: 1 each; Refill: 0 - Bayer DCA Hb A1c Waived - CMP14+EGFR - CBC with Differential/Platelet  4. Dizziness I believe this could be related to hypotension. - CMP14+EGFR - CBC with Differential/Platelet  5. Urinary frequency - Urinalysis, Complete  Labs pending Health Maintenance reviewed Diet and exercise encouraged  Follow up plan: 2 weeks   [name], FNP

## 2020-12-15 NOTE — Addendum Note (Signed)
Addended by: Everlean Cherry on: 12/15/2020 12:13 PM   Modules accepted: Orders

## 2020-12-15 NOTE — Patient Instructions (Signed)
Diabetes Mellitus and Nutrition, Adult When you have diabetes, or diabetes mellitus, it is very important to have healthy eating habits because your blood sugar (glucose) levels are greatly affected by what you eat and drink. Eating healthy foods in the right amounts, at about the same times every day, can help you:  Control your blood glucose.  Lower your risk of heart disease.  Improve your blood pressure.  Reach or maintain a healthy weight. What can affect my meal plan? Every person with diabetes is different, and each person has different needs for a meal plan. Your health care provider may recommend that you work with a dietitian to make a meal plan that is best for you. Your meal plan may vary depending on factors such as:  The calories you need.  The medicines you take.  Your weight.  Your blood glucose, blood pressure, and cholesterol levels.  Your activity level.  Other health conditions you have, such as heart or kidney disease. How do carbohydrates affect me? Carbohydrates, also called carbs, affect your blood glucose level more than any other type of food. Eating carbs naturally raises the amount of glucose in your blood. Carb counting is a method for keeping track of how many carbs you eat. Counting carbs is important to keep your blood glucose at a healthy level, especially if you use insulin or take certain oral diabetes medicines. It is important to know how many carbs you can safely have in each meal. This is different for every person. Your dietitian can help you calculate how many carbs you should have at each meal and for each snack. How does alcohol affect me? Alcohol can cause a sudden decrease in blood glucose (hypoglycemia), especially if you use insulin or take certain oral diabetes medicines. Hypoglycemia can be a life-threatening condition. Symptoms of hypoglycemia, such as sleepiness, dizziness, and confusion, are similar to symptoms of having too much  alcohol.  Do not drink alcohol if: ? Your health care provider tells you not to drink. ? You are pregnant, may be pregnant, or are planning to become pregnant.  If you drink alcohol: ? Do not drink on an empty stomach. ? Limit how much you use to:  0-1 drink a day for women.  0-2 drinks a day for men. ? Be aware of how much alcohol is in your drink. In the [place] one drink equals one 12 oz bottle of beer (355 mL), one 5 oz glass of wine (148 mL), or one 1 oz glass of hard liquor (44 mL). ? Keep yourself hydrated with water, diet soda, or unsweetened iced tea.  Keep in mind that regular soda, juice, and other mixers may contain a lot of sugar and must be counted as carbs. What are tips for following this plan? Reading food labels  Start by checking the serving size on the "Nutrition Facts" label of packaged foods and drinks. The amount of calories, carbs, fats, and other nutrients listed on the label is based on one serving of the item. Many items contain more than one serving per package.  Check the total grams (g) of carbs in one serving. You can calculate the number of servings of carbs in one serving by dividing the total carbs by 15. For example, if a food has 30 g of total carbs per serving, it would be equal to 2 servings of carbs.  Check the number of grams (g) of saturated fats and trans fats in one serving. Choose foods that have   a low amount or none of these fats.  Check the number of milligrams (mg) of salt (sodium) in one serving. Most people should limit total sodium intake to less than 2,300 mg per day.  Always check the nutrition information of foods labeled as "low-fat" or "nonfat." These foods may be higher in added sugar or refined carbs and should be avoided.  Talk to your dietitian to identify your daily goals for nutrients listed on the label. Shopping  Avoid buying canned, pre-made, or processed foods. These foods tend to be high in fat, sodium, and added  sugar.  Shop around the outside edge of the grocery store. This is where you will most often find fresh fruits and vegetables, bulk grains, fresh meats, and fresh dairy. Cooking  Use low-heat cooking methods, such as baking, instead of high-heat cooking methods like deep frying.  Cook using healthy oils, such as olive, canola, or sunflower oil.  Avoid cooking with butter, cream, or high-fat meats. Meal planning  Eat meals and snacks regularly, preferably at the same times every day. Avoid going long periods of time without eating.  Eat foods that are high in fiber, such as fresh fruits, vegetables, beans, and whole grains. Talk with your dietitian about how many servings of carbs you can eat at each meal.  Eat 4-6 oz (112-168 g) of lean protein each day, such as lean meat, chicken, fish, eggs, or tofu. One ounce (oz) of lean protein is equal to: ? 1 oz (28 g) of meat, chicken, or fish. ? 1 egg. ?  cup (62 g) of tofu.  Eat some foods each day that contain healthy fats, such as avocado, nuts, seeds, and fish.   What foods should I eat? Fruits Berries. Apples. Oranges. Peaches. Apricots. Plums. Grapes. Mango. Papaya. Pomegranate. Kiwi. Cherries. Vegetables Lettuce. Spinach. Leafy greens, including kale, chard, collard greens, and mustard greens. Beets. Cauliflower. Cabbage. Broccoli. Carrots. Green beans. Tomatoes. Peppers. Onions. Cucumbers. Brussels sprouts. Grains Whole grains, such as whole-wheat or whole-grain bread, crackers, tortillas, cereal, and pasta. Unsweetened oatmeal. Quinoa. Brown or wild rice. Meats and other proteins Seafood. Poultry without skin. Lean cuts of poultry and beef. Tofu. Nuts. Seeds. Dairy Low-fat or fat-free dairy products such as milk, yogurt, and cheese. The items listed above may not be a complete list of foods and beverages you can eat. Contact a dietitian for more information. What foods should I avoid? Fruits Fruits canned with  syrup. Vegetables Canned vegetables. Frozen vegetables with butter or cream sauce. Grains Refined white flour and flour products such as bread, pasta, snack foods, and cereals. Avoid all processed foods. Meats and other proteins Fatty cuts of meat. Poultry with skin. Breaded or fried meats. Processed meat. Avoid saturated fats. Dairy Full-fat yogurt, cheese, or milk. Beverages Sweetened drinks, such as soda or iced tea. The items listed above may not be a complete list of foods and beverages you should avoid. Contact a dietitian for more information. Questions to ask a health care provider  Do I need to meet with a diabetes educator?  Do I need to meet with a dietitian?  What number can I call if I have questions?  When are the best times to check my blood glucose? Where to find more information:  American Diabetes Association: diabetes.org  Academy of Nutrition and Dietetics: www.eatright.org  National Institute of Diabetes and Digestive and Kidney Diseases: www.niddk.nih.gov  Association of Diabetes Care and Education Specialists: www.diabeteseducator.org Summary  It is important to have healthy eating   habits because your blood sugar (glucose) levels are greatly affected by what you eat and drink.  A healthy meal plan will help you control your blood glucose and maintain a healthy lifestyle.  Your health care provider may recommend that you work with a dietitian to make a meal plan that is best for you.  Keep in mind that carbohydrates (carbs) and alcohol have immediate effects on your blood glucose levels. It is important to count carbs and to use alcohol carefully. This information is not intended to replace advice given to you by your health care provider. Make sure you discuss any questions you have with your health care provider. Document Revised: 07/21/2019 Document Reviewed: 07/21/2019 Elsevier Patient Education  2021 Elsevier Inc.  

## 2020-12-16 LAB — CBC WITH DIFFERENTIAL/PLATELET
Basophils Absolute: 0 10*3/uL (ref 0.0–0.2)
Basos: 0 %
EOS (ABSOLUTE): 0.1 10*3/uL (ref 0.0–0.4)
Eos: 1 %
Hematocrit: 41.4 % (ref 34.0–46.6)
Hemoglobin: 13.1 g/dL (ref 11.1–15.9)
Immature Grans (Abs): 0 10*3/uL (ref 0.0–0.1)
Immature Granulocytes: 0 %
Lymphocytes Absolute: 3.1 10*3/uL (ref 0.7–3.1)
Lymphs: 34 %
MCH: 27.8 pg (ref 26.6–33.0)
MCHC: 31.6 g/dL (ref 31.5–35.7)
MCV: 88 fL (ref 79–97)
Monocytes Absolute: 0.7 10*3/uL (ref 0.1–0.9)
Monocytes: 7 %
Neutrophils Absolute: 5.3 10*3/uL (ref 1.4–7.0)
Neutrophils: 58 %
Platelets: 259 10*3/uL (ref 150–450)
RBC: 4.72 x10E6/uL (ref 3.77–5.28)
RDW: 12.8 % (ref 11.7–15.4)
WBC: 9.2 10*3/uL (ref 3.4–10.8)

## 2020-12-16 LAB — CMP14+EGFR
ALT: 10 IU/L (ref 0–32)
AST: 14 IU/L (ref 0–40)
Albumin/Globulin Ratio: 1.5 (ref 1.2–2.2)
Albumin: 4.1 g/dL (ref 3.6–4.6)
Alkaline Phosphatase: 73 IU/L (ref 44–121)
BUN/Creatinine Ratio: 12 (ref 12–28)
BUN: 15 mg/dL (ref 8–27)
Bilirubin Total: 0.2 mg/dL (ref 0.0–1.2)
CO2: 24 mmol/L (ref 20–29)
Calcium: 10 mg/dL (ref 8.7–10.3)
Chloride: 98 mmol/L (ref 96–106)
Creatinine, Ser: 1.24 mg/dL — ABNORMAL HIGH (ref 0.57–1.00)
Globulin, Total: 2.8 g/dL (ref 1.5–4.5)
Glucose: 218 mg/dL — ABNORMAL HIGH (ref 65–99)
Potassium: 4.2 mmol/L (ref 3.5–5.2)
Sodium: 141 mmol/L (ref 134–144)
Total Protein: 6.9 g/dL (ref 6.0–8.5)
eGFR: 42 mL/min/{1.73_m2} — ABNORMAL LOW (ref >59)

## 2020-12-21 LAB — URINE CULTURE

## 2020-12-23 ENCOUNTER — Other Ambulatory Visit: Payer: Self-pay | Admitting: Family

## 2020-12-28 ENCOUNTER — Ambulatory Visit (INDEPENDENT_AMBULATORY_CARE_PROVIDER_SITE_OTHER): Payer: Medicare Other | Admitting: Family

## 2020-12-28 ENCOUNTER — Other Ambulatory Visit: Payer: Self-pay

## 2020-12-28 ENCOUNTER — Encounter: Payer: Self-pay | Admitting: Family

## 2020-12-28 VITALS — BP 120/63 | HR 67 | Temp 98.0°F | Ht 60.0 in | Wt 157.0 lb

## 2020-12-28 DIAGNOSIS — R42 Dizziness and giddiness: Secondary | ICD-10-CM | POA: Diagnosis not present

## 2020-12-28 DIAGNOSIS — I10 Essential (primary) hypertension: Secondary | ICD-10-CM

## 2020-12-28 DIAGNOSIS — E1165 Type 2 diabetes mellitus with hyperglycemia: Secondary | ICD-10-CM | POA: Diagnosis not present

## 2020-12-28 MED ORDER — AUM MINI INSULIN PEN NEEDLE 32G X 8 MM MISC: 1.0000 | Freq: Every day | 2 refills | Status: DC

## 2020-12-28 MED ORDER — LANTUS SOLOSTAR 100 UNIT/ML ~~LOC~~ SOPN: 15.0000 [IU] | PEN_INJECTOR | Freq: Every day | SUBCUTANEOUS | 3 refills | Status: DC

## 2020-12-28 NOTE — Patient Instructions (Signed)
Diabetes Mellitus and Nutrition, Adult When you have diabetes, or diabetes mellitus, it is very important to have healthy eating habits because your blood sugar (glucose) levels are greatly affected by what you eat and drink. Eating healthy foods in the right amounts, at about the same times every day, can help you:  Control your blood glucose.  Lower your risk of heart disease.  Improve your blood pressure.  Reach or maintain a healthy weight. What can affect my meal plan? Every person with diabetes is different, and each person has different needs for a meal plan. Your health care provider may recommend that you work with a dietitian to make a meal plan that is best for you. Your meal plan may vary depending on factors such as:  The calories you need.  The medicines you take.  Your weight.  Your blood glucose, blood pressure, and cholesterol levels.  Your activity level.  Other health conditions you have, such as heart or kidney disease. How do carbohydrates affect me? Carbohydrates, also called carbs, affect your blood glucose level more than any other type of food. Eating carbs naturally raises the amount of glucose in your blood. Carb counting is a method for keeping track of how many carbs you eat. Counting carbs is important to keep your blood glucose at a healthy level, especially if you use insulin or take certain oral diabetes medicines. It is important to know how many carbs you can safely have in each meal. This is different for every person. Your dietitian can help you calculate how many carbs you should have at each meal and for each snack. How does alcohol affect me? Alcohol can cause a sudden decrease in blood glucose (hypoglycemia), especially if you use insulin or take certain oral diabetes medicines. Hypoglycemia can be a life-threatening condition. Symptoms of hypoglycemia, such as sleepiness, dizziness, and confusion, are similar to symptoms of having too much  alcohol.  Do not drink alcohol if: ? Your health care provider tells you not to drink. ? You are pregnant, may be pregnant, or are planning to become pregnant.  If you drink alcohol: ? Do not drink on an empty stomach. ? Limit how much you use to:  0-1 drink a day for women.  0-2 drinks a day for men. ? Be aware of how much alcohol is in your drink. In the [place] one drink equals one 12 oz bottle of beer (355 mL), one 5 oz glass of wine (148 mL), or one 1 oz glass of hard liquor (44 mL). ? Keep yourself hydrated with water, diet soda, or unsweetened iced tea.  Keep in mind that regular soda, juice, and other mixers may contain a lot of sugar and must be counted as carbs. What are tips for following this plan? Reading food labels  Start by checking the serving size on the "Nutrition Facts" label of packaged foods and drinks. The amount of calories, carbs, fats, and other nutrients listed on the label is based on one serving of the item. Many items contain more than one serving per package.  Check the total grams (g) of carbs in one serving. You can calculate the number of servings of carbs in one serving by dividing the total carbs by 15. For example, if a food has 30 g of total carbs per serving, it would be equal to 2 servings of carbs.  Check the number of grams (g) of saturated fats and trans fats in one serving. Choose foods that have   a low amount or none of these fats.  Check the number of milligrams (mg) of salt (sodium) in one serving. Most people should limit total sodium intake to less than 2,300 mg per day.  Always check the nutrition information of foods labeled as "low-fat" or "nonfat." These foods may be higher in added sugar or refined carbs and should be avoided.  Talk to your dietitian to identify your daily goals for nutrients listed on the label. Shopping  Avoid buying canned, pre-made, or processed foods. These foods tend to be high in fat, sodium, and added  sugar.  Shop around the outside edge of the grocery store. This is where you will most often find fresh fruits and vegetables, bulk grains, fresh meats, and fresh dairy. Cooking  Use low-heat cooking methods, such as baking, instead of high-heat cooking methods like deep frying.  Cook using healthy oils, such as olive, canola, or sunflower oil.  Avoid cooking with butter, cream, or high-fat meats. Meal planning  Eat meals and snacks regularly, preferably at the same times every day. Avoid going long periods of time without eating.  Eat foods that are high in fiber, such as fresh fruits, vegetables, beans, and whole grains. Talk with your dietitian about how many servings of carbs you can eat at each meal.  Eat 4-6 oz (112-168 g) of lean protein each day, such as lean meat, chicken, fish, eggs, or tofu. One ounce (oz) of lean protein is equal to: ? 1 oz (28 g) of meat, chicken, or fish. ? 1 egg. ?  cup (62 g) of tofu.  Eat some foods each day that contain healthy fats, such as avocado, nuts, seeds, and fish.   What foods should I eat? Fruits Berries. Apples. Oranges. Peaches. Apricots. Plums. Grapes. Mango. Papaya. Pomegranate. Kiwi. Cherries. Vegetables Lettuce. Spinach. Leafy greens, including kale, chard, collard greens, and mustard greens. Beets. Cauliflower. Cabbage. Broccoli. Carrots. Green beans. Tomatoes. Peppers. Onions. Cucumbers. Brussels sprouts. Grains Whole grains, such as whole-wheat or whole-grain bread, crackers, tortillas, cereal, and pasta. Unsweetened oatmeal. Quinoa. Brown or wild rice. Meats and other proteins Seafood. Poultry without skin. Lean cuts of poultry and beef. Tofu. Nuts. Seeds. Dairy Low-fat or fat-free dairy products such as milk, yogurt, and cheese. The items listed above may not be a complete list of foods and beverages you can eat. Contact a dietitian for more information. What foods should I avoid? Fruits Fruits canned with  syrup. Vegetables Canned vegetables. Frozen vegetables with butter or cream sauce. Grains Refined white flour and flour products such as bread, pasta, snack foods, and cereals. Avoid all processed foods. Meats and other proteins Fatty cuts of meat. Poultry with skin. Breaded or fried meats. Processed meat. Avoid saturated fats. Dairy Full-fat yogurt, cheese, or milk. Beverages Sweetened drinks, such as soda or iced tea. The items listed above may not be a complete list of foods and beverages you should avoid. Contact a dietitian for more information. Questions to ask a health care provider  Do I need to meet with a diabetes educator?  Do I need to meet with a dietitian?  What number can I call if I have questions?  When are the best times to check my blood glucose? Where to find more information:  American Diabetes Association: diabetes.org  Academy of Nutrition and Dietetics: www.eatright.org  National Institute of Diabetes and Digestive and Kidney Diseases: www.niddk.nih.gov  Association of Diabetes Care and Education Specialists: www.diabeteseducator.org Summary  It is important to have healthy eating   habits because your blood sugar (glucose) levels are greatly affected by what you eat and drink.  A healthy meal plan will help you control your blood glucose and maintain a healthy lifestyle.  Your health care provider may recommend that you work with a dietitian to make a meal plan that is best for you.  Keep in mind that carbohydrates (carbs) and alcohol have immediate effects on your blood glucose levels. It is important to count carbs and to use alcohol carefully. This information is not intended to replace advice given to you by your health care provider. Make sure you discuss any questions you have with your health care provider. Document Revised: 07/21/2019 Document Reviewed: 07/21/2019 Elsevier Patient Education  2021 Elsevier Inc.  

## 2020-12-28 NOTE — Progress Notes (Signed)
Subjective:    Patient ID: Gabriella Ferguson, female    DOB: August 11, 1929, 87 y.o.   MRN: [id number]  Chief Complaint  Patient presents with  . Follow-up    2 week follow up    Pt presents to the office today to recheck HTN and dizziness. On her last visit, we held her losartan 25 mg. She reports she is continues to have dizziness.  Dizziness This is a chronic problem. The current episode started more than 1 year ago. The problem occurs intermittently. The problem has been waxing and waning.  Diabetes She presents for her follow-up diabetic visit. She has type 2 diabetes mellitus. Hypoglycemia symptoms include dizziness. Associated symptoms include blurred vision. She is following a generally unhealthy diet. Her overall blood glucose range is >200 mg/dl. An ACE inhibitor/angiotensin II receptor blocker is not being taken.  Hypertension This is a chronic problem. The current episode started more than 1 year ago. The problem has been resolved since onset. Associated symptoms include blurred vision and malaise/fatigue. Pertinent negatives include no peripheral edema or shortness of breath. The current treatment provides moderate improvement.      Review of Systems  Constitutional: Positive for malaise/fatigue.  Eyes: Positive for blurred vision.  Respiratory: Negative for shortness of breath.   Neurological: Positive for dizziness.  All other systems reviewed and are negative.      Objective:   Physical Exam Vitals reviewed.  Constitutional:      General: She is not in acute distress.    Appearance: She is well-developed.  HENT:     Head: Normocephalic and atraumatic.  Eyes:     Pupils: Pupils are equal, round, and reactive to light.  Neck:     Thyroid: No thyromegaly.  Cardiovascular:     Rate and Rhythm: Normal rate and regular rhythm.     Heart sounds: Murmur heard.    Pulmonary:     Effort: Pulmonary effort is normal. No respiratory distress.     Breath sounds: Normal  breath sounds. No wheezing.  Abdominal:     General: Bowel sounds are normal. There is no distension.     Palpations: Abdomen is soft.     Tenderness: There is no abdominal tenderness.  Musculoskeletal:        General: Tenderness present.     Cervical back: Normal range of motion and neck supple.  Skin:    General: Skin is warm and dry.  Neurological:     Mental Status: She is alert and oriented to person, place, and time.     Cranial Nerves: No cranial nerve deficit.     Motor: Weakness present.     Gait: Gait abnormal (using cane to walk).     Deep Tendon Reflexes: Reflexes are normal and symmetric.  Psychiatric:        Behavior: Behavior normal.        Thought Content: Thought content normal.        Judgment: Judgment normal.          BP 120/63   Pulse 67   Temp 98 F (36.7 C) (Temporal)   Ht 5' (1.524 m)   Wt 157 lb (71.2 kg)   BMI 30.66 kg/m   Assessment & Plan:  Gabriella Ferguson comes in today with chief complaint of Follow-up (2 week follow up )   Diagnosis and orders addressed:  1. Type 2 diabetes mellitus with hyperglycemia, without long-term current use of insulin (HCC) Will increase Lantus to 15  units from 10 units Strict low carb diet Continue other medications - LANTUS SOLOSTAR 100 UNIT/ML Solostar Pen; Inject 15 Units into the skin at bedtime.  Dispense: 15 mL; Refill: 3 - Insulin Pen Needle (AUM MINI INSULIN PEN NEEDLE) 32G X 8 MM MISC; 1 applicator by Does not apply route daily.  Dispense: 100 each; Refill: 2  2. Dizziness Improved since stopping Losartan  3. Essential hypertension Resolved    Health Maintenance reviewed Diet and exercise encouraged  Follow up plan: 1 month   Gabriella Dun, FNP

## 2021-01-02 ENCOUNTER — Other Ambulatory Visit: Payer: Self-pay | Admitting: Family

## 2021-01-02 NOTE — Telephone Encounter (Signed)
Gabriella Ferguson, is this something that patient is taking daily for prophylaxis?  It isn't on her current med list.  I see where she was prescribed this for a UTI on 12/15/20...Marland KitchenMarland Kitchen

## 2021-01-03 ENCOUNTER — Ambulatory Visit: Payer: Medicare Other | Admitting: Pharmacist

## 2021-01-07 DIAGNOSIS — N179 Acute kidney failure, unspecified: Secondary | ICD-10-CM | POA: Diagnosis not present

## 2021-01-07 DIAGNOSIS — I129 Hypertensive chronic kidney disease with stage 1 through stage 4 chronic kidney disease, or unspecified chronic kidney disease: Secondary | ICD-10-CM | POA: Diagnosis not present

## 2021-01-07 DIAGNOSIS — E785 Hyperlipidemia, unspecified: Secondary | ICD-10-CM | POA: Diagnosis not present

## 2021-01-07 DIAGNOSIS — N189 Chronic kidney disease, unspecified: Secondary | ICD-10-CM | POA: Diagnosis not present

## 2021-01-07 DIAGNOSIS — Z7983 Long term (current) use of bisphosphonates: Secondary | ICD-10-CM | POA: Diagnosis not present

## 2021-01-07 DIAGNOSIS — E1122 Type 2 diabetes mellitus with diabetic chronic kidney disease: Secondary | ICD-10-CM | POA: Diagnosis not present

## 2021-01-07 DIAGNOSIS — Z7982 Long term (current) use of aspirin: Secondary | ICD-10-CM | POA: Diagnosis not present

## 2021-01-07 DIAGNOSIS — R55 Syncope and collapse: Secondary | ICD-10-CM | POA: Diagnosis not present

## 2021-01-07 DIAGNOSIS — Z794 Long term (current) use of insulin: Secondary | ICD-10-CM | POA: Diagnosis not present

## 2021-01-07 DIAGNOSIS — E1165 Type 2 diabetes mellitus with hyperglycemia: Secondary | ICD-10-CM | POA: Diagnosis not present

## 2021-01-07 DIAGNOSIS — Z7984 Long term (current) use of oral hypoglycemic drugs: Secondary | ICD-10-CM | POA: Diagnosis not present

## 2021-01-07 DIAGNOSIS — Z9181 History of falling: Secondary | ICD-10-CM | POA: Diagnosis not present

## 2021-01-09 ENCOUNTER — Ambulatory Visit: Payer: Medicare Other | Admitting: Family

## 2021-01-09 ENCOUNTER — Other Ambulatory Visit: Payer: Self-pay | Admitting: Family

## 2021-01-09 DIAGNOSIS — M810 Age-related osteoporosis without current pathological fracture: Secondary | ICD-10-CM

## 2021-01-24 ENCOUNTER — Ambulatory Visit (INDEPENDENT_AMBULATORY_CARE_PROVIDER_SITE_OTHER): Payer: Medicare Other | Admitting: *Deleted

## 2021-01-24 DIAGNOSIS — Z0000 Encounter for general adult medical examination without abnormal findings: Secondary | ICD-10-CM | POA: Diagnosis not present

## 2021-01-24 NOTE — Progress Notes (Signed)
MEDICARE ANNUAL WELLNESS VISIT  01/24/2021  Telephone Visit Disclaimer This Medicare AWV was conducted by telephone due to national recommendations for restrictions regarding the COVID-19 Pandemic (e.g. social distancing).  I verified, using two identifiers, that I am speaking with Gabriella Ferguson or their authorized healthcare agent. I discussed the limitations, risks, security, and privacy concerns of performing an evaluation and management service by telephone and the potential availability of an in-person appointment in the future. The patient expressed understanding and agreed to proceed.  Location of Patient: Home Location of Provider (nurse): [facility]  Subjective:    Gabriella Ferguson is a 87 y.o. female patient of Hawks, Theador Hawthorne, FNP who had a Medicare Annual Wellness Visit today via telephone. Janal is retired and lives alone. She has 2 children. She reports that she is socially active and does interact with friends/family regularly. She is minimally physically active and enjoys reading and puzzles.   Patient Care Team: Sharion Balloon, FNP as PCP - General (Family Medicine) Lavera Guise, [facility] (Pharmacist) Celestia Khat, [place] (Optometry)  Advanced Directives 01/24/2021 01/22/2020 01/21/2019  Does Patient Have a Medical Advance Directive? No No No  Would patient like information on creating a medical advance directive? No - Guardian declined No - Patient declined No - Patient declined    Hospital Utilization Over the Past 12 Months: # of hospitalizations or ER visits: 1 # of surgeries: 0  Review of Systems    Patient reports that her overall health is worse compared to last year.  History obtained from the patient and patient chart.   Patient Reported Readings (BP, Pulse, CBG, Weight, etc) none  Pain Assessment Pain : 0-10 Pain Score: 2  Pain Type: Chronic pain Pain Location: Hip Pain Orientation: Left Pain Descriptors / Indicators: Discomfort Pain Onset:  More than a month ago Pain Frequency: Constant Pain Relieving Factors: rest Effect of Pain on Daily Activities: none  Pain Relieving Factors: rest  Current Medications & Allergies (verified) Allergies as of 01/24/2021      Reactions   Semaglutide    Red patches on skin s/p Rybelsus       Medication List       Accurate as of Jan 24, 2021 10:54 AM. If you have any questions, ask your nurse or doctor.        alendronate 70 MG tablet Commonly known as: FOSAMAX Take with a full glass of water on an empty stomach.   aspirin EC 81 MG tablet Take 81 mg by mouth daily.   AUM Mini Insulin Pen Needle 32G X 8 MM Misc Generic drug: Insulin Pen Needle 1 applicator by Does not apply route daily.   blood glucose meter kit and supplies Dispense based on patient and insurance preference. Use up to four times daily as directed. E11.9   Centrum Adults Tabs Take by mouth.   cephALEXin 250 MG capsule Commonly known as: KEFLEX TAKE (1) CAPSULE DAILY   gabapentin 300 MG capsule Commonly known as: NEURONTIN TAKE (1) CAPSULE TWICE DAILY.   glipiZIDE 2.5 MG 24 hr tablet Commonly known as: GLUCOTROL XL TAKE (1) TABLET DAILY WITH BREAKFAST.   Lantus SoloStar 100 UNIT/ML Solostar Pen Generic drug: insulin glargine Inject 15 Units into the skin at bedtime.   metFORMIN 500 MG tablet Commonly known as: GLUCOPHAGE Take 1 tablet (500 mg total) by mouth 2 (two) times daily with a meal.   mirabegron ER 25 MG Tb24 tablet Commonly known as: Myrbetriq Take 1  tablet (25 mg total) by mouth daily.   nystatin powder Commonly known as: MYCOSTATIN/NYSTOP Apply 1 application topically 3 (three) times daily.   Rybelsus 7 MG Tabs Generic drug: Semaglutide Take 7 mg by mouth daily.   simvastatin 20 MG tablet Commonly known as: ZOCOR TAKE 1 TABLET ONCE DAILY       History (reviewed): Past Medical History:  Diagnosis Date  . Breast cancer ([place])   . Diabetes mellitus without complication  (HCC)    Type 2  . Hip fracture ([place])   . Meningitis    Past Surgical History:  Procedure Laterality Date  . HIP CLOSED REDUCTION     Family History  Problem Relation Age of Onset  . Diabetes Daughter   . Stroke Mother   . Stroke Father    Social History   Socioeconomic History  . Marital status: Widowed    Spouse name: Not on file  . Number of children: 2  . Years of education: Not on file  . Highest education level: 10th grade  Occupational History  . Occupation: Retired  Tobacco Use  . Smoking status: Never Smoker  . Smokeless tobacco: Never Used  Vaping Use  . Vaping Use: Never used  Substance and Sexual Activity  . Alcohol use: No  . Drug use: No  . Sexual activity: Not Currently  Other Topics Concern  . Not on file  Social History Narrative   Lives alone in apartment, daughter visits daily and helps with medication and diabetes.    Social Determinants of Health   Financial Resource Strain: Not on file  Food Insecurity: Not on file  Transportation Needs: Not on file  Physical Activity: Not on file  Stress: Not on file  Social Connections: Not on file    Activities of Daily Living In your present state of health, do you have any difficulty performing the following activities: 01/24/2021  Hearing? N  Vision? N  Difficulty concentrating or making decisions? N  Walking or climbing stairs? Y  Comment walks with cane, only takes stairs with rails  Dressing or bathing? N  Doing errands, shopping? Y  Comment does not Physiological scientist and eating ? N  Using the Toilet? N  In the past six months, have you accidently leaked urine? N  Do you have problems with loss of bowel control? N  Managing your Medications? N  Managing your Finances? N  Housekeeping or managing your Housekeeping? N  Some recent data might be hidden    Patient Education/ Literacy How often do you need to have someone help you when you read instructions, pamphlets, or other written  materials from your doctor or pharmacy?: 1 - Never What is the last grade level you completed in school?: 9th  Exercise Current Exercise Habits: The patient does not participate in regular exercise at present, Exercise limited by: None identified  Diet Patient reports consuming 3 meals a day and 0 snack(s) a day Patient reports that her primary diet is: diabetic Patient reports that she does have regular access to food.   Depression Screen PHQ 2/9 Scores 01/24/2021 12/28/2020 12/15/2020 11/08/2020 10/10/2020 08/12/2020 07/07/2020  PHQ - 2 Score 0 0 0 0 0 0 0     Fall Risk Fall Risk  01/24/2021 12/28/2020 12/15/2020 11/08/2020 10/10/2020  Falls in the past year? 1 0 0 0 0  Number falls in past yr: 0 - - - -  Injury with Fall? 1 - - - -  Comment - - - - -  Risk Factor Category  - - - - -  Risk for fall due to : History of fall(s) - - - -  Follow up Falls evaluation completed - - - -     Objective:  Frederich Balding Mounts seemed alert and oriented and she participated appropriately during our telephone visit.  Blood Pressure Weight BMI  BP Readings from Last 3 Encounters:  12/28/20 120/63  12/15/20 111/61  11/08/20 123/71   Wt Readings from Last 3 Encounters:  12/28/20 157 lb (71.2 kg)  12/15/20 158 lb 3.2 oz (71.8 kg)  11/08/20 158 lb 9.6 oz (71.9 kg)   BMI Readings from Last 1 Encounters:  12/28/20 30.66 kg/m    *Unable to obtain current vital signs, weight, and BMI due to telephone visit type  Hearing/Vision  . Adeline did not seem to have difficulty with hearing/understanding during the telephone conversation . Reports that she has not had a formal eye exam by an eye care professional within the past year . Reports that she has not had a formal hearing evaluation within the past year *Unable to fully assess hearing and vision during telephone visit type  Cognitive Function: 6CIT Screen 01/24/2021 01/22/2020 01/21/2019  What Year? 0 points 0 points 0 points  What month? 0 points 0  points 0 points  What time? 0 points 0 points 0 points  Count back from 20 0 points 0 points 0 points  Months in reverse 0 points 0 points 0 points  Repeat phrase 10 points 4 points 0 points  Total Score 10 4 0   (Normal:0-7, Significant for Dysfunction: >8)  Normal Cognitive Function Screening: No: 10   Immunization & Health Maintenance Record Immunization History  Administered Date(s) Administered  . Fluad Quad(high Dose 65+) 06/10/2020  . Moderna Sars-Covid-2 Vaccination 10/26/2019, 11/23/2019, 07/19/2020  . Pneumococcal Conjugate-13 12/31/2016  . Pneumococcal Polysaccharide-23 04/09/2018  . Tdap 12/31/2016    Health Maintenance  Topic Date Due  . Zoster Vaccines- Shingrix (1 of 2) Never done  . OPHTHALMOLOGY EXAM  10/08/2019  . INFLUENZA VACCINE  03/27/2021  . URINE MICROALBUMIN  04/05/2021  . HEMOGLOBIN A1C  06/16/2021  . FOOT EXAM  10/10/2021  . TETANUS/TDAP  01/01/2027  . COVID-19 Vaccine  Completed  . PNA vac Low Risk Adult  Completed  . HPV VACCINES  Aged Out  . DEXA SCAN  Discontinued       Assessment  This is a routine wellness examination for [employer].  Health Maintenance: Due or Overdue Health Maintenance Due  Topic Date Due  . Zoster Vaccines- Shingrix (1 of 2) Never done  . OPHTHALMOLOGY EXAM  10/08/2019    Frederich Balding Halleck does not need a referral for Community Assistance: Care Management:   no Social Work:    no Prescription Assistance:  no Nutrition/Diabetes Education:  no   Plan:  Personalized Goals Goals Addressed            This Visit's Progress   . Patient Stated       01/24/2021 AWV Goal: Exercise for General Health   Patient will verbalize understanding of the benefits of increased physical activity:  Exercising regularly is important. It will improve your overall fitness, flexibility, and endurance.  Regular exercise also will improve your overall health. It can help you control your weight, reduce stress, and  improve your bone density.  Over the next year, patient will increase physical activity as tolerated with a goal of at least 150 minutes of moderate  physical activity per week.   You can tell that you are exercising at a moderate intensity if your heart starts beating faster and you start breathing faster but can still hold a conversation.  Moderate-intensity exercise ideas include:  Walking 1 mile (1.6 km) in about 15 minutes  Biking  Hiking  Golfing  Dancing  Water aerobics  Patient will verbalize understanding of everyday activities that increase physical activity by providing examples like the following: ? Yard work, such as: ? Pushing a Conservation officer, nature ? Raking and bagging leaves ? Washing your car ? Pushing a stroller ? Shoveling snow ? Gardening ? Washing windows or floors  Patient will be able to explain general safety guidelines for exercising:   Before you start a new exercise program, talk with your health care provider.  Do not exercise so much that you hurt yourself, feel dizzy, or get very short of breath.  Wear comfortable clothes and wear shoes with good support.  Drink plenty of water while you exercise to prevent dehydration or heat stroke.  Work out until your breathing and your heartbeat get faster.       Personalized Health Maintenance & Screening Recommendations  Shingles vaccine, Diabetic eye exam  Lung Cancer Screening Recommended: no (Low Dose CT Chest recommended if Age 81-80 years, 30 pack-year currently smoking OR have quit w/in past 15 years) Hepatitis C Screening recommended: no HIV Screening recommended: no  Advanced Directives: Written information was not prepared per patient's request.  Referrals & Orders No orders of the defined types were placed in this encounter.   Follow-up Plan . Follow-up with Sharion Balloon, FNP as planned   I have personally reviewed and noted the following in the patient's chart:   . Medical and  social history . Use of alcohol, tobacco or illicit drugs  . Current medications and supplements . Functional ability and status . Nutritional status . Physical activity . Advanced directives . List of other physicians . Hospitalizations, surgeries, and ER visits in previous 12 months . Vitals . Screenings to include cognitive, depression, and falls . Referrals and appointments  In addition, I have reviewed and discussed with Gabriella Ferguson certain preventive protocols, quality metrics, and best practice recommendations. A written personalized care plan for preventive services as well as general preventive health recommendations is available and can be mailed to the patient at her request.      Christia Reading, LPN  1/50/[id number]

## 2021-01-25 ENCOUNTER — Ambulatory Visit (INDEPENDENT_AMBULATORY_CARE_PROVIDER_SITE_OTHER): Payer: Medicare Other

## 2021-01-25 ENCOUNTER — Other Ambulatory Visit: Payer: Self-pay

## 2021-01-25 DIAGNOSIS — Z7983 Long term (current) use of bisphosphonates: Secondary | ICD-10-CM

## 2021-01-25 DIAGNOSIS — R55 Syncope and collapse: Secondary | ICD-10-CM

## 2021-01-25 DIAGNOSIS — E1165 Type 2 diabetes mellitus with hyperglycemia: Secondary | ICD-10-CM

## 2021-01-25 DIAGNOSIS — E785 Hyperlipidemia, unspecified: Secondary | ICD-10-CM

## 2021-01-25 DIAGNOSIS — N179 Acute kidney failure, unspecified: Secondary | ICD-10-CM | POA: Diagnosis not present

## 2021-01-25 DIAGNOSIS — I129 Hypertensive chronic kidney disease with stage 1 through stage 4 chronic kidney disease, or unspecified chronic kidney disease: Secondary | ICD-10-CM

## 2021-01-25 DIAGNOSIS — N189 Chronic kidney disease, unspecified: Secondary | ICD-10-CM

## 2021-01-25 DIAGNOSIS — Z7984 Long term (current) use of oral hypoglycemic drugs: Secondary | ICD-10-CM

## 2021-01-25 DIAGNOSIS — Z7982 Long term (current) use of aspirin: Secondary | ICD-10-CM

## 2021-01-25 DIAGNOSIS — E1122 Type 2 diabetes mellitus with diabetic chronic kidney disease: Secondary | ICD-10-CM

## 2021-01-25 DIAGNOSIS — Z9181 History of falling: Secondary | ICD-10-CM

## 2021-01-30 ENCOUNTER — Other Ambulatory Visit: Payer: Self-pay | Admitting: Family

## 2021-01-31 ENCOUNTER — Other Ambulatory Visit: Payer: Self-pay | Admitting: Family

## 2021-01-31 DIAGNOSIS — G629 Polyneuropathy, unspecified: Secondary | ICD-10-CM

## 2021-02-02 ENCOUNTER — Other Ambulatory Visit: Payer: Self-pay

## 2021-02-02 ENCOUNTER — Ambulatory Visit (INDEPENDENT_AMBULATORY_CARE_PROVIDER_SITE_OTHER): Payer: Medicare Other | Admitting: Family

## 2021-02-02 ENCOUNTER — Encounter: Payer: Self-pay | Admitting: Family

## 2021-02-02 VITALS — BP 125/48 | HR 61 | Temp 97.7°F | Ht 60.0 in | Wt 160.2 lb

## 2021-02-02 DIAGNOSIS — K5900 Constipation, unspecified: Secondary | ICD-10-CM

## 2021-02-02 DIAGNOSIS — K219 Gastro-esophageal reflux disease without esophagitis: Secondary | ICD-10-CM | POA: Diagnosis not present

## 2021-02-02 DIAGNOSIS — E1169 Type 2 diabetes mellitus with other specified complication: Secondary | ICD-10-CM | POA: Diagnosis not present

## 2021-02-02 DIAGNOSIS — E1165 Type 2 diabetes mellitus with hyperglycemia: Secondary | ICD-10-CM | POA: Diagnosis not present

## 2021-02-02 DIAGNOSIS — E785 Hyperlipidemia, unspecified: Secondary | ICD-10-CM

## 2021-02-02 DIAGNOSIS — I10 Essential (primary) hypertension: Secondary | ICD-10-CM

## 2021-02-02 DIAGNOSIS — Z23 Encounter for immunization: Secondary | ICD-10-CM

## 2021-02-02 LAB — BAYER DCA HB A1C WAIVED: HB A1C (BAYER DCA - WAIVED): 10.4 % — ABNORMAL HIGH (ref <7.0)

## 2021-02-02 MED ORDER — LANTUS SOLOSTAR 100 UNIT/ML ~~LOC~~ SOPN: 18.0000 [IU] | PEN_INJECTOR | Freq: Every day | SUBCUTANEOUS | 3 refills | Status: DC

## 2021-02-02 MED ORDER — CONTOUR NEXT TEST VI STRP: ORAL_STRIP | 11 refills | Status: DC

## 2021-02-02 MED ORDER — TRUEPLUS LANCETS 33G MISC: 11 refills | Status: DC

## 2021-02-02 NOTE — Patient Instructions (Signed)
Diabetes Mellitus and Nutrition, Adult When you have diabetes, or diabetes mellitus, it is very important to have healthy eating habits because your blood sugar (glucose) levels are greatly affected by what you eat and drink. Eating healthy foods in the right amounts, at about the same times every day, can help you:  Control your blood glucose.  Lower your risk of heart disease.  Improve your blood pressure.  Reach or maintain a healthy weight. What can affect my meal plan? Every person with diabetes is different, and each person has different needs for a meal plan. Your health care provider may recommend that you work with a dietitian to make a meal plan that is best for you. Your meal plan may vary depending on factors such as:  The calories you need.  The medicines you take.  Your weight.  Your blood glucose, blood pressure, and cholesterol levels.  Your activity level.  Other health conditions you have, such as heart or kidney disease. How do carbohydrates affect me? Carbohydrates, also called carbs, affect your blood glucose level more than any other type of food. Eating carbs naturally raises the amount of glucose in your blood. Carb counting is a method for keeping track of how many carbs you eat. Counting carbs is important to keep your blood glucose at a healthy level, especially if you use insulin or take certain oral diabetes medicines. It is important to know how many carbs you can safely have in each meal. This is different for every person. Your dietitian can help you calculate how many carbs you should have at each meal and for each snack. How does alcohol affect me? Alcohol can cause a sudden decrease in blood glucose (hypoglycemia), especially if you use insulin or take certain oral diabetes medicines. Hypoglycemia can be a life-threatening condition. Symptoms of hypoglycemia, such as sleepiness, dizziness, and confusion, are similar to symptoms of having too much  alcohol.  Do not drink alcohol if: ? Your health care provider tells you not to drink. ? You are pregnant, may be pregnant, or are planning to become pregnant.  If you drink alcohol: ? Do not drink on an empty stomach. ? Limit how much you use to:  0-1 drink a day for women.  0-2 drinks a day for men. ? Be aware of how much alcohol is in your drink. In the [place] one drink equals one 12 oz bottle of beer (355 mL), one 5 oz glass of wine (148 mL), or one 1 oz glass of hard liquor (44 mL). ? Keep yourself hydrated with water, diet soda, or unsweetened iced tea.  Keep in mind that regular soda, juice, and other mixers may contain a lot of sugar and must be counted as carbs. What are tips for following this plan? Reading food labels  Start by checking the serving size on the "Nutrition Facts" label of packaged foods and drinks. The amount of calories, carbs, fats, and other nutrients listed on the label is based on one serving of the item. Many items contain more than one serving per package.  Check the total grams (g) of carbs in one serving. You can calculate the number of servings of carbs in one serving by dividing the total carbs by 15. For example, if a food has 30 g of total carbs per serving, it would be equal to 2 servings of carbs.  Check the number of grams (g) of saturated fats and trans fats in one serving. Choose foods that have   a low amount or none of these fats.  Check the number of milligrams (mg) of salt (sodium) in one serving. Most people should limit total sodium intake to less than 2,300 mg per day.  Always check the nutrition information of foods labeled as "low-fat" or "nonfat." These foods may be higher in added sugar or refined carbs and should be avoided.  Talk to your dietitian to identify your daily goals for nutrients listed on the label. Shopping  Avoid buying canned, pre-made, or processed foods. These foods tend to be high in fat, sodium, and added  sugar.  Shop around the outside edge of the grocery store. This is where you will most often find fresh fruits and vegetables, bulk grains, fresh meats, and fresh dairy. Cooking  Use low-heat cooking methods, such as baking, instead of high-heat cooking methods like deep frying.  Cook using healthy oils, such as olive, canola, or sunflower oil.  Avoid cooking with butter, cream, or high-fat meats. Meal planning  Eat meals and snacks regularly, preferably at the same times every day. Avoid going long periods of time without eating.  Eat foods that are high in fiber, such as fresh fruits, vegetables, beans, and whole grains. Talk with your dietitian about how many servings of carbs you can eat at each meal.  Eat 4-6 oz (112-168 g) of lean protein each day, such as lean meat, chicken, fish, eggs, or tofu. One ounce (oz) of lean protein is equal to: ? 1 oz (28 g) of meat, chicken, or fish. ? 1 egg. ?  cup (62 g) of tofu.  Eat some foods each day that contain healthy fats, such as avocado, nuts, seeds, and fish.   What foods should I eat? Fruits Berries. Apples. Oranges. Peaches. Apricots. Plums. Grapes. Mango. Papaya. Pomegranate. Kiwi. Cherries. Vegetables Lettuce. Spinach. Leafy greens, including kale, chard, collard greens, and mustard greens. Beets. Cauliflower. Cabbage. Broccoli. Carrots. Green beans. Tomatoes. Peppers. Onions. Cucumbers. Brussels sprouts. Grains Whole grains, such as whole-wheat or whole-grain bread, crackers, tortillas, cereal, and pasta. Unsweetened oatmeal. Quinoa. Brown or wild rice. Meats and other proteins Seafood. Poultry without skin. Lean cuts of poultry and beef. Tofu. Nuts. Seeds. Dairy Low-fat or fat-free dairy products such as milk, yogurt, and cheese. The items listed above may not be a complete list of foods and beverages you can eat. Contact a dietitian for more information. What foods should I avoid? Fruits Fruits canned with  syrup. Vegetables Canned vegetables. Frozen vegetables with butter or cream sauce. Grains Refined white flour and flour products such as bread, pasta, snack foods, and cereals. Avoid all processed foods. Meats and other proteins Fatty cuts of meat. Poultry with skin. Breaded or fried meats. Processed meat. Avoid saturated fats. Dairy Full-fat yogurt, cheese, or milk. Beverages Sweetened drinks, such as soda or iced tea. The items listed above may not be a complete list of foods and beverages you should avoid. Contact a dietitian for more information. Questions to ask a health care provider  Do I need to meet with a diabetes educator?  Do I need to meet with a dietitian?  What number can I call if I have questions?  When are the best times to check my blood glucose? Where to find more information:  American Diabetes Association: diabetes.org  Academy of Nutrition and Dietetics: www.eatright.org  National Institute of Diabetes and Digestive and Kidney Diseases: www.niddk.nih.gov  Association of Diabetes Care and Education Specialists: www.diabeteseducator.org Summary  It is important to have healthy eating   habits because your blood sugar (glucose) levels are greatly affected by what you eat and drink.  A healthy meal plan will help you control your blood glucose and maintain a healthy lifestyle.  Your health care provider may recommend that you work with a dietitian to make a meal plan that is best for you.  Keep in mind that carbohydrates (carbs) and alcohol have immediate effects on your blood glucose levels. It is important to count carbs and to use alcohol carefully. This information is not intended to replace advice given to you by your health care provider. Make sure you discuss any questions you have with your health care provider. Document Revised: 07/21/2019 Document Reviewed: 07/21/2019 Elsevier Patient Education  2021 Elsevier Inc.  

## 2021-02-02 NOTE — Progress Notes (Signed)
Subjective:    Patient ID: Gabriella Ferguson, female    DOB: [birth year], 87 y.o.   MRN: [id number]  Chief Complaint  Patient presents with   Follow-up   Pt presents to the office today for chronic follow up.  Diabetes She presents for her follow-up diabetic visit. She has type 2 diabetes mellitus. There are no hypoglycemic associated symptoms. Pertinent negatives for diabetes include no blurred vision and no foot paresthesias. Symptoms are stable. Diabetic complications include heart disease. Risk factors for coronary artery disease include dyslipidemia, diabetes mellitus, hypertension, sedentary lifestyle and post-menopausal. She is following a generally unhealthy diet. Her overall blood glucose range is 140-180 mg/dl.  Hypertension This is a chronic problem. The current episode started more than 1 year ago. The problem has been resolved since onset. The problem is controlled. Associated symptoms include malaise/fatigue. Pertinent negatives include no blurred vision or peripheral edema. Risk factors for coronary artery disease include dyslipidemia, diabetes mellitus, obesity and sedentary lifestyle. The current treatment provides moderate improvement.  Hyperlipidemia This is a chronic problem. The current episode started more than 1 year ago. The problem is controlled. Recent lipid tests were reviewed and are normal. Current antihyperlipidemic treatment includes statins. The current treatment provides moderate improvement of lipids. Risk factors for coronary artery disease include dyslipidemia, diabetes mellitus, hypertension, post-menopausal and a sedentary lifestyle.  Constipation This is a chronic problem. The current episode started more than 1 year ago. The problem has been waxing and waning since onset. Risk factors include obesity. She has tried laxatives for the symptoms. The treatment provided moderate relief.     Review of Systems  Constitutional:  Positive for malaise/fatigue.   Eyes:  Negative for blurred vision.  Gastrointestinal:  Positive for constipation.  All other systems reviewed and are negative.     Objective:   Physical Exam Vitals reviewed.  Constitutional:      General: She is not in acute distress.    Appearance: She is well-developed.  HENT:     Head: Normocephalic and atraumatic.     Right Ear: Tympanic membrane normal.     Left Ear: Tympanic membrane normal.  Eyes:     Pupils: Pupils are equal, round, and reactive to light.  Neck:     Thyroid: No thyromegaly.  Cardiovascular:     Rate and Rhythm: Normal rate and regular rhythm.     Heart sounds: Normal heart sounds. No murmur heard. Pulmonary:     Effort: Pulmonary effort is normal. No respiratory distress.     Breath sounds: Normal breath sounds. No wheezing.  Abdominal:     General: Bowel sounds are normal. There is no distension.     Palpations: Abdomen is soft.     Tenderness: There is no abdominal tenderness.  Musculoskeletal:        General: No tenderness. Normal range of motion.     Cervical back: Normal range of motion and neck supple.  Skin:    General: Skin is warm and dry.  Neurological:     Mental Status: She is alert and oriented to person, place, and time.     Cranial Nerves: No cranial nerve deficit.     Deep Tendon Reflexes: Reflexes are normal and symmetric.  Psychiatric:        Behavior: Behavior normal.        Thought Content: Thought content normal.        Judgment: Judgment normal.      BP (!) 125/48   Pulse  61   Temp 97.7 F (36.5 C) (Temporal)   Ht 5' (1.524 m)   Wt 160 lb 3.2 oz (72.7 kg)   SpO2 96%   BMI 31.29 kg/m      Assessment & Plan:  Frederich Balding Culp comes in today with chief complaint of Follow-up   Diagnosis and orders addressed:  1. Essential hypertension - CBC with Differential/Platelet - CMP14+EGFR  2. Gastroesophageal reflux disease, unspecified whether esophagitis present - CBC with Differential/Platelet -  CMP14+EGFR  3. Type 2 diabetes mellitus with hyperglycemia, without long-term current use of insulin (HCC) Will increase Lantus to 18 units from 15 units Strict low carb diet - TRUEplus Lancets 33G MISC; Check BS up to 4 times daily as directed Dx E11.9  Dispense: 100 each; Refill: 11 - glucose blood (CONTOUR NEXT TEST) test strip; Check BS up to 4 times daily as directed Dx E11.9  Dispense: 100 strip; Refill: 11 - CBC with Differential/Platelet - CMP14+EGFR - Bayer DCA Hb A1c Waived - LANTUS SOLOSTAR 100 UNIT/ML Solostar Pen; Inject 18 Units into the skin at bedtime.  Dispense: 15 mL; Refill: 3  4. Hyperlipidemia associated with type 2 diabetes mellitus ([place])  - CBC with Differential/Platelet - CMP14+EGFR  5. Constipation, unspecified constipation type - CBC with Differential/Platelet - CMP14+EGFR  6. Need for shingles vaccine - Varicella-zoster vaccine IM (Shingrix) - CBC with Differential/Platelet - CMP14+EGFR   Labs pending Health Maintenance reviewed Diet and exercise encouraged  Follow up plan: 3 months    [name], FNP

## 2021-02-03 LAB — CMP14+EGFR
ALT: 18 IU/L (ref 0–32)
AST: 23 IU/L (ref 0–40)
Albumin/Globulin Ratio: 1.7 (ref 1.2–2.2)
Albumin: 4.2 g/dL (ref 3.6–4.6)
Alkaline Phosphatase: 55 IU/L (ref 44–121)
BUN/Creatinine Ratio: 15 (ref 12–28)
BUN: 16 mg/dL (ref 8–27)
Bilirubin Total: 0.2 mg/dL (ref 0.0–1.2)
CO2: 26 mmol/L (ref 20–29)
Calcium: 9.3 mg/dL (ref 8.7–10.3)
Chloride: 103 mmol/L (ref 96–106)
Creatinine, Ser: 1.04 mg/dL — ABNORMAL HIGH (ref 0.57–1.00)
Globulin, Total: 2.5 g/dL (ref 1.5–4.5)
Glucose: 124 mg/dL — ABNORMAL HIGH (ref 65–99)
Potassium: 4.3 mmol/L (ref 3.5–5.2)
Sodium: 144 mmol/L (ref 134–144)
Total Protein: 6.7 g/dL (ref 6.0–8.5)
eGFR: 51 mL/min/{1.73_m2} — ABNORMAL LOW (ref >59)

## 2021-02-03 LAB — CBC WITH DIFFERENTIAL/PLATELET
Basophils Absolute: 0 10*3/uL (ref 0.0–0.2)
Basos: 0 %
EOS (ABSOLUTE): 0.1 10*3/uL (ref 0.0–0.4)
Eos: 2 %
Hematocrit: 37 % (ref 34.0–46.6)
Hemoglobin: 12.4 g/dL (ref 11.1–15.9)
Immature Grans (Abs): 0 10*3/uL (ref 0.0–0.1)
Immature Granulocytes: 0 %
Lymphocytes Absolute: 3.5 10*3/uL — ABNORMAL HIGH (ref 0.7–3.1)
Lymphs: 45 %
MCH: 28.8 pg (ref 26.6–33.0)
MCHC: 33.5 g/dL (ref 31.5–35.7)
MCV: 86 fL (ref 79–97)
Monocytes Absolute: 0.6 10*3/uL (ref 0.1–0.9)
Monocytes: 8 %
Neutrophils Absolute: 3.6 10*3/uL (ref 1.4–7.0)
Neutrophils: 45 %
Platelets: 244 10*3/uL (ref 150–450)
RBC: 4.3 x10E6/uL (ref 3.77–5.28)
RDW: 12.7 % (ref 11.7–15.4)
WBC: 8 10*3/uL (ref 3.4–10.8)

## 2021-03-03 ENCOUNTER — Other Ambulatory Visit: Payer: Self-pay | Admitting: Family

## 2021-03-03 NOTE — Telephone Encounter (Signed)
Per patient allergy list she is allergic to semaglutide. Please review

## 2021-03-07 ENCOUNTER — Other Ambulatory Visit: Payer: Self-pay | Admitting: Family

## 2021-03-07 DIAGNOSIS — E1165 Type 2 diabetes mellitus with hyperglycemia: Secondary | ICD-10-CM

## 2021-03-07 DIAGNOSIS — I10 Essential (primary) hypertension: Secondary | ICD-10-CM

## 2021-03-07 DIAGNOSIS — E1169 Type 2 diabetes mellitus with other specified complication: Secondary | ICD-10-CM

## 2021-03-08 ENCOUNTER — Other Ambulatory Visit: Payer: Self-pay | Admitting: Family

## 2021-03-08 DIAGNOSIS — I10 Essential (primary) hypertension: Secondary | ICD-10-CM

## 2021-03-08 NOTE — Telephone Encounter (Signed)
I do not see on patients current medication list please review

## 2021-03-16 ENCOUNTER — Ambulatory Visit (INDEPENDENT_AMBULATORY_CARE_PROVIDER_SITE_OTHER): Payer: Medicare Other | Admitting: Family Medicine

## 2021-03-16 ENCOUNTER — Encounter: Payer: Self-pay | Admitting: Family Medicine

## 2021-03-16 DIAGNOSIS — U071 COVID-19: Secondary | ICD-10-CM

## 2021-03-16 MED ORDER — GUAIFENESIN ER 600 MG PO TB12
600.0000 mg | ORAL_TABLET | Freq: Two times a day (BID) | ORAL | 0 refills | Status: DC | PRN
Start: 2021-03-16 — End: 2021-06-29

## 2021-03-16 MED ORDER — AZITHROMYCIN 250 MG PO TABS: ORAL_TABLET | ORAL | 0 refills | Status: AC

## 2021-03-16 NOTE — Progress Notes (Signed)
   Virtual Visit  Note Due to COVID-19 pandemic this visit was conducted virtually. This visit type was conducted due to national recommendations for restrictions regarding the COVID-19 Pandemic (e.g. social distancing, sheltering in place) in an effort to limit this patient's exposure and mitigate transmission in our community. All issues noted in this document were discussed and addressed.  A physical exam was not performed with this format.  I connected with Gabriella Ferguson on 03/16/21 at 1329 by telephone and verified that I am speaking with the correct person using two identifiers. Gabriella Ferguson is currently located at home and no one is currently with her during the visit. The provider, Gwenlyn Perking, FNP is located in their office at time of visit.  I discussed the limitations, risks, security and privacy concerns of performing an evaluation and management service by telephone and the availability of in person appointments. I also discussed with the patient that there may be a patient responsible charge related to this service. The patient expressed understanding and agreed to proceed.  CC: Covid positive  History and Present Illness:  HPI Gabriella Ferguson reports testing positive for Covid with a home test 1 week ago. She reports fatigue and chest congestion. She denies sore throat, cough, fever, vomiting, diarrhea, chest pain, or shortness of breath. She has not tried any medications. She reports that she drinks about 2 cups of fluids a day. Her daughter comes in the afternoons to check on her.     ROS As per HPI.    Observations/Objective: Alert and oriented x 3. Able to speak in full sentences without difficulty.    Assessment and Plan: Gabriella Ferguson was seen today for covid positive.  Diagnoses and all orders for this visit:  COVID Mild symptoms x 1 week. Mucinex, nasal saline for congestion. Zpak ordered. Discussed hydration, rest, quarantine.  -     guaiFENesin (MUCINEX) 600 MG  12 hr tablet; Take 1 tablet (600 mg total) by mouth 2 (two) times daily as needed for cough or to loosen phlegm. -     azithromycin (ZITHROMAX) 250 MG tablet; Take 2 tablets on day 1, then 1 tablet daily on days 2 through 5    Follow Up Instructions: Return to office for new or worsening symptoms, or if symptoms persist.     I discussed the assessment and treatment plan with the patient. The patient was provided an opportunity to ask questions and all were answered. The patient agreed with the plan and demonstrated an understanding of the instructions.   The patient was advised to call back or seek an in-person evaluation if the symptoms worsen or if the condition fails to improve as anticipated.  The above assessment and management plan was discussed with the patient. The patient verbalized understanding of and has agreed to the management plan. Patient is aware to call the clinic if symptoms persist or worsen. Patient is aware when to return to the clinic for a follow-up visit. Patient educated on when it is appropriate to go to the emergency department.   Time call ended:  1350  I provided 11 minutes of  non face-to-face time during this encounter.    Gwenlyn Perking, FNP

## 2021-03-30 ENCOUNTER — Other Ambulatory Visit: Payer: Self-pay

## 2021-03-30 ENCOUNTER — Encounter: Payer: Self-pay | Admitting: Family

## 2021-03-30 ENCOUNTER — Ambulatory Visit (INDEPENDENT_AMBULATORY_CARE_PROVIDER_SITE_OTHER): Payer: Medicare Other | Admitting: Family

## 2021-03-30 VITALS — BP 154/67 | HR 67 | Temp 97.2°F | Ht 60.0 in | Wt 162.8 lb

## 2021-03-30 DIAGNOSIS — E1165 Type 2 diabetes mellitus with hyperglycemia: Secondary | ICD-10-CM | POA: Diagnosis not present

## 2021-03-30 DIAGNOSIS — E785 Hyperlipidemia, unspecified: Secondary | ICD-10-CM

## 2021-03-30 DIAGNOSIS — N390 Urinary tract infection, site not specified: Secondary | ICD-10-CM

## 2021-03-30 DIAGNOSIS — E1169 Type 2 diabetes mellitus with other specified complication: Secondary | ICD-10-CM

## 2021-03-30 DIAGNOSIS — K219 Gastro-esophageal reflux disease without esophagitis: Secondary | ICD-10-CM | POA: Diagnosis not present

## 2021-03-30 DIAGNOSIS — R399 Unspecified symptoms and signs involving the genitourinary system: Secondary | ICD-10-CM

## 2021-03-30 DIAGNOSIS — I10 Essential (primary) hypertension: Secondary | ICD-10-CM | POA: Diagnosis not present

## 2021-03-30 LAB — BAYER DCA HB A1C WAIVED: HB A1C (BAYER DCA - WAIVED): 8 % — ABNORMAL HIGH (ref <7.0)

## 2021-03-30 MED ORDER — AMOXICILLIN-POT CLAVULANATE 875-125 MG PO TABS: 1.0000 | ORAL_TABLET | Freq: Two times a day (BID) | ORAL | 0 refills | Status: DC

## 2021-03-30 NOTE — Patient Instructions (Signed)
Urinary Tract Infection, Adult A urinary tract infection (UTI) is an infection of any part of the urinary tract. The urinary tract includes the kidneys, ureters, bladder, and urethra. These organs make, store, and get rid of urine in the body. An upper UTI affects the ureters and kidneys. A lower UTI affects the bladder and urethra. What are the causes? Most urinary tract infections are caused by bacteria in your genital area around your urethra, where urine leaves your body. These bacteria grow and cause inflammation of your urinary tract. What increases the risk? You are more likely to develop this condition if: You have a urinary catheter that stays in place. You are not able to control when you urinate or have a bowel movement (incontinence). You are female and you: Use a spermicide or diaphragm for birth control. Have low estrogen levels. Are pregnant. You have certain genes that increase your risk. You are sexually active. You take antibiotic medicines. You have a condition that causes your flow of urine to slow down, such as: An enlarged prostate, if you are female. Blockage in your urethra. A kidney stone. A nerve condition that affects your bladder control (neurogenic bladder). Not getting enough to drink, or not urinating often. You have certain medical conditions, such as: Diabetes. A weak disease-fighting system (immunesystem). Sickle cell disease. Gout. Spinal cord injury. What are the signs or symptoms? Symptoms of this condition include: Needing to urinate right away (urgency). Frequent urination. This may include small amounts of urine each time you urinate. Pain or burning with urination. Blood in the urine. Urine that smells bad or unusual. Trouble urinating. Cloudy urine. Vaginal discharge, if you are female. Pain in the abdomen or the lower back. You may also have: Vomiting or a decreased appetite. Confusion. Irritability or tiredness. A fever or  chills. Diarrhea. The first symptom in older adults may be confusion. In some cases, they may not have any symptoms until the infection has worsened. How is this diagnosed? This condition is diagnosed based on your medical history and a physical exam. You may also have other tests, including: Urine tests. Blood tests. Tests for STIs (sexually transmitted infections). If you have had more than one UTI, a cystoscopy or imaging studies may be done to determine the cause of the infections. How is this treated? Treatment for this condition includes: Antibiotic medicine. Over-the-counter medicines to treat discomfort. Drinking enough water to stay hydrated. If you have frequent infections or have other conditions such as a kidney stone, you may need to see a health care provider who specializes in the urinary tract (urologist). In rare cases, urinary tract infections can cause sepsis. Sepsis is a life-threatening condition that occurs when the body responds to an infection. Sepsis is treated in the hospital with IV antibiotics, fluids, and other medicines. Follow these instructions at home: Medicines Take over-the-counter and prescription medicines only as told by your health care provider. If you were prescribed an antibiotic medicine, take it as told by your health care provider. Do not stop using the antibiotic even if you start to feel better. General instructions Make sure you: Empty your bladder often and completely. Do not hold urine for long periods of time. Empty your bladder after sex. Wipe from front to back after urinating or having a bowel movement if you are female. Use each tissue only one time when you wipe. Drink enough fluid to keep your urine pale yellow. Keep all follow-up visits. This is important. Contact a health care provider   if: Your symptoms do not get better after 1-2 days. Your symptoms go away and then return. Get help right away if: You have severe pain in your  back or your lower abdomen. You have a fever or chills. You have nausea or vomiting. Summary A urinary tract infection (UTI) is an infection of any part of the urinary tract, which includes the kidneys, ureters, bladder, and urethra. Most urinary tract infections are caused by bacteria in your genital area. Treatment for this condition often includes antibiotic medicines. If you were prescribed an antibiotic medicine, take it as told by your health care provider. Do not stop using the antibiotic even if you start to feel better. Keep all follow-up visits. This is important. This information is not intended to replace advice given to you by your health care provider. Make sure you discuss any questions you have with your health care provider. Document Revised: 03/25/2020 Document Reviewed: 03/25/2020 Elsevier Patient Education  2022 [employer] Inc.  

## 2021-03-30 NOTE — Progress Notes (Signed)
Subjective:    Patient ID: Gabriella Ferguson, female    DOB: [birth year], 87 y.o.   MRN: [id number]  Chief Complaint  Patient presents with   Diabetes   Urinary Tract Infection   Pt presents to the office today for chronic follow up.  Diabetes She presents for her follow-up diabetic visit. She has type 2 diabetes mellitus. There are no hypoglycemic associated symptoms. Pertinent negatives for diabetes include no blurred vision and no foot paresthesias. Diabetic complications include heart disease, nephropathy and peripheral neuropathy. Risk factors for coronary artery disease include dyslipidemia, diabetes mellitus, hypertension, sedentary lifestyle and post-menopausal. She is following a generally unhealthy diet. Her overall blood glucose range is 110-130 mg/dl. Eye exam is current.  Urinary Tract Infection  This is a new problem. The current episode started in the past 7 days. The problem occurs every urination. The problem has been waxing and waning. The pain is mild. Associated symptoms include frequency, nausea and urgency. Pertinent negatives include no hematuria.  Hypertension This is a chronic problem. The current episode started more than 1 year ago. The problem has been waxing and waning since onset. Associated symptoms include malaise/fatigue. Pertinent negatives include no blurred vision, peripheral edema or shortness of breath. Risk factors for coronary artery disease include dyslipidemia and obesity. The current treatment provides moderate improvement.  Gastroesophageal Reflux She complains of belching, heartburn and nausea. This is a chronic problem. The current episode started more than 1 year ago. The problem occurs occasionally. Risk factors include obesity. She has tried a PPI for the symptoms.  Hyperlipidemia This is a chronic problem. The current episode started more than 1 year ago. Exacerbating diseases include obesity. Pertinent negatives include no shortness of breath.  Current antihyperlipidemic treatment includes statins. The current treatment provides moderate improvement of lipids.     Review of Systems  Constitutional:  Positive for malaise/fatigue.  Eyes:  Negative for blurred vision.  Respiratory:  Negative for shortness of breath.   Gastrointestinal:  Positive for heartburn and nausea.  Genitourinary:  Positive for frequency and urgency. Negative for hematuria.  All other systems reviewed and are negative.     Objective:   Physical Exam Vitals reviewed.  Constitutional:      General: She is not in acute distress.    Appearance: She is well-developed.  HENT:     Head: Normocephalic and atraumatic.     Right Ear: Tympanic membrane normal.     Left Ear: Tympanic membrane normal.  Eyes:     Pupils: Pupils are equal, round, and reactive to light.  Neck:     Thyroid: No thyromegaly.  Cardiovascular:     Rate and Rhythm: Normal rate and regular rhythm.     Heart sounds: Normal heart sounds. No murmur heard. Pulmonary:     Effort: Pulmonary effort is normal. No respiratory distress.     Breath sounds: Normal breath sounds. No wheezing.  Abdominal:     General: Bowel sounds are normal. There is no distension.     Palpations: Abdomen is soft.     Tenderness: There is no abdominal tenderness.  Musculoskeletal:        General: No tenderness. Normal range of motion.     Cervical back: Normal range of motion and neck supple.  Skin:    General: Skin is warm and dry.  Neurological:     Mental Status: She is alert and oriented to person, place, and time.     Cranial Nerves: No cranial nerve  deficit.     Motor: Weakness present.     Gait: Gait abnormal (using cane to walk, weakness).     Deep Tendon Reflexes: Reflexes are normal and symmetric.  Psychiatric:        Behavior: Behavior normal.        Thought Content: Thought content normal.        Judgment: Judgment normal.      BP (!) 154/67   Pulse 67   Temp (!) 97.2 F (36.2 C)  (Temporal)   Ht 5' (1.524 m)   Wt 162 lb 12.8 oz (73.8 kg)   SpO2 94%   BMI 31.79 kg/m      Assessment & Plan:  Gabriella Ferguson comes in today with chief complaint of Diabetes and Urinary Tract Infection   Diagnosis and orders addressed:  1. Type 2 diabetes mellitus with hyperglycemia, without long-term current use of insulin (HCC) - Bayer DCA Hb A1c Waived - CMP14+EGFR  2. Urinary tract infection without hematuria, site unspecified - Urine Culture - Urinalysis, Complete - CMP14+EGFR  3. Essential hypertension - CMP14+EGFR  4. Gastroesophageal reflux disease, unspecified whether esophagitis present - CMP14+EGFR  5. Hyperlipidemia associated with type 2 diabetes mellitus (HCC) - CMP14+EGFR  6. UTI symptoms - CMP14+EGFR - amoxicillin-clavulanate (AUGMENTIN) 875-125 MG tablet; Take 1 tablet by mouth 2 (two) times daily.  Dispense: 14 tablet; Refill: 0   Labs pending Health Maintenance reviewed Diet and exercise encouraged  Follow up plan: 3 months    [name], FNP

## 2021-03-31 ENCOUNTER — Other Ambulatory Visit: Payer: Medicare Other

## 2021-03-31 DIAGNOSIS — N390 Urinary tract infection, site not specified: Secondary | ICD-10-CM | POA: Diagnosis not present

## 2021-03-31 LAB — CMP14+EGFR
ALT: 16 IU/L (ref 0–32)
AST: 22 IU/L (ref 0–40)
Albumin/Globulin Ratio: 1.4 (ref 1.2–2.2)
Albumin: 3.8 g/dL (ref 3.5–4.6)
Alkaline Phosphatase: 58 IU/L (ref 44–121)
BUN/Creatinine Ratio: 14 (ref 12–28)
BUN: 16 mg/dL (ref 10–36)
Bilirubin Total: <0.2 mg/dL (ref 0.0–1.2)
CO2: 25 mmol/L (ref 20–29)
Calcium: 9.8 mg/dL (ref 8.7–10.3)
Chloride: 104 mmol/L (ref 96–106)
Creatinine, Ser: 1.17 mg/dL — ABNORMAL HIGH (ref 0.57–1.00)
Globulin, Total: 2.8 g/dL (ref 1.5–4.5)
Glucose: 127 mg/dL — ABNORMAL HIGH (ref 65–99)
Potassium: 4.5 mmol/L (ref 3.5–5.2)
Sodium: 143 mmol/L (ref 134–144)
Total Protein: 6.6 g/dL (ref 6.0–8.5)
eGFR: 44 mL/min/{1.73_m2} — ABNORMAL LOW (ref >59)

## 2021-03-31 LAB — URINALYSIS, COMPLETE
Bilirubin, UA: NEGATIVE
Glucose, UA: NEGATIVE
Nitrite, UA: NEGATIVE
Specific Gravity, UA: 1.02 (ref 1.005–1.030)
Urobilinogen, Ur: 0.2 mg/dL (ref 0.2–1.0)
pH, UA: 5 (ref 5.0–7.5)

## 2021-03-31 LAB — MICROSCOPIC EXAMINATION: Renal Epithel, UA: NONE SEEN /hpf

## 2021-04-02 LAB — URINE CULTURE

## 2021-04-03 ENCOUNTER — Other Ambulatory Visit: Payer: Self-pay | Admitting: Family

## 2021-04-03 DIAGNOSIS — N390 Urinary tract infection, site not specified: Secondary | ICD-10-CM

## 2021-04-07 ENCOUNTER — Other Ambulatory Visit: Payer: Self-pay

## 2021-04-07 ENCOUNTER — Ambulatory Visit (INDEPENDENT_AMBULATORY_CARE_PROVIDER_SITE_OTHER): Payer: Medicare Other

## 2021-04-07 DIAGNOSIS — E1165 Type 2 diabetes mellitus with hyperglycemia: Secondary | ICD-10-CM

## 2021-04-07 DIAGNOSIS — N189 Chronic kidney disease, unspecified: Secondary | ICD-10-CM

## 2021-04-07 DIAGNOSIS — I129 Hypertensive chronic kidney disease with stage 1 through stage 4 chronic kidney disease, or unspecified chronic kidney disease: Secondary | ICD-10-CM | POA: Diagnosis not present

## 2021-04-07 DIAGNOSIS — E1122 Type 2 diabetes mellitus with diabetic chronic kidney disease: Secondary | ICD-10-CM

## 2021-04-07 DIAGNOSIS — E785 Hyperlipidemia, unspecified: Secondary | ICD-10-CM

## 2021-04-07 DIAGNOSIS — Z9181 History of falling: Secondary | ICD-10-CM

## 2021-04-07 DIAGNOSIS — Z7982 Long term (current) use of aspirin: Secondary | ICD-10-CM

## 2021-04-07 DIAGNOSIS — Z7983 Long term (current) use of bisphosphonates: Secondary | ICD-10-CM

## 2021-04-07 DIAGNOSIS — N179 Acute kidney failure, unspecified: Secondary | ICD-10-CM | POA: Diagnosis not present

## 2021-04-07 DIAGNOSIS — R55 Syncope and collapse: Secondary | ICD-10-CM

## 2021-04-07 DIAGNOSIS — Z794 Long term (current) use of insulin: Secondary | ICD-10-CM

## 2021-04-07 DIAGNOSIS — Z7984 Long term (current) use of oral hypoglycemic drugs: Secondary | ICD-10-CM

## 2021-04-10 ENCOUNTER — Other Ambulatory Visit: Payer: Self-pay | Admitting: Family

## 2021-04-10 DIAGNOSIS — R350 Frequency of micturition: Secondary | ICD-10-CM

## 2021-04-10 DIAGNOSIS — M810 Age-related osteoporosis without current pathological fracture: Secondary | ICD-10-CM

## 2021-05-02 ENCOUNTER — Other Ambulatory Visit: Payer: Self-pay | Admitting: Family

## 2021-05-02 DIAGNOSIS — G629 Polyneuropathy, unspecified: Secondary | ICD-10-CM

## 2021-06-05 ENCOUNTER — Ambulatory Visit: Payer: Self-pay | Admitting: Urology

## 2021-06-08 ENCOUNTER — Other Ambulatory Visit: Payer: Self-pay | Admitting: Family

## 2021-06-08 DIAGNOSIS — E1165 Type 2 diabetes mellitus with hyperglycemia: Secondary | ICD-10-CM

## 2021-06-08 DIAGNOSIS — E1169 Type 2 diabetes mellitus with other specified complication: Secondary | ICD-10-CM

## 2021-06-29 ENCOUNTER — Other Ambulatory Visit: Payer: Self-pay | Admitting: Family

## 2021-06-29 ENCOUNTER — Ambulatory Visit (INDEPENDENT_AMBULATORY_CARE_PROVIDER_SITE_OTHER): Payer: Medicare Other | Admitting: Family

## 2021-06-29 ENCOUNTER — Other Ambulatory Visit: Payer: Self-pay

## 2021-06-29 ENCOUNTER — Encounter: Payer: Self-pay | Admitting: Family

## 2021-06-29 VITALS — BP 150/64 | HR 64 | Temp 97.2°F | Ht 60.0 in | Wt 164.6 lb

## 2021-06-29 DIAGNOSIS — I10 Essential (primary) hypertension: Secondary | ICD-10-CM

## 2021-06-29 DIAGNOSIS — E1165 Type 2 diabetes mellitus with hyperglycemia: Secondary | ICD-10-CM

## 2021-06-29 DIAGNOSIS — N3281 Overactive bladder: Secondary | ICD-10-CM | POA: Diagnosis not present

## 2021-06-29 DIAGNOSIS — K219 Gastro-esophageal reflux disease without esophagitis: Secondary | ICD-10-CM | POA: Diagnosis not present

## 2021-06-29 DIAGNOSIS — E1169 Type 2 diabetes mellitus with other specified complication: Secondary | ICD-10-CM

## 2021-06-29 DIAGNOSIS — N390 Urinary tract infection, site not specified: Secondary | ICD-10-CM

## 2021-06-29 DIAGNOSIS — E785 Hyperlipidemia, unspecified: Secondary | ICD-10-CM

## 2021-06-29 LAB — URINALYSIS, COMPLETE
Bilirubin, UA: NEGATIVE
Glucose, UA: NEGATIVE
Ketones, UA: NEGATIVE
Nitrite, UA: NEGATIVE
Specific Gravity, UA: 1.02 (ref 1.005–1.030)
Urobilinogen, Ur: 0.2 mg/dL (ref 0.2–1.0)
pH, UA: 5.5 (ref 5.0–7.5)

## 2021-06-29 LAB — MICROSCOPIC EXAMINATION
Renal Epithel, UA: NONE SEEN /hpf
WBC, UA: >30 /hpf — AB (ref 0–5)

## 2021-06-29 LAB — BAYER DCA HB A1C WAIVED: HB A1C (BAYER DCA - WAIVED): 7.2 % — ABNORMAL HIGH (ref 4.8–5.6)

## 2021-06-29 MED ORDER — MIRABEGRON ER 50 MG PO TB24: 50.0000 mg | ORAL_TABLET | Freq: Every day | ORAL | 1 refills | Status: DC

## 2021-06-29 NOTE — Progress Notes (Signed)
Subjective:    Patient ID: Gabriella Ferguson, female    DOB: [birth year], 87 y.o.   MRN: [id number]  Chief Complaint  Patient presents with   Medical Management of Chronic Issues   Pt presents to the office today for chronic follow up Hypertension This is a chronic problem. The current episode started more than 1 year ago. The problem has been waxing and waning since onset. The problem is uncontrolled. Associated symptoms include malaise/fatigue, peripheral edema and shortness of breath. Pertinent negatives include no blurred vision. Risk factors for coronary artery disease include dyslipidemia, diabetes mellitus, obesity and sedentary lifestyle. The current treatment provides moderate improvement.  Diabetes She presents for her follow-up diabetic visit. She has type 2 diabetes mellitus. Pertinent negatives for diabetes include no blurred vision. Symptoms are stable. Diabetic complications include heart disease. Risk factors for coronary artery disease include diabetes mellitus, dyslipidemia, hypertension, sedentary lifestyle and post-menopausal. She is following a generally healthy diet. Her overall blood glucose range is 130-140 mg/dl.  Gastroesophageal Reflux She complains of belching and heartburn. She reports no abdominal pain. This is a chronic problem. The problem occurs rarely. She has tried an antacid for the symptoms. The treatment provided moderate relief.  Hyperlipidemia This is a chronic problem. The current episode started more than 1 year ago. Exacerbating diseases include obesity. Associated symptoms include shortness of breath. Current antihyperlipidemic treatment includes statins. The current treatment provides moderate improvement of lipids. Risk factors for coronary artery disease include dyslipidemia, diabetes mellitus, hypertension, post-menopausal and a sedentary lifestyle.     Review of Systems  Constitutional:  Positive for malaise/fatigue.  Eyes:  Negative for blurred  vision.  Respiratory:  Positive for shortness of breath.   Gastrointestinal:  Positive for heartburn. Negative for abdominal pain.  All other systems reviewed and are negative.     Objective:   Physical Exam Vitals reviewed.  Constitutional:      General: She is not in acute distress.    Appearance: She is well-developed. She is obese.  HENT:     Head: Normocephalic and atraumatic.     Right Ear: Tympanic membrane normal.     Left Ear: Tympanic membrane normal.  Eyes:     Pupils: Pupils are equal, round, and reactive to light.  Neck:     Thyroid: No thyromegaly.  Cardiovascular:     Rate and Rhythm: Normal rate and regular rhythm.     Heart sounds: Normal heart sounds. No murmur heard. Pulmonary:     Effort: Pulmonary effort is normal. No respiratory distress.     Breath sounds: Normal breath sounds. No wheezing.  Abdominal:     General: Bowel sounds are normal. There is no distension.     Palpations: Abdomen is soft.     Tenderness: There is no abdominal tenderness.  Musculoskeletal:        General: No tenderness or deformity. Normal range of motion.     Cervical back: Normal range of motion and neck supple.     Right lower leg: Edema (2) present.     Left lower leg: Edema (2+) present.  Skin:    General: Skin is warm and dry.  Neurological:     Mental Status: She is alert and oriented to person, place, and time.     Cranial Nerves: No cranial nerve deficit.     Deep Tendon Reflexes: Reflexes are normal and symmetric.  Psychiatric:        Behavior: Behavior normal.  Thought Content: Thought content normal.        Judgment: Judgment normal.      BP (!) 150/64   Pulse 64   Temp (!) 97.2 F (36.2 C) (Temporal)   Ht 5' (1.524 m)   Wt 164 lb 9.6 oz (74.7 kg)   BMI 32.15 kg/m      Assessment & Plan:  Gabriella Ferguson comes in today with chief complaint of Medical Management of Chronic Issues   Diagnosis and orders addressed:  1. Essential  hypertension  2. Gastroesophageal reflux disease, unspecified whether esophagitis present  3. Type 2 diabetes mellitus with hyperglycemia, without long-term current use of insulin (HCC) - CMP14+EGFR - Bayer DCA Hb A1c Waived  4. Hyperlipidemia associated with type 2 diabetes mellitus ([place])  5. Recurrent UTI - Urinalysis, Complete - Urine Culture - Ambulatory referral to Urology  6. OAB (overactive bladder) - Will increase Myrbetriq to 50 mg from 25 mg  Avoid caffeine  - mirabegron ER (MYRBETRIQ) 50 MG TB24 tablet; Take 1 tablet (50 mg total) by mouth daily.  Dispense: 90 tablet; Refill: 1 - Urinalysis, Complete - Urine Culture - Ambulatory referral to Urology   Labs pending Health Maintenance reviewed Diet and exercise encouraged  Follow up plan: 3 month    [name], FNP

## 2021-06-29 NOTE — Patient Instructions (Signed)

## 2021-06-30 ENCOUNTER — Encounter: Payer: Self-pay | Admitting: *Deleted

## 2021-06-30 LAB — CMP14+EGFR
ALT: 14 IU/L (ref 0–32)
AST: 18 IU/L (ref 0–40)
Albumin/Globulin Ratio: 1.4 (ref 1.2–2.2)
Albumin: 3.8 g/dL (ref 3.5–4.6)
Alkaline Phosphatase: 59 IU/L (ref 44–121)
BUN/Creatinine Ratio: 12 (ref 12–28)
BUN: 13 mg/dL (ref 10–36)
Bilirubin Total: <0.2 mg/dL (ref 0.0–1.2)
CO2: 28 mmol/L (ref 20–29)
Calcium: 9.4 mg/dL (ref 8.7–10.3)
Chloride: 104 mmol/L (ref 96–106)
Creatinine, Ser: 1.08 mg/dL — ABNORMAL HIGH (ref 0.57–1.00)
Globulin, Total: 2.8 g/dL (ref 1.5–4.5)
Glucose: 120 mg/dL — ABNORMAL HIGH (ref 70–99)
Potassium: 4.8 mmol/L (ref 3.5–5.2)
Sodium: 143 mmol/L (ref 134–144)
Total Protein: 6.6 g/dL (ref 6.0–8.5)
eGFR: 49 mL/min/{1.73_m2} — ABNORMAL LOW (ref >59)

## 2021-07-03 ENCOUNTER — Other Ambulatory Visit: Payer: Self-pay | Admitting: Family

## 2021-07-03 MED ORDER — CEPHALEXIN 500 MG PO CAPS: 500.0000 mg | ORAL_CAPSULE | Freq: Two times a day (BID) | ORAL | 0 refills | Status: DC

## 2021-07-03 MED ORDER — CEPHALEXIN 250 MG PO CAPS: 250.0000 mg | ORAL_CAPSULE | Freq: Every day | ORAL | 1 refills | Status: AC

## 2021-07-10 LAB — URINE CULTURE

## 2021-07-11 ENCOUNTER — Other Ambulatory Visit: Payer: Self-pay | Admitting: Family

## 2021-07-11 DIAGNOSIS — R350 Frequency of micturition: Secondary | ICD-10-CM

## 2021-07-11 MED ORDER — DOXYCYCLINE HYCLATE 100 MG PO TABS: 100.0000 mg | ORAL_TABLET | Freq: Two times a day (BID) | ORAL | 0 refills | Status: DC

## 2021-07-12 ENCOUNTER — Other Ambulatory Visit: Payer: Self-pay | Admitting: Family

## 2021-07-12 DIAGNOSIS — M810 Age-related osteoporosis without current pathological fracture: Secondary | ICD-10-CM

## 2021-08-02 ENCOUNTER — Other Ambulatory Visit: Payer: Self-pay | Admitting: Family

## 2021-08-02 DIAGNOSIS — G629 Polyneuropathy, unspecified: Secondary | ICD-10-CM

## 2021-09-09 ENCOUNTER — Other Ambulatory Visit: Payer: Self-pay | Admitting: Family

## 2021-09-09 DIAGNOSIS — E1165 Type 2 diabetes mellitus with hyperglycemia: Secondary | ICD-10-CM

## 2021-09-09 DIAGNOSIS — E785 Hyperlipidemia, unspecified: Secondary | ICD-10-CM

## 2021-09-09 DIAGNOSIS — E1169 Type 2 diabetes mellitus with other specified complication: Secondary | ICD-10-CM

## 2021-09-25 ENCOUNTER — Other Ambulatory Visit: Payer: Self-pay | Admitting: Family

## 2021-09-25 DIAGNOSIS — M810 Age-related osteoporosis without current pathological fracture: Secondary | ICD-10-CM

## 2021-10-02 ENCOUNTER — Ambulatory Visit (INDEPENDENT_AMBULATORY_CARE_PROVIDER_SITE_OTHER): Payer: Medicare Other | Admitting: Family

## 2021-10-02 ENCOUNTER — Encounter: Payer: Self-pay | Admitting: Family

## 2021-10-02 VITALS — BP 136/61 | HR 71 | Temp 98.9°F | Ht 60.0 in | Wt 169.2 lb

## 2021-10-02 DIAGNOSIS — K5900 Constipation, unspecified: Secondary | ICD-10-CM

## 2021-10-02 DIAGNOSIS — E1165 Type 2 diabetes mellitus with hyperglycemia: Secondary | ICD-10-CM

## 2021-10-02 DIAGNOSIS — N390 Urinary tract infection, site not specified: Secondary | ICD-10-CM

## 2021-10-02 DIAGNOSIS — K219 Gastro-esophageal reflux disease without esophagitis: Secondary | ICD-10-CM | POA: Diagnosis not present

## 2021-10-02 DIAGNOSIS — I10 Essential (primary) hypertension: Secondary | ICD-10-CM

## 2021-10-02 DIAGNOSIS — E1169 Type 2 diabetes mellitus with other specified complication: Secondary | ICD-10-CM

## 2021-10-02 DIAGNOSIS — E785 Hyperlipidemia, unspecified: Secondary | ICD-10-CM

## 2021-10-02 DIAGNOSIS — Z23 Encounter for immunization: Secondary | ICD-10-CM

## 2021-10-02 LAB — BAYER DCA HB A1C WAIVED: HB A1C (BAYER DCA - WAIVED): 7.7 % — ABNORMAL HIGH (ref 4.8–5.6)

## 2021-10-02 NOTE — Addendum Note (Signed)
Addended by: Ladean Raya on: 10/02/2021 03:04 PM   Modules accepted: Orders

## 2021-10-02 NOTE — Progress Notes (Signed)
° °Subjective:  ° ° Patient ID: Gabriella Ferguson, female    DOB: 06/08/1929, 88 y.o.   MRN: [id number] ° °Chief Complaint  °Patient presents with  ° Medical Management of Chronic Issues  °  Still have kidney problems  ° °Pt presents to the office today for chronic follow up °Hypertension °This is a chronic problem. The current episode started more than 1 year ago. The problem has been waxing and waning since onset. The problem is uncontrolled. Pertinent negatives include no blurred vision, malaise/fatigue, peripheral edema or shortness of breath. Risk factors for coronary artery disease include dyslipidemia, diabetes mellitus, obesity and sedentary lifestyle. The current treatment provides moderate improvement.  °Gastroesophageal Reflux °She complains of belching and heartburn. This is a chronic problem. The current episode started more than 1 year ago. The problem occurs occasionally. The problem has been waxing and waning. Risk factors include obesity. She has tried a PPI for the symptoms. The treatment provided moderate relief.  °Hyperlipidemia °The current episode started more than 1 year ago. The problem is controlled. Recent lipid tests were reviewed and are normal. Exacerbating diseases include obesity. Pertinent negatives include no shortness of breath. Current antihyperlipidemic treatment includes statins. The current treatment provides moderate improvement of lipids. Risk factors for coronary artery disease include dyslipidemia, diabetes mellitus, hypertension, a sedentary lifestyle and post-menopausal.  °Diabetes °She presents for her follow-up diabetic visit. She has type 2 diabetes mellitus. Associated symptoms include foot paresthesias. Pertinent negatives for diabetes include no blurred vision. Symptoms are stable. Diabetic complications include heart disease. Risk factors for coronary artery disease include dyslipidemia, hypertension, diabetes mellitus, sedentary lifestyle and post-menopausal. She is  following a generally healthy diet. Her overall blood glucose range is 110-130 mg/dl.  °Constipation °This is a chronic problem. The current episode started more than 1 year ago. The problem has been resolved since onset. Her stool frequency is 2 to 3 times per week. Risk factors include obesity. She has tried laxatives for the symptoms. The treatment provided moderate relief.  °Urinary Frequency  °This is a chronic problem. The current episode started more than 1 year ago. The problem has been waxing and waning. The patient is experiencing no pain. Associated symptoms include frequency and urgency. Pertinent negatives include no hesitancy.  ° ° ° °Review of Systems  °Constitutional:  Negative for malaise/fatigue.  °Eyes:  Negative for blurred vision.  °Respiratory:  Negative for shortness of breath.   °Gastrointestinal:  Positive for constipation and heartburn.  °Genitourinary:  Positive for frequency and urgency. Negative for hesitancy.  °All other systems reviewed and are negative. ° °   °Objective:  ° Physical Exam °Vitals reviewed.  °Constitutional:   °   General: She is not in acute distress. °   Appearance: She is well-developed. She is obese.  °HENT:  °   Head: Normocephalic and atraumatic.  °   Right Ear: Tympanic membrane normal.  °   Left Ear: Tympanic membrane normal.  °Eyes:  °   Pupils: Pupils are equal, round, and reactive to light.  °Neck:  °   Thyroid: No thyromegaly.  °Cardiovascular:  °   Rate and Rhythm: Normal rate and regular rhythm.  °   Heart sounds: Normal heart sounds. No murmur heard. °Pulmonary:  °   Effort: Pulmonary effort is normal. No respiratory distress.  °   Breath sounds: Normal breath sounds. No wheezing.  °Abdominal:  °   General: Bowel sounds are normal. There is no distension.  °     Palpations: Abdomen is soft.  °   Tenderness: There is no abdominal tenderness.  °Musculoskeletal:     °   General: No tenderness. Normal range of motion.  °   Cervical back: Normal range of motion  and neck supple.  °   Right lower leg: Edema (trace) present.  °   Left lower leg: Edema (trace) present.  °Skin: °   General: Skin is warm and dry.  °Neurological:  °   Mental Status: She is alert and oriented to person, place, and time.  °   Cranial Nerves: No cranial nerve deficit.  °   Motor: Weakness (using cane to walk) present.  °   Deep Tendon Reflexes: Reflexes are normal and symmetric.  °Psychiatric:     °   Behavior: Behavior normal.     °   Thought Content: Thought content normal.     °   Judgment: Judgment normal.  ° ° ° ° °BP (!) 146/59    Pulse 71    Temp 98.9 °F (37.2 °C)    SpO2 98%  ° °   °Assessment & Plan:  °Gabriella Ferguson comes in today with chief complaint of Medical Management of Chronic Issues (Still have kidney problems) ° ° °Diagnosis and orders addressed: ° °1. Essential hypertension °- CMP14+EGFR °- CBC with Differential/Platelet ° °2. Gastroesophageal reflux disease, unspecified whether esophagitis present °- CMP14+EGFR °- CBC with Differential/Platelet ° °3. Hyperlipidemia associated with type 2 diabetes mellitus (HCC) °- CMP14+EGFR °- CBC with Differential/Platelet ° °4. Type 2 diabetes mellitus with hyperglycemia, without long-term current use of insulin (HCC) °- CMP14+EGFR °- CBC with Differential/Platelet °- Bayer DCA Hb A1c Waived ° °5. Constipation, unspecified constipation type ° °- CMP14+EGFR °- CBC with Differential/Platelet ° °6. Recurrent UTI °- CMP14+EGFR °- CBC with Differential/Platelet ° ° °Labs pending °Health Maintenance reviewed °Diet and exercise encouraged ° °Follow up plan: °3 months  ° ° °Christy Hawks, FNP ° ° ° °

## 2021-10-02 NOTE — Patient Instructions (Signed)

## 2021-10-03 LAB — CMP14+EGFR
ALT: 12 IU/L (ref 0–32)
AST: 18 IU/L (ref 0–40)
Albumin/Globulin Ratio: 1.3 (ref 1.2–2.2)
Albumin: 3.8 g/dL (ref 3.5–4.6)
Alkaline Phosphatase: 59 IU/L (ref 44–121)
BUN/Creatinine Ratio: 16 (ref 12–28)
BUN: 17 mg/dL (ref 10–36)
Bilirubin Total: <0.2 mg/dL (ref 0.0–1.2)
CO2: 26 mmol/L (ref 20–29)
Calcium: 9.8 mg/dL (ref 8.7–10.3)
Chloride: 102 mmol/L (ref 96–106)
Creatinine, Ser: 1.07 mg/dL — ABNORMAL HIGH (ref 0.57–1.00)
Globulin, Total: 2.9 g/dL (ref 1.5–4.5)
Glucose: 102 mg/dL — ABNORMAL HIGH (ref 70–99)
Potassium: 4.8 mmol/L (ref 3.5–5.2)
Sodium: 141 mmol/L (ref 134–144)
Total Protein: 6.7 g/dL (ref 6.0–8.5)
eGFR: 49 mL/min/{1.73_m2} — ABNORMAL LOW (ref >59)

## 2021-10-03 LAB — CBC WITH DIFFERENTIAL/PLATELET
Basophils Absolute: 0.1 10*3/uL (ref 0.0–0.2)
Basos: 1 %
EOS (ABSOLUTE): 0.2 10*3/uL (ref 0.0–0.4)
Eos: 3 %
Hematocrit: 36.8 % (ref 34.0–46.6)
Hemoglobin: 12 g/dL (ref 11.1–15.9)
Immature Grans (Abs): 0 10*3/uL (ref 0.0–0.1)
Immature Granulocytes: 0 %
Lymphocytes Absolute: 2.7 10*3/uL (ref 0.7–3.1)
Lymphs: 35 %
MCH: 28.2 pg (ref 26.6–33.0)
MCHC: 32.6 g/dL (ref 31.5–35.7)
MCV: 86 fL (ref 79–97)
Monocytes Absolute: 0.8 10*3/uL (ref 0.1–0.9)
Monocytes: 10 %
Neutrophils Absolute: 3.9 10*3/uL (ref 1.4–7.0)
Neutrophils: 51 %
Platelets: 262 10*3/uL (ref 150–450)
RBC: 4.26 x10E6/uL (ref 3.77–5.28)
RDW: 13.2 % (ref 11.7–15.4)
WBC: 7.7 10*3/uL (ref 3.4–10.8)

## 2021-10-20 ENCOUNTER — Other Ambulatory Visit: Payer: Self-pay | Admitting: Family

## 2021-11-04 ENCOUNTER — Other Ambulatory Visit: Payer: Self-pay | Admitting: Family

## 2021-11-04 DIAGNOSIS — G629 Polyneuropathy, unspecified: Secondary | ICD-10-CM

## 2021-12-12 ENCOUNTER — Other Ambulatory Visit: Payer: Self-pay | Admitting: Family

## 2021-12-12 DIAGNOSIS — E1169 Type 2 diabetes mellitus with other specified complication: Secondary | ICD-10-CM

## 2021-12-12 DIAGNOSIS — E1165 Type 2 diabetes mellitus with hyperglycemia: Secondary | ICD-10-CM

## 2021-12-14 ENCOUNTER — Other Ambulatory Visit: Payer: Self-pay | Admitting: Family

## 2021-12-14 DIAGNOSIS — M810 Age-related osteoporosis without current pathological fracture: Secondary | ICD-10-CM

## 2021-12-30 ENCOUNTER — Other Ambulatory Visit: Payer: Self-pay | Admitting: Family

## 2022-01-02 ENCOUNTER — Ambulatory Visit: Payer: Medicare Other | Admitting: Family

## 2022-01-16 ENCOUNTER — Other Ambulatory Visit: Payer: Self-pay | Admitting: Family

## 2022-01-16 DIAGNOSIS — N3281 Overactive bladder: Secondary | ICD-10-CM

## 2022-01-25 ENCOUNTER — Ambulatory Visit (INDEPENDENT_AMBULATORY_CARE_PROVIDER_SITE_OTHER): Payer: Medicare Other | Admitting: Family

## 2022-01-25 ENCOUNTER — Encounter: Payer: Self-pay | Admitting: Family

## 2022-01-25 VITALS — BP 160/69 | HR 68 | Ht 64.0 in | Wt 181.0 lb

## 2022-01-25 DIAGNOSIS — E1165 Type 2 diabetes mellitus with hyperglycemia: Secondary | ICD-10-CM

## 2022-01-25 DIAGNOSIS — N390 Urinary tract infection, site not specified: Secondary | ICD-10-CM

## 2022-01-25 DIAGNOSIS — K219 Gastro-esophageal reflux disease without esophagitis: Secondary | ICD-10-CM | POA: Diagnosis not present

## 2022-01-25 DIAGNOSIS — R609 Edema, unspecified: Secondary | ICD-10-CM

## 2022-01-25 DIAGNOSIS — R399 Unspecified symptoms and signs involving the genitourinary system: Secondary | ICD-10-CM | POA: Diagnosis not present

## 2022-01-25 DIAGNOSIS — M8000XD Age-related osteoporosis with current pathological fracture, unspecified site, subsequent encounter for fracture with routine healing: Secondary | ICD-10-CM

## 2022-01-25 DIAGNOSIS — E785 Hyperlipidemia, unspecified: Secondary | ICD-10-CM

## 2022-01-25 DIAGNOSIS — L989 Disorder of the skin and subcutaneous tissue, unspecified: Secondary | ICD-10-CM

## 2022-01-25 DIAGNOSIS — I10 Essential (primary) hypertension: Secondary | ICD-10-CM

## 2022-01-25 DIAGNOSIS — E1169 Type 2 diabetes mellitus with other specified complication: Secondary | ICD-10-CM

## 2022-01-25 DIAGNOSIS — K5900 Constipation, unspecified: Secondary | ICD-10-CM

## 2022-01-25 LAB — URINALYSIS, COMPLETE
Bilirubin, UA: NEGATIVE
Glucose, UA: NEGATIVE
Ketones, UA: NEGATIVE
Leukocytes,UA: NEGATIVE
Nitrite, UA: NEGATIVE
Specific Gravity, UA: 1.02 (ref 1.005–1.030)
Urobilinogen, Ur: 0.2 mg/dL (ref 0.2–1.0)
pH, UA: 5 (ref 5.0–7.5)

## 2022-01-25 LAB — BAYER DCA HB A1C WAIVED: HB A1C (BAYER DCA - WAIVED): 7.5 % — ABNORMAL HIGH (ref 4.8–5.6)

## 2022-01-25 LAB — MICROSCOPIC EXAMINATION

## 2022-01-25 MED ORDER — FUROSEMIDE 20 MG PO TABS: 20.0000 mg | ORAL_TABLET | Freq: Every day | ORAL | 1 refills | Status: DC

## 2022-01-25 MED ORDER — CEPHALEXIN 250 MG PO CAPS: 250.0000 mg | ORAL_CAPSULE | Freq: Every day | ORAL | 1 refills | Status: DC

## 2022-01-25 NOTE — Patient Instructions (Signed)
Peripheral Edema  Peripheral edema is swelling that is caused by a buildup of fluid. Peripheral edema most often affects the lower legs, ankles, and feet. It can also develop in the arms, hands, and face. The area of the body that has peripheral edema will look swollen. It may also feel heavy or warm. Your clothes may start to feel tight. Pressing on the area may make a temporary dent in your skin (pitting edema). You may not be able to move your swollen arm or leg as much as usual. There are many causes of peripheral edema. It can happen because of a complication of other conditions such as heart failure, kidney disease, or a problem with your circulation. It also can be a side effect of certain medicines or happen because of an infection. It often happens to women during pregnancy. Sometimes, the cause is not known. Follow these instructions at home: Managing pain, stiffness, and swelling  Raise (elevate) your legs while you are sitting or lying down. Move around often to prevent stiffness and to reduce swelling. Do not sit or stand for long periods of time. Do not wear tight clothing. Do not wear garters on your upper legs. Exercise your legs to get your circulation going. This helps to move the fluid back into your blood vessels, and it may help the swelling go down. Wear compression stockings as told by your health care provider. These stockings help to prevent blood clots and reduce swelling in your legs. It is important that these are the correct size. These stockings should be prescribed by your doctor to prevent possible injuries. If elastic bandages or wraps are recommended, use them as told by your health care provider. Medicines Take over-the-counter and prescription medicines only as told by your health care provider. Your health care provider may prescribe medicine to help your body get rid of excess water (diuretic). Take this medicine if you are told to take it. General  instructions Eat a low-salt (low-sodium) diet as told by your health care provider. Sometimes, eating less salt may reduce swelling. Pay attention to any changes in your symptoms. Moisturize your skin daily to help prevent skin from cracking and draining. Keep all follow-up visits. This is important. Contact a health care provider if: You have a fever. You have swelling in only one leg. You have increased swelling, redness, or pain in one or both of your legs. You have drainage or sores at the area where you have edema. Get help right away if: You have edema that starts suddenly or is getting worse, especially if you are pregnant or have a medical condition. You develop shortness of breath, especially when you are lying down. You have pain in your chest or abdomen. You feel weak. You feel like you will faint. These symptoms may be an emergency. Get help right away. Call 911. Do not wait to see if the symptoms will go away. Do not drive yourself to the hospital. Summary Peripheral edema is swelling that is caused by a buildup of fluid. Peripheral edema most often affects the lower legs, ankles, and feet. Move around often to prevent stiffness and to reduce swelling. Do not sit or stand for long periods of time. Pay attention to any changes in your symptoms. Contact a health care provider if you have edema that starts suddenly or is getting worse, especially if you are pregnant or have a medical condition. Get help right away if you develop shortness of breath, especially when lying down.   This information is not intended to replace advice given to you by your health care provider. Make sure you discuss any questions you have with your health care provider. Document Revised: 04/17/2021 Document Reviewed: 04/17/2021 Elsevier Patient Education  2023 [employer]  

## 2022-01-25 NOTE — Progress Notes (Signed)
Subjective:    Patient ID: Gabriella Ferguson, female    DOB: 1928/12/09, 88 y.o.   MRN: [id number]  Chief Complaint  Patient presents with   Follow-up    3 month   Pt presents to the office today for chronic follow up. She has osteoporosis and taking Fosamax weekly.   She has hx of recurrent UTI and takes Keflex 250 mg daily.   Pt complaining of bilateral peripheral edema. She has gained 12 lbs since our last visit.   Complaining of nonhealing skin lesions on her her chin and nose she has had for months.  Hypertension This is a chronic problem. The current episode started more than 1 year ago. The problem has been waxing and waning since onset. The problem is uncontrolled. Associated symptoms include malaise/fatigue and peripheral edema. Pertinent negatives include no blurred vision or shortness of breath. Risk factors for coronary artery disease include dyslipidemia, diabetes mellitus, obesity and sedentary lifestyle. The current treatment provides mild improvement.  Gastroesophageal Reflux She complains of belching and heartburn. This is a chronic problem. The current episode started more than 1 year ago. The problem occurs occasionally. Risk factors include obesity. She has tried a diet change for the symptoms. The treatment provided moderate relief.  Hyperlipidemia This is a chronic problem. The current episode started more than 1 year ago. Exacerbating diseases include obesity. Pertinent negatives include no shortness of breath. Current antihyperlipidemic treatment includes statins. The current treatment provides moderate improvement of lipids. Risk factors for coronary artery disease include diabetes mellitus, dyslipidemia, hypertension and a sedentary lifestyle.  Diabetes She presents for her follow-up diabetic visit. She has type 2 diabetes mellitus. Associated symptoms include foot paresthesias. Pertinent negatives for diabetes include no blurred vision. Diabetic complications  include heart disease and peripheral neuropathy. Risk factors for coronary artery disease include dyslipidemia, hypertension, diabetes mellitus, sedentary lifestyle and post-menopausal. She is following a generally unhealthy diet. Her overall blood glucose range is 140-180 mg/dl. Eye exam is not current.  Constipation This is a chronic problem. The current episode started more than 1 year ago. The problem has been waxing and waning since onset. Risk factors include obesity. She has tried laxatives for the symptoms. The treatment provided moderate relief.  Urinary Frequency  This is a chronic problem. The problem occurs intermittently. The patient is experiencing no pain. Associated symptoms include frequency. Pertinent negatives include no hematuria or hesitancy. Treatments tried: myrbetriq.     Review of Systems  Constitutional:  Positive for malaise/fatigue.  Eyes:  Negative for blurred vision.  Respiratory:  Negative for shortness of breath.   Gastrointestinal:  Positive for constipation and heartburn.  Genitourinary:  Positive for frequency. Negative for hematuria and hesitancy.  All other systems reviewed and are negative.     Objective:   Physical Exam Vitals reviewed.  Constitutional:      General: She is not in acute distress.    Appearance: She is well-developed. She is obese.  HENT:     Head: Normocephalic and atraumatic.      Right Ear: Tympanic membrane normal.     Left Ear: Tympanic membrane normal.  Eyes:     Pupils: Pupils are equal, round, and reactive to light.  Neck:     Thyroid: No thyromegaly.  Cardiovascular:     Rate and Rhythm: Normal rate and regular rhythm.     Heart sounds: Normal heart sounds. No murmur heard. Pulmonary:     Effort: Pulmonary effort is normal. No respiratory distress.  Breath sounds: Normal breath sounds. No wheezing.  Abdominal:     General: Bowel sounds are normal. There is no distension.     Palpations: Abdomen is soft.      Tenderness: There is no abdominal tenderness.  Musculoskeletal:        General: No tenderness. Normal range of motion.     Cervical back: Normal range of motion and neck supple.     Right lower leg: Edema (3+) present.     Left lower leg: Edema (3+) present.  Skin:    General: Skin is warm and dry.     Comments: Bruising present on right lower leg  Neurological:     Mental Status: She is alert and oriented to person, place, and time.     Cranial Nerves: No cranial nerve deficit.     Deep Tendon Reflexes: Reflexes are normal and symmetric.  Psychiatric:        Behavior: Behavior normal.        Thought Content: Thought content normal.        Judgment: Judgment normal.      BP (!) 160/69   Pulse 68   Ht $R'5\' 4"'Nn$  (1.626 m)   Wt 181 lb (82.1 kg)   SpO2 96%   BMI 31.07 kg/m      Assessment & Plan:  [name] comes in today with chief complaint of Follow-up (3 month)   Diagnosis and orders addressed:  1. Type 2 diabetes mellitus with hyperglycemia, without long-term current use of insulin (HCC) - CMP14+EGFR - POCT glycosylated hemoglobin (Hb A1C) - Urinalysis, Complete - Bayer DCA Hb A1c Waived - Microalbumin / creatinine urine ratio - CBC with Differential/Platelet  2. UTI symptoms - CMP14+EGFR - Urinalysis, Complete - Urine Culture - CBC with Differential/Platelet  3. Essential hypertension - CBC with Differential/Platelet  4. Gastroesophageal reflux disease, unspecified whether esophagitis present - CBC with Differential/Platelet  5. Hyperlipidemia associated with type 2 diabetes mellitus (HCC) - CBC with Differential/Platelet  6. Constipation, unspecified constipation type - CBC with Differential/Platelet  7. Osteoporosis with current pathological fracture with routine healing, unspecified osteoporosis type, subsequent encounter - CBC with Differential/Platelet  8. Peripheral edema -Start wearing compression hose daily Low salt diet Start Lasix  daily - Compression stockings - furosemide (LASIX) 20 MG tablet; Take 1 tablet (20 mg total) by mouth daily.  Dispense: 90 tablet; Refill: 1 - CBC with Differential/Platelet  9. Recurrent UTI - CBC with Differential/Platelet - cephALEXin (KEFLEX) 250 MG capsule; Take 1 capsule (250 mg total) by mouth daily.  Dispense: 90 capsule; Refill: 1  10. Skin lesion - Ambulatory referral to Dermatology   Labs pending Health Maintenance reviewed Diet and exercise encouraged  Follow up plan: 1 month to recheck peripheral edema   [name], FNP

## 2022-01-26 LAB — CMP14+EGFR
ALT: 10 IU/L (ref 0–32)
AST: 14 IU/L (ref 0–40)
Albumin/Globulin Ratio: 1.4 (ref 1.2–2.2)
Albumin: 3.9 g/dL (ref 3.5–4.6)
Alkaline Phosphatase: 56 IU/L (ref 44–121)
BUN/Creatinine Ratio: 14 (ref 12–28)
BUN: 17 mg/dL (ref 10–36)
Bilirubin Total: 0.3 mg/dL (ref 0.0–1.2)
CO2: 25 mmol/L (ref 20–29)
Calcium: 9.3 mg/dL (ref 8.7–10.3)
Chloride: 102 mmol/L (ref 96–106)
Creatinine, Ser: 1.2 mg/dL — ABNORMAL HIGH (ref 0.57–1.00)
Globulin, Total: 2.7 g/dL (ref 1.5–4.5)
Glucose: 112 mg/dL — ABNORMAL HIGH (ref 70–99)
Potassium: 4.5 mmol/L (ref 3.5–5.2)
Sodium: 142 mmol/L (ref 134–144)
Total Protein: 6.6 g/dL (ref 6.0–8.5)
eGFR: 43 mL/min/{1.73_m2} — ABNORMAL LOW (ref >59)

## 2022-01-26 LAB — CBC WITH DIFFERENTIAL/PLATELET
Basophils Absolute: 0 10*3/uL (ref 0.0–0.2)
Basos: 0 %
EOS (ABSOLUTE): 0.2 10*3/uL (ref 0.0–0.4)
Eos: 2 %
Hematocrit: 36.4 % (ref 34.0–46.6)
Hemoglobin: 11.7 g/dL (ref 11.1–15.9)
Immature Grans (Abs): 0 10*3/uL (ref 0.0–0.1)
Immature Granulocytes: 0 %
Lymphocytes Absolute: 2.8 10*3/uL (ref 0.7–3.1)
Lymphs: 35 %
MCH: 27.7 pg (ref 26.6–33.0)
MCHC: 32.1 g/dL (ref 31.5–35.7)
MCV: 86 fL (ref 79–97)
Monocytes Absolute: 0.8 10*3/uL (ref 0.1–0.9)
Monocytes: 10 %
Neutrophils Absolute: 4.3 10*3/uL (ref 1.4–7.0)
Neutrophils: 53 %
Platelets: 259 10*3/uL (ref 150–450)
RBC: 4.23 x10E6/uL (ref 3.77–5.28)
RDW: 13.2 % (ref 11.7–15.4)
WBC: 8.1 10*3/uL (ref 3.4–10.8)

## 2022-01-26 LAB — MICROALBUMIN / CREATININE URINE RATIO
Creatinine, Urine: 78.3 mg/dL
Microalb/Creat Ratio: 430 mg/g creat — ABNORMAL HIGH (ref 0–29)
Microalbumin, Urine: 336.4 ug/mL

## 2022-01-27 LAB — URINE CULTURE

## 2022-02-03 ENCOUNTER — Other Ambulatory Visit: Payer: Self-pay | Admitting: Family

## 2022-02-03 DIAGNOSIS — G629 Polyneuropathy, unspecified: Secondary | ICD-10-CM

## 2022-02-10 ENCOUNTER — Other Ambulatory Visit: Payer: Self-pay | Admitting: Family

## 2022-02-10 DIAGNOSIS — E1165 Type 2 diabetes mellitus with hyperglycemia: Secondary | ICD-10-CM

## 2022-02-13 ENCOUNTER — Ambulatory Visit (INDEPENDENT_AMBULATORY_CARE_PROVIDER_SITE_OTHER): Payer: Medicare Other

## 2022-02-13 VITALS — Wt 181.0 lb

## 2022-02-13 DIAGNOSIS — Z0000 Encounter for general adult medical examination without abnormal findings: Secondary | ICD-10-CM | POA: Diagnosis not present

## 2022-02-13 NOTE — Patient Instructions (Signed)
Gabriella Ferguson , Thank you for taking time to come for your Medicare Wellness Visit. I appreciate your ongoing commitment to your health goals. Please review the following plan we discussed and let me know if I can assist you in the future.   Screening recommendations/referrals: Colonoscopy: No longer required Mammogram: No longer required Bone Density: Done 10/22/2019 - no repeat required due to age Recommended yearly ophthalmology/optometry visit for glaucoma screening and checkup Recommended yearly dental visit for hygiene and checkup  Vaccinations: Influenza vaccine: Done 06/29/2021 - Repeat annually Pneumococcal vaccine: Done 12/31/2016 & 04/09/2018      Tdap vaccine: Done 12/31/2016 - Repeat in 10 years Shingles vaccine: Done 02/02/2021 & 10/02/2021 Covid-19: Done 10/26/2019, 11/23/2019, & 07/19/2020   Advanced directives: Please bring a copy of your health care power of attorney and living will to the office to be added to your chart at your convenience.   Conditions/risks identified: Aim for 4-6 glasses of water daily, plenty of protein in your diet and try to get up and walk/ stretch every hour for 5-10 minutes at a time.   Next appointment: Follow up in one year for your annual wellness visit    Preventive Care 65 Years and Older, Female Preventive care refers to lifestyle choices and visits with your health care provider that can promote health and wellness. What does preventive care include? A yearly physical exam. This is also called an annual well check. Dental exams once or twice a year. Routine eye exams. Ask your health care provider how often you should have your eyes checked. Personal lifestyle choices, including: Daily care of your teeth and gums. Regular physical activity. Eating a healthy diet. Avoiding tobacco and drug use. Limiting alcohol use. Practicing safe sex. Taking low-dose aspirin every day. Taking vitamin and mineral supplements as recommended by your health  care provider. What happens during an annual well check? The services and screenings done by your health care provider during your annual well check will depend on your age, overall health, lifestyle risk factors, and family history of disease. Counseling  Your health care provider may ask you questions about your: Alcohol use. Tobacco use. Drug use. Emotional well-being. Home and relationship well-being. Sexual activity. Eating habits. History of falls. Memory and ability to understand (cognition). Work and work Statistician. Reproductive health. Screening  You may have the following tests or measurements: Height, weight, and BMI. Blood pressure. Lipid and cholesterol levels. These may be checked every 5 years, or more frequently if you are over 86 years old. Skin check. Lung cancer screening. You may have this screening every year starting at age 86 if you have a 30-pack-year history of smoking and currently smoke or have quit within the past 15 years. Fecal occult blood test (FOBT) of the stool. You may have this test every year starting at age 86. Flexible sigmoidoscopy or colonoscopy. You may have a sigmoidoscopy every 5 years or a colonoscopy every 10 years starting at age 86. Hepatitis C blood test. Hepatitis B blood test. Sexually transmitted disease (STD) testing. Diabetes screening. This is done by checking your blood sugar (glucose) after you have not eaten for a while (fasting). You may have this done every 1-3 years. Bone density scan. This is done to screen for osteoporosis. You may have this done starting at age 86. Mammogram. This may be done every 1-2 years. Talk to your health care provider about how often you should have regular mammograms. Talk with your health care provider about your  test results, treatment options, and if necessary, the need for more tests. Vaccines  Your health care provider may recommend certain vaccines, such as: Influenza vaccine. This is  recommended every year. Tetanus, diphtheria, and acellular pertussis (Tdap, Td) vaccine. You may need a Td booster every 10 years. Zoster vaccine. You may need this after age 86. Pneumococcal 13-valent conjugate (PCV13) vaccine. One dose is recommended after age 86. Pneumococcal polysaccharide (PPSV23) vaccine. One dose is recommended after age 86. Talk to your health care provider about which screenings and vaccines you need and how often you need them. This information is not intended to replace advice given to you by your health care provider. Make sure you discuss any questions you have with your health care provider. Document Released: 09/09/2015 Document Revised: 05/02/2016 Document Reviewed: 06/14/2015 Elsevier Interactive Patient Education  2017 [place] Prevention in the Home Falls can cause injuries. They can happen to people of all ages. There are many things you can do to make your home safe and to help prevent falls. What can I do on the outside of my home? Regularly fix the edges of walkways and driveways and fix any cracks. Remove anything that might make you trip as you walk through a door, such as a raised step or threshold. Trim any bushes or trees on the path to your home. Use bright outdoor lighting. Clear any walking paths of anything that might make someone trip, such as rocks or tools. Regularly check to see if handrails are loose or broken. Make sure that both sides of any steps have handrails. Any raised decks and porches should have guardrails on the edges. Have any leaves, snow, or ice cleared regularly. Use sand or salt on walking paths during winter. Clean up any spills in your garage right away. This includes oil or grease spills. What can I do in the bathroom? Use night lights. Install grab bars by the toilet and in the tub and shower. Do not use towel bars as grab bars. Use non-skid mats or decals in the tub or shower. If you need to sit down in  the shower, use a plastic, non-slip stool. Keep the floor dry. Clean up any water that spills on the floor as soon as it happens. Remove soap buildup in the tub or shower regularly. Attach bath mats securely with double-sided non-slip rug tape. Do not have throw rugs and other things on the floor that can make you trip. What can I do in the bedroom? Use night lights. Make sure that you have a light by your bed that is easy to reach. Do not use any sheets or blankets that are too big for your bed. They should not hang down onto the floor. Have a firm chair that has side arms. You can use this for support while you get dressed. Do not have throw rugs and other things on the floor that can make you trip. What can I do in the kitchen? Clean up any spills right away. Avoid walking on wet floors. Keep items that you use a lot in easy-to-reach places. If you need to reach something above you, use a strong step stool that has a grab bar. Keep electrical cords out of the way. Do not use floor polish or wax that makes floors slippery. If you must use wax, use non-skid floor wax. Do not have throw rugs and other things on the floor that can make you trip. What can I do with my  stairs? Do not leave any items on the stairs. Make sure that there are handrails on both sides of the stairs and use them. Fix handrails that are broken or loose. Make sure that handrails are as long as the stairways. Check any carpeting to make sure that it is firmly attached to the stairs. Fix any carpet that is loose or worn. Avoid having throw rugs at the top or bottom of the stairs. If you do have throw rugs, attach them to the floor with carpet tape. Make sure that you have a light switch at the top of the stairs and the bottom of the stairs. If you do not have them, ask someone to add them for you. What else can I do to help prevent falls? Wear shoes that: Do not have high heels. Have rubber bottoms. Are comfortable  and fit you well. Are closed at the toe. Do not wear sandals. If you use a stepladder: Make sure that it is fully opened. Do not climb a closed stepladder. Make sure that both sides of the stepladder are locked into place. Ask someone to hold it for you, if possible. Clearly mark and make sure that you can see: Any grab bars or handrails. First and last steps. Where the edge of each step is. Use tools that help you move around (mobility aids) if they are needed. These include: Canes. Walkers. Scooters. Crutches. Turn on the lights when you go into a dark area. Replace any light bulbs as soon as they burn out. Set up your furniture so you have a clear path. Avoid moving your furniture around. If any of your floors are uneven, fix them. If there are any pets around you, be aware of where they are. Review your medicines with your doctor. Some medicines can make you feel dizzy. This can increase your chance of falling. Ask your doctor what other things that you can do to help prevent falls. This information is not intended to replace advice given to you by your health care provider. Make sure you discuss any questions you have with your health care provider. Document Released: 06/09/2009 Document Revised: 01/19/2016 Document Reviewed: 09/17/2014 Elsevier Interactive Patient Education  2017 [employer].

## 2022-02-13 NOTE — Progress Notes (Signed)
Subjective:   Gabriella Ferguson is a 88 y.o. female who presents for Medicare Annual (Subsequent) preventive examination.  Virtual Visit via Telephone Note  I connected with  Gabriella Ferguson on 02/13/22 at  2:00 PM EDT by telephone and verified that I am speaking with the correct person using two identifiers.  Location: Patient: Home Provider: [facility] Persons participating in the virtual visit: patient/Nurse Health Advisor   I discussed the limitations, risks, security and privacy concerns of performing an evaluation and management service by telephone and the availability of in person appointments. The patient expressed understanding and agreed to proceed.  Interactive audio and video telecommunications were attempted between this nurse and patient, however failed, due to patient having technical difficulties OR patient did not have access to video capability.  We continued and completed visit with audio only.  Some vital signs may be absent or patient reported.   Amy E Hopkins, LPN   Review of Systems     Cardiac Risk Factors include: advanced age (>54mn, >>72women);diabetes mellitus;dyslipidemia;hypertension;sedentary lifestyle;obesity (BMI >30kg/m2)     Objective:    Today's Vitals   02/13/22 1406  Weight: 181 lb (82.1 kg)   Body mass index is 31.07 kg/m.     02/13/2022    2:17 PM 01/24/2021   10:40 AM 01/22/2020    9:28 AM 01/21/2019    9:46 AM  Advanced Directives  Does Patient Have a Medical Advance Directive? Yes No No No  Type of [place]     Copy of [place]? No - copy requested     Would patient like information on creating a medical advance directive?  No - Guardian declined No - Patient declined No - Patient declined    Current Medications (verified) Outpatient Encounter Medications as of 02/13/2022  Medication Sig   alendronate (FOSAMAX) 70 MG tablet TAKE 1 TABLET ONCE A  WEEK WITH A FULL GLASS OF WATER ON AN EMPTY STOMACH   aspirin EC 81 MG tablet Take 81 mg by mouth daily.   blood glucose meter kit and supplies Dispense based on patient and insurance preference. Use up to four times daily as directed. E11.9   cephALEXin (KEFLEX) 250 MG capsule Take 1 capsule (250 mg total) by mouth daily.   furosemide (LASIX) 20 MG tablet Take 1 tablet (20 mg total) by mouth daily.   gabapentin (NEURONTIN) 300 MG capsule TAKE (1) CAPSULE TWICE DAILY.   glipiZIDE (GLUCOTROL XL) 2.5 MG 24 hr tablet TAKE (1) TABLET DAILY WITH BREAKFAST.   glucose blood (CONTOUR NEXT TEST) test strip Check BS up to 4 times daily as directed Dx E11.9   Insulin Pen Needle (AUM MINI INSULIN PEN NEEDLE) 32G X 8 MM MISC 1 applicator by Does not apply route daily.   LANTUS SOLOSTAR 100 UNIT/ML Solostar Pen INJECT 18 UNITS AT BEDTIME   metFORMIN (GLUCOPHAGE) 500 MG tablet TAKE (1) TABLET TWICE A DAY WITH MEALS (BREAKFAST AND SUPPER)   Multiple Vitamins-Minerals (CENTRUM ADULTS) TABS Take by mouth.   MYRBETRIQ 50 MG TB24 tablet TAKE ONE TABLET ONCE DAILY   RYBELSUS 7 MG TABS TAKE 1 TABLET DAILY   simvastatin (ZOCOR) 20 MG tablet TAKE 1 TABLET ONCE DAILY   TRUEplus Lancets 33G MISC Check BS up to 4 times daily as directed Dx E11.9   [DISCONTINUED] nystatin (MYCOSTATIN/NYSTOP) powder Apply 1 application topically 3 (three) times daily. (Patient not taking: Reported on 02/13/2022)   No  facility-administered encounter medications on file as of 02/13/2022.    Allergies (verified) Semaglutide   History: Past Medical History:  Diagnosis Date   Breast cancer ([place])    Diabetes mellitus without complication ([place])    Type 2   Hip fracture ([place])    Meningitis    Past Surgical History:  Procedure Laterality Date   HIP CLOSED REDUCTION     Family History  Problem Relation Age of Onset   Diabetes Daughter    Stroke Mother    Stroke Father    Social History   Socioeconomic History   Marital status:  Widowed    Spouse name: Not on file   Number of children: 2   Years of education: Not on file   Highest education level: 10th grade  Occupational History   Occupation: Retired  Tobacco Use   Smoking status: Never   Smokeless tobacco: Never  Vaping Use   Vaping Use: Never used  Substance and Sexual Activity   Alcohol use: No   Drug use: No   Sexual activity: Not Currently  Other Topics Concern   Not on file  Social History Narrative   Lives alone in senior apartments ([place]) on ground level   Daughter visits daily and helps with medication and diabetes.    Family members take her to appts and shopping.   Social Determinants of Health   Financial Resource Strain: Low Risk  (02/13/2022)   Overall Financial Resource Strain (CARDIA)    Difficulty of Paying Living Expenses: Not hard at all  Food Insecurity: No Food Insecurity (02/13/2022)   Hunger Vital Sign    Worried About Running Out of Food in the Last Year: Never true    Ran Out of Food in the Last Year: Never true  Transportation Needs: No Transportation Needs (02/13/2022)   PRAPARE - Hydrologist (Medical): No    Lack of Transportation (Non-Medical): No  Physical Activity: Insufficiently Active (02/13/2022)   Exercise Vital Sign    Days of Exercise per Week: 7 days    Minutes of Exercise per Session: 10 min  Stress: No Stress Concern Present (02/13/2022)   [place]    Feeling of Stress : Not at all  Social Connections: Moderately Integrated (02/13/2022)   Social Connection and Isolation Panel [NHANES]    Frequency of Communication with Friends and Family: More than three times a week    Frequency of Social Gatherings with Friends and Family: More than three times a week    Attends Religious Services: More than 4 times per year    Active Member of [employer] or Organizations: Yes    Attends Archivist Meetings: More than  4 times per year    Marital Status: Widowed    Tobacco Counseling Counseling given: Not Answered   Clinical Intake:  Pre-visit preparation completed: Yes  Pain : No/denies pain     BMI - recorded: 31.07 Nutritional Status: BMI > 30  Obese Nutritional Risks: None Diabetes: Yes CBG done?: No Did pt. bring in CBG monitor from home?: No  How often do you need to have someone help you when you read instructions, pamphlets, or other written materials from your doctor or pharmacy?: 1 - Never  Diabetic? Nutrition Risk Assessment:  Has the patient had any N/V/D within the last 2 months?  No  Does the patient have any non-healing wounds?  No  Has the patient had any  unintentional weight loss or weight gain?  No   Diabetes:  Is the patient diabetic?  Yes  If diabetic, was a CBG obtained today?  No  Did the patient bring in their glucometer from home?  No  How often do you monitor your CBG's? Once daily.   Financial Strains and Diabetes Management:  Are you having any financial strains with the device, your supplies or your medication? No .  Does the patient want to be seen by Chronic Care Management for management of their diabetes?  No  Would the patient like to be referred to a Nutritionist or for Diabetic Management?  No   Diabetic Exams:  Diabetic Eye Exam: Completed 2020. Overdue for diabetic eye exam. Pt has been advised about the importance in completing this exam. Declines referral  Diabetic Foot Exam: Completed 01/25/2022. Pt has been advised about the importance in completing this exam.   Interpreter Needed?: No  Information entered by :: Tali Cleaves, LPN   Activities of Daily Living    02/13/2022    2:17 PM  In your present state of health, do you have any difficulty performing the following activities:  Hearing? 0  Vision? 0  Difficulty concentrating or making decisions? 0  Walking or climbing stairs? 1  Dressing or bathing? 0  Doing errands, shopping? 1   Comment no longer drives - family drives her  Preparing Food and eating ? N  Using the Toilet? N  In the past six months, have you accidently leaked urine? Y  Comment urge incontinence  Do you have problems with loss of bowel control? N  Managing your Medications? N  Managing your Finances? N  Housekeeping or managing your Housekeeping? N    Patient Care Team: Sharion Balloon, FNP as PCP - General (Family Medicine) Lavera Guise, [facility] (Pharmacist) Celestia Khat, OD (Optometry)  Indicate any recent Medical Services you may have received from other than [facility] providers in the past year (date may be approximate).     Assessment:   This is a routine wellness examination for [place].  Hearing/Vision screen Hearing Screening - Comments:: Denies hearing difficulties   Vision Screening - Comments:: Wears reading glasses prn only - behind with routine eye exams with [facility]  Dietary issues and exercise activities discussed: Current Exercise Habits: Home exercise routine, Type of exercise: walking, Time (Minutes): 10, Frequency (Times/Week): 7, Weekly Exercise (Minutes/Week): 70, Intensity: Mild, Exercise limited by: orthopedic condition(s)   Goals Addressed             This Visit's Progress    Diabetes Management   On track    Current Barriers:  Chronic Disease Management support and education needs related to diabetes Unable/unwilling to inject insulin or check blood sugar herself  Nurse Case Manager Clinical Goal(s):  Over the next 30 days, patient will work with diabetes educator to address needs related to diabetes management  Interventions:  Evaluation of current treatment plan related to diabetes and patient's adherence to plan as established by provider. Reviewed medications with patient and discussed amaryl, metformin, januvia Collaborated with [name], FNP regarding setting her up to talk with diabetes educator by telephone since she is unable/unwilling  to drive to appointments Discussed plans with patient for ongoing care management follow up and provided patient with direct contact information for care management team Advised patient, providing education and rationale, to check cbg daily and record, calling [phone] for findings outside established parameters.    Patient Self Care Activities:  Performs ADL's independently Performs IADL's independently Has assistance from daughter with medication management   Initial goal documentation      Exercise 150 min/wk Moderate Activity   Not on track      Depression Screen    02/13/2022    2:15 PM 01/25/2022    2:32 PM 10/02/2021    2:14 PM 06/29/2021    1:47 PM 03/30/2021    2:08 PM 01/24/2021   10:42 AM 12/28/2020   11:40 AM  PHQ 2/9 Scores  PHQ - 2 Score 0 0 0 0 0 0 0  PHQ- 9 Score    0 0      Fall Risk    02/13/2022    2:12 PM 01/25/2022    2:43 PM 10/02/2021    2:14 PM 06/29/2021    1:47 PM 03/30/2021    2:08 PM  [place] in the past year? 1 0 0 0 0  Number falls in past yr: 1      Injury with Fall? 0      Risk for fall due to : History of fall(s);Impaired balance/gait;Orthopedic patient      Follow up Education provided;Falls prevention discussed        FALL RISK PREVENTION PERTAINING TO THE HOME:  Any stairs in or around the home? No  If so, are there any without handrails? No  Home free of loose throw rugs in walkways, pet beds, electrical cords, etc? Yes  Adequate lighting in your home to reduce risk of falls? Yes   ASSISTIVE DEVICES UTILIZED TO PREVENT FALLS:  Life alert? No  Use of a cane, walker or w/c? Yes  Grab bars in the bathroom? Yes  Shower chair or bench in shower? Yes  Elevated toilet seat or a handicapped toilet? Yes   TIMED UP AND GO:  Was the test performed? No . Telephonic visit  Cognitive Function:        02/13/2022    2:18 PM 01/24/2021   10:47 AM 01/22/2020    9:30 AM 01/21/2019    9:58 AM  6CIT Screen  What Year? 0 points 0  points 0 points 0 points  What month? 0 points 0 points 0 points 0 points  What time? 0 points 0 points 0 points 0 points  Count back from 20 0 points 0 points 0 points 0 points  Months in reverse 2 points 0 points 0 points 0 points  Repeat phrase 8 points 10 points 4 points 0 points  Total Score 10 points 10 points 4 points 0 points    Immunizations Immunization History  Administered Date(s) Administered   Fluad Quad(high Dose 65+) 06/10/2020, 06/29/2021   Moderna Sars-Covid-2 Vaccination 10/26/2019, 11/23/2019, 07/19/2020   Pneumococcal Conjugate-13 12/31/2016   Pneumococcal Polysaccharide-23 04/09/2018   Tdap 12/31/2016   Zoster Recombinat (Shingrix) 02/02/2021, 10/02/2021    TDAP status: Up to date  Flu Vaccine status: Up to date  Pneumococcal vaccine status: Up to date  Covid-19 vaccine status: Completed vaccines  Qualifies for Shingles Vaccine? Yes   Zostavax completed Yes   Shingrix Completed?: Yes  Screening Tests Health Maintenance  Topic Date Due   OPHTHALMOLOGY EXAM  10/08/2019   COVID-19 Vaccine (4 - Moderna series) 09/13/2020   INFLUENZA VACCINE  03/27/2022   HEMOGLOBIN A1C  07/27/2022   FOOT EXAM  01/26/2023   URINE MICROALBUMIN  01/26/2023   TETANUS/TDAP  01/01/2027   Pneumonia Vaccine 32+ Years old  Completed   Zoster Vaccines- Shingrix  Completed   HPV VACCINES  Aged Out   DEXA SCAN  Discontinued    Health Maintenance  Health Maintenance Due  Topic Date Due   OPHTHALMOLOGY EXAM  10/08/2019   COVID-19 Vaccine (4 - Moderna series) 09/13/2020    Colorectal cancer screening: No longer required.   Mammogram status: No longer required due to age.  Bone Density status: Completed 10/22/2019. Results reflect: Bone density results: OSTEOPOROSIS. Repeat every 2 years. Not required due to age  Lung Cancer Screening: (Low Dose CT Chest recommended if Age 15-80 years, 30 pack-year currently smoking OR have quit w/in 15years.) does not qualify.    Additional Screening:  Hepatitis C Screening: does not qualify  Vision Screening: Recommended annual ophthalmology exams for early detection of glaucoma and other disorders of the eye. Is the patient up to date with their annual eye exam?  No  Who is the provider or what is the name of the office in which the patient attends annual eye exams? [facility] If pt is not established with a provider, would they like to be referred to a provider to establish care? No .   Dental Screening: Recommended annual dental exams for proper oral hygiene  Community Resource Referral / Chronic Care Management: CRR required this visit?  No   CCM required this visit?  No      Plan:     I have personally reviewed and noted the following in the patient's chart:   Medical and social history Use of alcohol, tobacco or illicit drugs  Current medications and supplements including opioid prescriptions.  Functional ability and status Nutritional status Physical activity Advanced directives List of other physicians Hospitalizations, surgeries, and ER visits in previous 12 months Vitals Screenings to include cognitive, depression, and falls Referrals and appointments  In addition, I have reviewed and discussed with patient certain preventive protocols, quality metrics, and best practice recommendations. A written personalized care plan for preventive services as well as general preventive health recommendations were provided to patient.     Sandrea Hammond, LPN   4/92/0100   Nurse Notes: None

## 2022-03-12 ENCOUNTER — Encounter: Payer: Self-pay | Admitting: Family

## 2022-03-12 ENCOUNTER — Ambulatory Visit (INDEPENDENT_AMBULATORY_CARE_PROVIDER_SITE_OTHER): Payer: Medicare Other | Admitting: Family

## 2022-03-12 ENCOUNTER — Other Ambulatory Visit: Payer: Self-pay | Admitting: Family

## 2022-03-12 VITALS — BP 134/58 | HR 76 | Temp 97.8°F | Ht 64.0 in | Wt 181.0 lb

## 2022-03-12 DIAGNOSIS — I10 Essential (primary) hypertension: Secondary | ICD-10-CM

## 2022-03-12 DIAGNOSIS — R609 Edema, unspecified: Secondary | ICD-10-CM

## 2022-03-12 DIAGNOSIS — E1169 Type 2 diabetes mellitus with other specified complication: Secondary | ICD-10-CM

## 2022-03-12 DIAGNOSIS — M810 Age-related osteoporosis without current pathological fracture: Secondary | ICD-10-CM

## 2022-03-12 DIAGNOSIS — L03115 Cellulitis of right lower limb: Secondary | ICD-10-CM

## 2022-03-12 DIAGNOSIS — E1165 Type 2 diabetes mellitus with hyperglycemia: Secondary | ICD-10-CM

## 2022-03-12 MED ORDER — FUROSEMIDE 20 MG PO TABS: ORAL_TABLET | ORAL | 1 refills | Status: DC

## 2022-03-12 MED ORDER — DOXYCYCLINE HYCLATE 100 MG PO TABS: 100.0000 mg | ORAL_TABLET | Freq: Two times a day (BID) | ORAL | 0 refills | Status: DC

## 2022-03-12 NOTE — Progress Notes (Signed)
   Subjective:    Patient ID: Gabriella Ferguson, female    DOB: 07/12/1929, 88 y.o.   MRN: [id number]  Chief Complaint  Patient presents with   Follow-up    HPI PT presents to the office today to follow up on peripheral edema. We recommend that she wear compression hose, but states the pair she got did not fit.   States her right leg is erythemas, soreness, and swelling. She takes lasix 20 mg daily.   Her DM is stable. Last A1C 7.5.    Review of Systems  All other systems reviewed and are negative.      Objective:   Physical Exam Vitals reviewed.  Constitutional:      General: She is not in acute distress.    Appearance: She is well-developed.  HENT:     Head: Normocephalic and atraumatic.     Right Ear: External ear normal.  Eyes:     Pupils: Pupils are equal, round, and reactive to light.  Neck:     Thyroid: No thyromegaly.  Cardiovascular:     Rate and Rhythm: Normal rate and regular rhythm.     Heart sounds: Normal heart sounds. No murmur heard. Pulmonary:     Effort: Pulmonary effort is normal. No respiratory distress.     Breath sounds: Normal breath sounds. No wheezing.  Abdominal:     General: Bowel sounds are normal. There is no distension.     Palpations: Abdomen is soft.     Tenderness: There is no abdominal tenderness.  Musculoskeletal:        General: No tenderness. Normal range of motion.     Cervical back: Normal range of motion and neck supple.     Right lower leg: Edema (2+, erythemas) present.  Skin:    General: Skin is warm and dry.  Neurological:     Mental Status: She is alert and oriented to person, place, and time.     Cranial Nerves: No cranial nerve deficit.     Deep Tendon Reflexes: Reflexes are normal and symmetric.  Psychiatric:        Behavior: Behavior normal.        Thought Content: Thought content normal.        Judgment: Judgment normal.       BP (!) 134/58   Pulse 76   Temp 97.8 F (36.6 C)   Ht '5\' 4"'$  (1.626 m)   Wt  181 lb (82.1 kg)   SpO2 99%   BMI 31.07 kg/m      Assessment & Plan:  [name] comes in today with chief complaint of Follow-up   Diagnosis and orders addressed:  1. Peripheral edema - furosemide (LASIX) 20 MG tablet; 40 mg for 5 days  Dispense: 90 tablet; Refill: 1  2. Type 2 diabetes mellitus with other specified complication, without long-term current use of insulin ([place])  3. Essential hypertension  4. Cellulitis of right lower extremity - doxycycline (VIBRA-TABS) 100 MG tablet; Take 1 tablet (100 mg total) by mouth 2 (two) times daily.  Dispense: 20 tablet; Refill: 0  Increase Lasix to 40 mg for 3-5 days  Start doxycycline  Keep leg elevated  Low salt diet  Compression hose RTO In 10 days   [name], FNP

## 2022-03-12 NOTE — Patient Instructions (Signed)

## 2022-03-26 ENCOUNTER — Encounter: Payer: Self-pay | Admitting: Family

## 2022-03-26 ENCOUNTER — Ambulatory Visit (INDEPENDENT_AMBULATORY_CARE_PROVIDER_SITE_OTHER): Payer: Medicare Other | Admitting: Family

## 2022-03-26 VITALS — BP 142/62 | HR 62 | Temp 97.7°F | Ht 64.0 in | Wt 180.2 lb

## 2022-03-26 DIAGNOSIS — N390 Urinary tract infection, site not specified: Secondary | ICD-10-CM

## 2022-03-26 DIAGNOSIS — R609 Edema, unspecified: Secondary | ICD-10-CM | POA: Diagnosis not present

## 2022-03-26 DIAGNOSIS — L03115 Cellulitis of right lower limb: Secondary | ICD-10-CM

## 2022-03-26 DIAGNOSIS — R300 Dysuria: Secondary | ICD-10-CM

## 2022-03-26 LAB — URINALYSIS, COMPLETE
Bilirubin, UA: NEGATIVE
Glucose, UA: NEGATIVE
Ketones, UA: NEGATIVE
Nitrite, UA: NEGATIVE
Protein,UA: NEGATIVE
Specific Gravity, UA: 1.015 (ref 1.005–1.030)
Urobilinogen, Ur: 0.2 mg/dL (ref 0.2–1.0)
pH, UA: 5 (ref 5.0–7.5)

## 2022-03-26 LAB — MICROSCOPIC EXAMINATION
RBC, Urine: NONE SEEN /hpf (ref 0–2)
Renal Epithel, UA: NONE SEEN /hpf

## 2022-03-26 LAB — WET PREP FOR TRICH, YEAST, CLUE
Clue Cell Exam: NEGATIVE
Trichomonas Exam: NEGATIVE
Yeast Exam: NEGATIVE

## 2022-03-26 MED ORDER — FUROSEMIDE 20 MG PO TABS
20.0000 mg | ORAL_TABLET | Freq: Every day | ORAL | 1 refills | Status: DC
Start: 2022-03-26 — End: 2022-07-23

## 2022-03-26 NOTE — Progress Notes (Signed)
Subjective:    Patient ID: Gabriella Ferguson, female    DOB: 08/13/1929, 88 y.o.   MRN: [id number]  Chief Complaint  Patient presents with   Follow-up   Pt presents to the office today to recheck cellulitis of bilateral lower legs. She as given doxycycline 100 mg BID for 10 days and Lasix 20 mg for 5 days.   Her legs are greatly improved.   She is complaining of UTI symptoms.  Dysuria  This is a new problem. The current episode started 1 to 4 weeks ago. The problem occurs intermittently. The problem has been waxing and waning. The quality of the pain is described as burning. The pain is mild. There has been no fever. Associated symptoms include frequency and urgency. Pertinent negatives include no hematuria, hesitancy, nausea or vomiting. She has tried antibiotics and increased fluids for the symptoms. The treatment provided mild relief.      Review of Systems  Gastrointestinal:  Negative for nausea and vomiting.  Genitourinary:  Positive for dysuria, frequency and urgency. Negative for hematuria and hesitancy.  All other systems reviewed and are negative.      Objective:   Physical Exam Vitals reviewed.  Constitutional:      General: She is not in acute distress.    Appearance: She is well-developed. She is obese.  HENT:     Head: Normocephalic and atraumatic.     Right Ear: Tympanic membrane normal.     Left Ear: Tympanic membrane normal.  Eyes:     Pupils: Pupils are equal, round, and reactive to light.  Neck:     Thyroid: No thyromegaly.  Cardiovascular:     Rate and Rhythm: Normal rate and regular rhythm.     Heart sounds: Normal heart sounds. No murmur heard. Pulmonary:     Effort: Pulmonary effort is normal. No respiratory distress.     Breath sounds: Normal breath sounds. No wheezing.  Abdominal:     General: Bowel sounds are normal. There is no distension.     Palpations: Abdomen is soft.     Tenderness: There is no abdominal tenderness.  Musculoskeletal:         General: Tenderness present.     Cervical back: Normal range of motion and neck supple.     Comments: Pain in lumbar with flexion and extension   Skin:    General: Skin is warm and dry.  Neurological:     Mental Status: She is alert and oriented to person, place, and time.     Cranial Nerves: No cranial nerve deficit.     Motor: Weakness (using cane to walk) present.     Gait: Gait abnormal.     Deep Tendon Reflexes: Reflexes are normal and symmetric.  Psychiatric:        Behavior: Behavior normal.        Thought Content: Thought content normal.        Judgment: Judgment normal.      BP (!) 142/62   Pulse 62   Temp 97.7 F (36.5 C) (Temporal)   Ht '5\' 4"'$  (1.626 m)   Wt 180 lb 3.2 oz (81.7 kg)   SpO2 96%   BMI 30.93 kg/m       Assessment & Plan:  Frederich Balding [place] comes in today with chief complaint of Follow-up   Diagnosis and orders addressed:  1. Recurrent UTI  2. Cellulitis of right lower extremity Greatly improved Continue Lasix 20 mg daily  3. Peripheral edema  Continue Lasix and elevate legs when possible  - furosemide (LASIX) 20 MG tablet; Take 1 tablet (20 mg total) by mouth daily. 40 mg for 5 days  Dispense: 90 tablet; Refill: 1  4. Dysuria - Urinalysis, Complete - WET PREP FOR TRICH, YEAST, CLUE - Urine Culture   Health Maintenance reviewed Diet and exercise encouraged  Follow up plan: 3 months    [name], FNP

## 2022-03-26 NOTE — Patient Instructions (Signed)
Urinary Tract Infection, Adult  A urinary tract infection (UTI) is an infection of any part of the urinary tract. The urinary tract includes the kidneys, ureters, bladder, and urethra. These organs make, store, and get rid of urine in the body. An upper UTI affects the ureters and kidneys. A lower UTI affects the bladder and urethra. What are the causes? Most urinary tract infections are caused by bacteria in your genital area around your urethra, where urine leaves your body. These bacteria grow and cause inflammation of your urinary tract. What increases the risk? You are more likely to develop this condition if: You have a urinary catheter that stays in place. You are not able to control when you urinate or have a bowel movement (incontinence). You are female and you: Use a spermicide or diaphragm for birth control. Have low estrogen levels. Are pregnant. You have certain genes that increase your risk. You are sexually active. You take antibiotic medicines. You have a condition that causes your flow of urine to slow down, such as: An enlarged prostate, if you are female. Blockage in your urethra. A kidney stone. A nerve condition that affects your bladder control (neurogenic bladder). Not getting enough to drink, or not urinating often. You have certain medical conditions, such as: Diabetes. A weak disease-fighting system (immunesystem). Sickle cell disease. Gout. Spinal cord injury. What are the signs or symptoms? Symptoms of this condition include: Needing to urinate right away (urgency). Frequent urination. This may include small amounts of urine each time you urinate. Pain or burning with urination. Blood in the urine. Urine that smells bad or unusual. Trouble urinating. Cloudy urine. Vaginal discharge, if you are female. Pain in the abdomen or the lower back. You may also have: Vomiting or a decreased appetite. Confusion. Irritability or tiredness. A fever or  chills. Diarrhea. The first symptom in older adults may be confusion. In some cases, they may not have any symptoms until the infection has worsened. How is this diagnosed? This condition is diagnosed based on your medical history and a physical exam. You may also have other tests, including: Urine tests. Blood tests. Tests for STIs (sexually transmitted infections). If you have had more than one UTI, a cystoscopy or imaging studies may be done to determine the cause of the infections. How is this treated? Treatment for this condition includes: Antibiotic medicine. Over-the-counter medicines to treat discomfort. Drinking enough water to stay hydrated. If you have frequent infections or have other conditions such as a kidney stone, you may need to see a health care provider who specializes in the urinary tract (urologist). In rare cases, urinary tract infections can cause sepsis. Sepsis is a life-threatening condition that occurs when the body responds to an infection. Sepsis is treated in the hospital with IV antibiotics, fluids, and other medicines. Follow these instructions at home:  Medicines Take over-the-counter and prescription medicines only as told by your health care provider. If you were prescribed an antibiotic medicine, take it as told by your health care provider. Do not stop using the antibiotic even if you start to feel better. General instructions Make sure you: Empty your bladder often and completely. Do not hold urine for long periods of time. Empty your bladder after sex. Wipe from front to back after urinating or having a bowel movement if you are female. Use each tissue only one time when you wipe. Drink enough fluid to keep your urine pale yellow. Keep all follow-up visits. This is important. Contact a health   care provider if: Your symptoms do not get better after 1-2 days. Your symptoms go away and then return. Get help right away if: You have severe pain in  your back or your lower abdomen. You have a fever or chills. You have nausea or vomiting. Summary A urinary tract infection (UTI) is an infection of any part of the urinary tract, which includes the kidneys, ureters, bladder, and urethra. Most urinary tract infections are caused by bacteria in your genital area. Treatment for this condition often includes antibiotic medicines. If you were prescribed an antibiotic medicine, take it as told by your health care provider. Do not stop using the antibiotic even if you start to feel better. Keep all follow-up visits. This is important. This information is not intended to replace advice given to you by your health care provider. Make sure you discuss any questions you have with your health care provider. Document Revised: 03/25/2020 Document Reviewed: 03/25/2020 Elsevier Patient Education  2023 Elsevier Inc.  

## 2022-03-28 LAB — URINE CULTURE

## 2022-03-29 ENCOUNTER — Other Ambulatory Visit: Payer: Self-pay | Admitting: Family

## 2022-03-29 MED ORDER — AMOXICILLIN-POT CLAVULANATE 875-125 MG PO TABS: 1.0000 | ORAL_TABLET | Freq: Two times a day (BID) | ORAL | 0 refills | Status: DC

## 2022-04-12 ENCOUNTER — Other Ambulatory Visit: Payer: Self-pay | Admitting: Family

## 2022-04-12 DIAGNOSIS — N3281 Overactive bladder: Secondary | ICD-10-CM

## 2022-05-05 ENCOUNTER — Other Ambulatory Visit: Payer: Self-pay | Admitting: Family

## 2022-05-05 DIAGNOSIS — G629 Polyneuropathy, unspecified: Secondary | ICD-10-CM

## 2022-05-12 ENCOUNTER — Other Ambulatory Visit: Payer: Self-pay | Admitting: Family

## 2022-05-12 DIAGNOSIS — E1165 Type 2 diabetes mellitus with hyperglycemia: Secondary | ICD-10-CM

## 2022-06-04 ENCOUNTER — Other Ambulatory Visit: Payer: Self-pay | Admitting: Family

## 2022-06-04 DIAGNOSIS — M810 Age-related osteoporosis without current pathological fracture: Secondary | ICD-10-CM

## 2022-06-05 ENCOUNTER — Other Ambulatory Visit: Payer: Self-pay | Admitting: Family

## 2022-06-05 DIAGNOSIS — E1165 Type 2 diabetes mellitus with hyperglycemia: Secondary | ICD-10-CM

## 2022-06-05 DIAGNOSIS — E1169 Type 2 diabetes mellitus with other specified complication: Secondary | ICD-10-CM

## 2022-06-26 ENCOUNTER — Ambulatory Visit: Payer: Medicare Other | Admitting: Family

## 2022-06-29 ENCOUNTER — Ambulatory Visit: Payer: Medicare Other | Admitting: Family

## 2022-07-02 ENCOUNTER — Ambulatory Visit (INDEPENDENT_AMBULATORY_CARE_PROVIDER_SITE_OTHER): Payer: Medicare Other | Admitting: Family

## 2022-07-02 ENCOUNTER — Encounter: Payer: Self-pay | Admitting: Family

## 2022-07-02 VITALS — BP 152/63 | HR 68 | Temp 98.4°F | Ht 64.0 in | Wt 183.0 lb

## 2022-07-02 DIAGNOSIS — Z23 Encounter for immunization: Secondary | ICD-10-CM | POA: Diagnosis not present

## 2022-07-02 DIAGNOSIS — K5900 Constipation, unspecified: Secondary | ICD-10-CM

## 2022-07-02 DIAGNOSIS — Z0000 Encounter for general adult medical examination without abnormal findings: Secondary | ICD-10-CM

## 2022-07-02 DIAGNOSIS — E785 Hyperlipidemia, unspecified: Secondary | ICD-10-CM

## 2022-07-02 DIAGNOSIS — M8000XD Age-related osteoporosis with current pathological fracture, unspecified site, subsequent encounter for fracture with routine healing: Secondary | ICD-10-CM

## 2022-07-02 DIAGNOSIS — Z0001 Encounter for general adult medical examination with abnormal findings: Secondary | ICD-10-CM | POA: Diagnosis not present

## 2022-07-02 DIAGNOSIS — E1169 Type 2 diabetes mellitus with other specified complication: Secondary | ICD-10-CM | POA: Diagnosis not present

## 2022-07-02 DIAGNOSIS — L989 Disorder of the skin and subcutaneous tissue, unspecified: Secondary | ICD-10-CM

## 2022-07-02 DIAGNOSIS — K219 Gastro-esophageal reflux disease without esophagitis: Secondary | ICD-10-CM

## 2022-07-02 DIAGNOSIS — I10 Essential (primary) hypertension: Secondary | ICD-10-CM

## 2022-07-02 DIAGNOSIS — N390 Urinary tract infection, site not specified: Secondary | ICD-10-CM

## 2022-07-02 LAB — BAYER DCA HB A1C WAIVED: HB A1C (BAYER DCA - WAIVED): 8.1 % — ABNORMAL HIGH (ref 4.8–5.6)

## 2022-07-02 NOTE — Progress Notes (Signed)
Subjective:    Patient ID: Gabriella Ferguson, female    DOB: 03/24/1929, 88 y.o.   MRN: [id number]  No chief complaint on file.   Pt presents to the office today for CPE and chronic follow up. She has osteoporosis and taking Fosamax weekly.    She has hx of recurrent UTI and takes Keflex 250 mg daily.   Pt has peripheral edema and takes lasix 20 mg daily.    Complaining of nonhealing skin lesions on her her chin and nose she has had for months.  Hypertension This is a chronic problem. The current episode started more than 1 year ago. The problem has been waxing and waning since onset. The problem is uncontrolled. Associated symptoms include blurred vision, malaise/fatigue and peripheral edema. Pertinent negatives include no shortness of breath. Risk factors for coronary artery disease include dyslipidemia, diabetes mellitus, obesity and sedentary lifestyle. The current treatment provides moderate improvement.  Gastroesophageal Reflux She complains of belching and heartburn. This is a chronic problem. The current episode started more than 1 year ago. The problem occurs occasionally. The symptoms are aggravated by certain foods. Risk factors include obesity. She has tried a diet change for the symptoms. The treatment provided moderate relief.  Hyperlipidemia This is a chronic problem. The current episode started more than 1 year ago. The problem is controlled. Exacerbating diseases include obesity. Pertinent negatives include no shortness of breath. Current antihyperlipidemic treatment includes statins. The current treatment provides moderate improvement of lipids. Risk factors for coronary artery disease include dyslipidemia, diabetes mellitus, hypertension, a sedentary lifestyle and post-menopausal.  Diabetes She presents for her follow-up diabetic visit. She has type 2 diabetes mellitus. Associated symptoms include blurred vision. Symptoms are stable. Diabetic complications include heart  disease and peripheral neuropathy. Risk factors for coronary artery disease include dyslipidemia, diabetes mellitus, hypertension, sedentary lifestyle and post-menopausal. She is following a generally healthy diet. Her overall blood glucose range is 110-130 mg/dl.  Constipation This is a chronic problem. The current episode started more than 1 year ago. The problem has been resolved since onset. Her stool frequency is 1 time per day. Risk factors include obesity. The treatment provided moderate relief.      Review of Systems  Constitutional:  Positive for malaise/fatigue.  Eyes:  Positive for blurred vision.  Respiratory:  Negative for shortness of breath.   Gastrointestinal:  Positive for constipation and heartburn.  All other systems reviewed and are negative.   Family History  Problem Relation Age of Onset   Diabetes Daughter    Stroke Mother    Stroke Father    Social History   Socioeconomic History   Marital status: Widowed    Spouse name: Not on file   Number of children: 2   Years of education: Not on file   Highest education level: 10th grade  Occupational History   Occupation: Retired  Tobacco Use   Smoking status: Never   Smokeless tobacco: Never  Vaping Use   Vaping Use: Never used  Substance and Sexual Activity   Alcohol use: No   Drug use: No   Sexual activity: Not Currently  Other Topics Concern   Not on file  Social History Narrative   Lives alone in senior apartments ([place]) on ground level   Daughter visits daily and helps with medication and diabetes.    Family members take her to appts and shopping.   Social Determinants of Health   Financial Resource Strain: Low Risk  (02/13/2022)  Overall Financial Resource Strain (CARDIA)    Difficulty of Paying Living Expenses: Not hard at all  Food Insecurity: No Food Insecurity (02/13/2022)   Hunger Vital Sign    Worried About Running Out of Food in the Last Year: Never true    Ran Out of Food in the  Last Year: Never true  Transportation Needs: No Transportation Needs (02/13/2022)   PRAPARE - Hydrologist (Medical): No    Lack of Transportation (Non-Medical): No  Physical Activity: Insufficiently Active (02/13/2022)   Exercise Vital Sign    Days of Exercise per Week: 7 days    Minutes of Exercise per Session: 10 min  Stress: No Stress Concern Present (02/13/2022)   [place]    Feeling of Stress : Not at all  Social Connections: Moderately Integrated (02/13/2022)   Social Connection and Isolation Panel [NHANES]    Frequency of Communication with Friends and Family: More than three times a week    Frequency of Social Gatherings with Friends and Family: More than three times a week    Attends Religious Services: More than 4 times per year    Active Member of [employer] or Organizations: Yes    Attends Archivist Meetings: More than 4 times per year    Marital Status: Widowed       Objective:   Physical Exam Vitals reviewed.  Constitutional:      General: She is not in acute distress.    Appearance: She is well-developed. She is obese.  HENT:     Head: Normocephalic and atraumatic.     Right Ear: Tympanic membrane normal.     Left Ear: Tympanic membrane normal.  Eyes:     Pupils: Pupils are equal, round, and reactive to light.  Neck:     Thyroid: No thyromegaly.  Cardiovascular:     Rate and Rhythm: Normal rate and regular rhythm.     Heart sounds: Normal heart sounds. No murmur heard. Pulmonary:     Effort: Pulmonary effort is normal. No respiratory distress.     Breath sounds: Normal breath sounds. No wheezing.  Abdominal:     General: Bowel sounds are normal. There is no distension.     Palpations: Abdomen is soft.     Tenderness: There is no abdominal tenderness.  Musculoskeletal:        General: No tenderness. Normal range of motion.     Cervical back: Normal range of  motion and neck supple.     Right lower leg: Edema (3+) present.     Left lower leg: Edema (3+) present.  Skin:    General: Skin is warm and dry.          Comments: Skin lesion on nose and chin  Neurological:     Mental Status: She is alert and oriented to person, place, and time.     Cranial Nerves: No cranial nerve deficit.     Deep Tendon Reflexes: Reflexes are normal and symmetric.  Psychiatric:        Behavior: Behavior normal.        Thought Content: Thought content normal.        Judgment: Judgment normal.       BP (!) 152/63   Pulse 68   Temp 98.4 F (36.9 C) (Temporal)   Ht _0  (1.626 m)   Wt 183 lb (83 kg)   SpO2 97%   BMI 31.41  kg/m      Assessment & Plan:   Ky Moskowitz Jock comes in today with chief complaint of No chief complaint on file.   Diagnosis and orders addressed:  1. Need for immunization against influenza - Flu Vaccine QUAD High Dose(Fluad) - CMP14+EGFR - CBC with Differential/Platelet  2. Essential hypertension - CMP14+EGFR - CBC with Differential/Platelet  3. Type 2 diabetes mellitus with other specified complication, without long-term current use of insulin (HCC) - CMP14+EGFR - CBC with Differential/Platelet - Bayer DCA Hb A1c Waived  4. Hyperlipidemia associated with type 2 diabetes mellitus ([place]) - CMP14+EGFR - CBC with Differential/Platelet - Lipid panel  5. Osteoporosis with current pathological fracture with routine healing, unspecified osteoporosis type, subsequent encounter - CMP14+EGFR - CBC with Differential/Platelet  6. Constipation, unspecified constipation type - CMP14+EGFR - CBC with Differential/Platelet  7. Gastroesophageal reflux disease, unspecified whether esophagitis present - CMP14+EGFR - CBC with Differential/Platelet  8. Recurrent UTI - CMP14+EGFR - CBC with Differential/Platelet  9. Face lesion -Worrisome for basal cell vs squamous cell Referral to Dermatologists   - Ambulatory referral  to Dermatology - CMP14+EGFR - CBC with Differential/Platelet  10. Annual physical exam - CMP14+EGFR - CBC with Differential/Platelet - Bayer DCA Hb A1c Waived - Lipid panel - TSH   Labs pending Health Maintenance reviewed Diet and exercise encouraged  Follow up plan: 3 months    [name], FNP

## 2022-07-02 NOTE — Patient Instructions (Signed)
Basal Cell Carcinoma Basal cell carcinoma is the most common form of skin cancer. It begins in the basal cells, which are at the bottom of the outer skin layer (epidermis). Basal cell carcinoma can often be cured. It rarely spreads to other areas of the body (metastasizes). It may come back at the same location (recur), but it can be treated again if this happens. Basal cell carcinoma occurs most often on parts of the body that are frequently exposed to the sun, such as: Parts of the head, including the scalp or face. Ears. Neck. Arms or legs. Backs of the hands. What are the causes? This condition is usually caused by exposure to ultraviolet (UV) light. UV light may come from the sun or from tanning beds. Other causes include: Exposure to arsenic, a highly poisonous metal. Exposure to high-energy X-rays (radiation). Exposure to toxic tars and oils. Certain genetic conditions, such as a condition that makes a person sensitive to sunlight. What increases the risk? You are more likely to develop this condition if: You are older than 86 years of age. You have: Fair skin (light complexion), blond or red hair, or blue, green, or gray eye color. Childhood freckling. Had repeated sunburns or sun exposure over long periods of time, especially during childhood. A weakened body defense system (immune system). Been exposed to certain chemicals, such as tar, soot, and arsenic. Chronic inflammatory conditions or infections. A family or personal history of basal cell carcinoma. You use tanning beds. What are the signs or symptoms? The main symptom of this condition is a growth or lesion on the skin. The shape and color of the growth or lesion may vary. The main types include: An open sore that may remain open for three weeks or longer. The sore may bleed or crust. This type of lesion can be an early sign of basal cell carcinoma. Basal cell carcinoma often shows up as a sore that does not heal. A  reddish area that may crust, itch, or cause discomfort. This may occur on areas that are exposed to the sun. These patches might be easier to feel than to see. A shiny or clear bump that is red, white, or pink. In people who have dark hair, the bump is often tan, black, or brown. These bumps can look like moles. A pink growth with a raised border. The growth will have a crusted and indented area in the center. Small blood vessels may appear on the surface of the growth as it gets bigger. A scar-like area that looks like shiny, stretched skin. The area may be white, yellow, or waxy. It often has irregular borders. This may be a sign of more aggressive basal cell carcinoma. How is this diagnosed? This condition may be diagnosed with: A physical exam. Removal of a tissue sample to be examined under a microscope (biopsy). How is this treated? Treatment for this condition involves removing the cancerous tissue. The method that is used for this depends on the type, size, location, and number of tumors. Possible treatments include: Surgery, such as: Mohs surgery. In this procedure, the cancerous skin cells are removed layer by layer until all of the tumor has been removed. Surgical removal (excision) of the tumor. This involves removing the entire tumor and a small amount of normal skin that surrounds it. Cryosurgery. This involves freezing the tumor with liquid nitrogen. Plastic surgery. The tumor is removed, and healthy skin from another part of the body is used to cover the wound. This   may be done for large tumors that are in areas where it is not possible to stretch the nearby skin to sew the edges of the wound together. Therapies or treatments, such as: Radiation. This may be used for tumors on the face. Photodynamic therapy. A chemical cream is applied to the skin, and light exposure is used to activate the chemical. Electrodesiccation and curettage. This involves alternately scraping and burning  the tumor while using an electric current to control bleeding. Chemical treatments, such as imiquimod cream and interferon injections. These may be used to remove superficial tumors with minimal scarring. Follow these instructions at home: Avoid direct exposure to the sun. Do self-exams as told by your health care provider. Look for new spots or changes in your skin. Keep all follow-up visits. This is important. How is this prevented?  Avoid the sun when it is the strongest. This is usually between 10 a.m. and 4 p.m. When you are out in the sun, use a sunscreen that has a sun protection factor (SPF) of at least 30. Apply sunscreen at least 30 minutes before exposure to the sun. Reapply sunscreen every 2-4 hours while you are outside. Also reapply it after swimming and after excessive sweating. Always wear hats, protective clothing, and UV-blocking sunglasses when you are outdoors. Do not use tanning beds. Contact a health care provider if: You notice any new spots or any changes in your skin. You have had a basal cell carcinoma tumor removed, and you notice a new growth in the same location. Get help right away if: You have a spot that is sore and does not heal. You have a spot that bleeds easily. Summary Basal cell carcinoma is the most common form of skin cancer. It begins in the bottom of the outer skin layer (epidermis). Basal cell carcinoma can almost always be cured. This condition is usually caused by exposure to ultraviolet (UV) light. It mostly affects the face, scalp, neck, ears, arms, legs, or backs of the hands. The main symptom of this condition is a growth or lesion on the skin that can vary in shape and color. You can prevent this cancer by avoiding direct exposure to the sun, applying sunscreen of at least 30 SPF, and wearing protective clothing. Apply sunscreen 30 minutes before you go out into the sun, and reapply every 2-4 hours while you are outside. This information is  not intended to replace advice given to you by your health care provider. Make sure you discuss any questions you have with your health care provider. Document Revised: 12/15/2020 Document Reviewed: 12/15/2020 Elsevier Patient Education  2023 Elsevier Inc.  

## 2022-07-03 ENCOUNTER — Other Ambulatory Visit: Payer: Self-pay | Admitting: Family

## 2022-07-03 DIAGNOSIS — N3281 Overactive bladder: Secondary | ICD-10-CM

## 2022-07-03 LAB — CMP14+EGFR
ALT: 10 IU/L (ref 0–32)
AST: 12 IU/L (ref 0–40)
Albumin/Globulin Ratio: 1.5 (ref 1.2–2.2)
Albumin: 3.9 g/dL (ref 3.6–4.6)
Alkaline Phosphatase: 57 IU/L (ref 44–121)
BUN/Creatinine Ratio: 14 (ref 12–28)
BUN: 16 mg/dL (ref 10–36)
Bilirubin Total: 0.2 mg/dL (ref 0.0–1.2)
CO2: 25 mmol/L (ref 20–29)
Calcium: 9.1 mg/dL (ref 8.7–10.3)
Chloride: 106 mmol/L (ref 96–106)
Creatinine, Ser: 1.16 mg/dL — ABNORMAL HIGH (ref 0.57–1.00)
Globulin, Total: 2.6 g/dL (ref 1.5–4.5)
Glucose: 129 mg/dL — ABNORMAL HIGH (ref 70–99)
Potassium: 4.2 mmol/L (ref 3.5–5.2)
Sodium: 144 mmol/L (ref 134–144)
Total Protein: 6.5 g/dL (ref 6.0–8.5)
eGFR: 45 mL/min/{1.73_m2} — ABNORMAL LOW (ref >59)

## 2022-07-03 LAB — CBC WITH DIFFERENTIAL/PLATELET
Basophils Absolute: 0 10*3/uL (ref 0.0–0.2)
Basos: 0 %
EOS (ABSOLUTE): 0.1 10*3/uL (ref 0.0–0.4)
Eos: 2 %
Hematocrit: 34 % (ref 34.0–46.6)
Hemoglobin: 11 g/dL — ABNORMAL LOW (ref 11.1–15.9)
Immature Grans (Abs): 0 10*3/uL (ref 0.0–0.1)
Immature Granulocytes: 0 %
Lymphocytes Absolute: 3.3 10*3/uL — ABNORMAL HIGH (ref 0.7–3.1)
Lymphs: 38 %
MCH: 28.4 pg (ref 26.6–33.0)
MCHC: 32.4 g/dL (ref 31.5–35.7)
MCV: 88 fL (ref 79–97)
Monocytes Absolute: 1 10*3/uL — ABNORMAL HIGH (ref 0.1–0.9)
Monocytes: 11 %
Neutrophils Absolute: 4.3 10*3/uL (ref 1.4–7.0)
Neutrophils: 49 %
Platelets: 262 10*3/uL (ref 150–450)
RBC: 3.88 x10E6/uL (ref 3.77–5.28)
RDW: 12.9 % (ref 11.7–15.4)
WBC: 8.7 10*3/uL (ref 3.4–10.8)

## 2022-07-03 LAB — LIPID PANEL
Chol/HDL Ratio: 3 ratio (ref 0.0–4.4)
Cholesterol, Total: 150 mg/dL (ref 100–199)
HDL: 50 mg/dL (ref >39)
LDL Chol Calc (NIH): 75 mg/dL (ref 0–99)
Triglycerides: 145 mg/dL (ref 0–149)
VLDL Cholesterol Cal: 25 mg/dL (ref 5–40)

## 2022-07-03 LAB — TSH: TSH: 4.23 u[IU]/mL (ref 0.450–4.500)

## 2022-07-03 MED ORDER — RYBELSUS 14 MG PO TABS: 14.0000 mg | ORAL_TABLET | Freq: Every day | ORAL | 1 refills | Status: DC

## 2022-07-04 ENCOUNTER — Telehealth: Payer: Self-pay | Admitting: Family

## 2022-07-05 ENCOUNTER — Other Ambulatory Visit: Payer: Self-pay | Admitting: Family

## 2022-07-23 ENCOUNTER — Telehealth: Payer: Self-pay | Admitting: Family

## 2022-07-23 ENCOUNTER — Other Ambulatory Visit: Payer: Self-pay | Admitting: Family

## 2022-07-23 DIAGNOSIS — N390 Urinary tract infection, site not specified: Secondary | ICD-10-CM

## 2022-07-23 DIAGNOSIS — R609 Edema, unspecified: Secondary | ICD-10-CM

## 2022-07-23 NOTE — Telephone Encounter (Signed)
Lmtcb.

## 2022-07-23 NOTE — Telephone Encounter (Signed)
Pts daughter called stating that PCP recently increased pts dosage of Rybelsus from '7mg'$  to 14 mg and says pt has been complaining about feeling dizzy ever since she started new dosage. Believes she could be having side affect from change in dosage. Needs advise.

## 2022-07-23 NOTE — Telephone Encounter (Signed)
Reviewed Christys note with patients daughter. Daughter voiced understanding.

## 2022-07-23 NOTE — Telephone Encounter (Signed)
Continue the Rybelsus 14 mg and stop the glipizide 2.5 mg. Keep chronic follow up.

## 2022-08-04 ENCOUNTER — Other Ambulatory Visit: Payer: Self-pay | Admitting: Family

## 2022-08-04 DIAGNOSIS — G629 Polyneuropathy, unspecified: Secondary | ICD-10-CM

## 2022-08-11 ENCOUNTER — Other Ambulatory Visit: Payer: Self-pay | Admitting: Family

## 2022-08-11 DIAGNOSIS — E1165 Type 2 diabetes mellitus with hyperglycemia: Secondary | ICD-10-CM

## 2022-08-24 ENCOUNTER — Other Ambulatory Visit: Payer: Self-pay | Admitting: Family

## 2022-08-24 DIAGNOSIS — E1165 Type 2 diabetes mellitus with hyperglycemia: Secondary | ICD-10-CM

## 2022-08-28 ENCOUNTER — Other Ambulatory Visit: Payer: Self-pay | Admitting: Family

## 2022-08-28 DIAGNOSIS — M810 Age-related osteoporosis without current pathological fracture: Secondary | ICD-10-CM

## 2022-08-30 DIAGNOSIS — C44311 Basal cell carcinoma of skin of nose: Secondary | ICD-10-CM | POA: Diagnosis not present

## 2022-08-30 DIAGNOSIS — C44319 Basal cell carcinoma of skin of other parts of face: Secondary | ICD-10-CM | POA: Diagnosis not present

## 2022-08-30 DIAGNOSIS — C44529 Squamous cell carcinoma of skin of other part of trunk: Secondary | ICD-10-CM | POA: Diagnosis not present

## 2022-09-03 ENCOUNTER — Other Ambulatory Visit: Payer: Self-pay | Admitting: Family

## 2022-09-03 DIAGNOSIS — N3281 Overactive bladder: Secondary | ICD-10-CM

## 2022-09-03 DIAGNOSIS — E1169 Type 2 diabetes mellitus with other specified complication: Secondary | ICD-10-CM

## 2022-09-03 DIAGNOSIS — E1165 Type 2 diabetes mellitus with hyperglycemia: Secondary | ICD-10-CM

## 2022-09-04 ENCOUNTER — Ambulatory Visit: Payer: Medicare Other | Admitting: Family

## 2022-09-20 ENCOUNTER — Ambulatory Visit: Payer: Medicare Other | Admitting: Family

## 2022-10-02 ENCOUNTER — Ambulatory Visit: Payer: Medicare Other | Admitting: Family

## 2022-10-05 DIAGNOSIS — C44319 Basal cell carcinoma of skin of other parts of face: Secondary | ICD-10-CM | POA: Diagnosis not present

## 2022-10-05 DIAGNOSIS — C44311 Basal cell carcinoma of skin of nose: Secondary | ICD-10-CM | POA: Diagnosis not present

## 2022-10-15 ENCOUNTER — Ambulatory Visit: Payer: Medicare Other | Admitting: Family

## 2022-10-20 ENCOUNTER — Other Ambulatory Visit: Payer: Self-pay | Admitting: Family

## 2022-10-20 DIAGNOSIS — R609 Edema, unspecified: Secondary | ICD-10-CM

## 2022-10-30 ENCOUNTER — Ambulatory Visit: Payer: Medicare Other | Admitting: Family

## 2022-10-31 ENCOUNTER — Other Ambulatory Visit: Payer: Self-pay | Admitting: Family

## 2022-10-31 DIAGNOSIS — E1165 Type 2 diabetes mellitus with hyperglycemia: Secondary | ICD-10-CM

## 2022-10-31 DIAGNOSIS — G629 Polyneuropathy, unspecified: Secondary | ICD-10-CM

## 2022-11-02 ENCOUNTER — Ambulatory Visit: Payer: Medicare Other | Admitting: Family

## 2022-11-05 ENCOUNTER — Encounter: Payer: Self-pay | Admitting: Family

## 2022-11-05 ENCOUNTER — Ambulatory Visit (INDEPENDENT_AMBULATORY_CARE_PROVIDER_SITE_OTHER): Payer: Medicare Other | Admitting: Family

## 2022-11-05 VITALS — BP 149/69 | HR 69 | Temp 98.6°F | Ht 64.0 in | Wt 178.4 lb

## 2022-11-05 DIAGNOSIS — K5900 Constipation, unspecified: Secondary | ICD-10-CM | POA: Diagnosis not present

## 2022-11-05 DIAGNOSIS — E1169 Type 2 diabetes mellitus with other specified complication: Secondary | ICD-10-CM | POA: Diagnosis not present

## 2022-11-05 DIAGNOSIS — E785 Hyperlipidemia, unspecified: Secondary | ICD-10-CM

## 2022-11-05 DIAGNOSIS — N1831 Chronic kidney disease, stage 3a: Secondary | ICD-10-CM | POA: Insufficient documentation

## 2022-11-05 DIAGNOSIS — M8000XD Age-related osteoporosis with current pathological fracture, unspecified site, subsequent encounter for fracture with routine healing: Secondary | ICD-10-CM

## 2022-11-05 DIAGNOSIS — N390 Urinary tract infection, site not specified: Secondary | ICD-10-CM | POA: Diagnosis not present

## 2022-11-05 DIAGNOSIS — R300 Dysuria: Secondary | ICD-10-CM

## 2022-11-05 DIAGNOSIS — I10 Essential (primary) hypertension: Secondary | ICD-10-CM

## 2022-11-05 LAB — URINALYSIS, COMPLETE
Bilirubin, UA: NEGATIVE
Nitrite, UA: POSITIVE — AB
Specific Gravity, UA: 1.02 (ref 1.005–1.030)
Urobilinogen, Ur: 0.2 mg/dL (ref 0.2–1.0)
pH, UA: 5.5 (ref 5.0–7.5)

## 2022-11-05 LAB — MICROSCOPIC EXAMINATION
Epithelial Cells (non renal): NONE SEEN /hpf (ref 0–10)
Renal Epithel, UA: NONE SEEN /hpf
WBC, UA: >30 /hpf — AB (ref 0–5)

## 2022-11-05 LAB — BAYER DCA HB A1C WAIVED: HB A1C (BAYER DCA - WAIVED): 7.2 % — ABNORMAL HIGH (ref 4.8–5.6)

## 2022-11-05 NOTE — Patient Instructions (Signed)

## 2022-11-05 NOTE — Progress Notes (Addendum)
Subjective:    Patient ID: Gabriella Ferguson, female    DOB: October 19, 1928, 89 y.o.   MRN: [id number]  Chief Complaint  Patient presents with   Medical Management of Chronic Issues    Daughter was making sure she is taking glipzide. They do not know the names of all medication daughter is keeping up with it.   Pt presents to the office today for  chronic follow up. She has osteoporosis and taking Fosamax weekly.    She has hx of recurrent UTI and takes Keflex 250 mg daily.    Pt has peripheral edema and takes lasix 20 mg daily.    She is followed by Dermatologists for skin cancer.  Hypertension This is a chronic problem. The current episode started more than 1 year ago. The problem has been waxing and waning since onset. The problem is uncontrolled. Associated symptoms include malaise/fatigue and peripheral edema. Pertinent negatives include no blurred vision or shortness of breath. Risk factors for coronary artery disease include diabetes mellitus, dyslipidemia, obesity and sedentary lifestyle. The current treatment provides mild improvement.  Diabetes She presents for her follow-up diabetic visit. She has type 2 diabetes mellitus. Pertinent negatives for diabetes include no blurred vision and no foot paresthesias. Symptoms are stable. Diabetic complications include peripheral neuropathy. Risk factors for coronary artery disease include dyslipidemia, diabetes mellitus, hypertension, sedentary lifestyle and post-menopausal. She is following a generally unhealthy diet. Her overall blood glucose range is 140-180 mg/dl.  Hyperlipidemia This is a chronic problem. The current episode started more than 1 year ago. The problem is controlled. Recent lipid tests were reviewed and are normal. Exacerbating diseases include obesity. Pertinent negatives include no shortness of breath. Current antihyperlipidemic treatment includes statins. The current treatment provides moderate improvement of lipids. Risk  factors for coronary artery disease include dyslipidemia, diabetes mellitus, hypertension, a sedentary lifestyle and post-menopausal.  Dysuria  This is a recurrent problem. The current episode started 1 to 4 weeks ago. The problem occurs intermittently. The problem has been waxing and waning. The quality of the pain is described as burning. The patient is experiencing no pain.      Review of Systems  Constitutional:  Positive for malaise/fatigue.  Eyes:  Negative for blurred vision.  Respiratory:  Negative for shortness of breath.   Genitourinary:  Positive for dysuria.  All other systems reviewed and are negative.      Objective:   Physical Exam Vitals reviewed.  Constitutional:      General: She is not in acute distress.    Appearance: She is well-developed. She is obese.  HENT:     Head: Normocephalic and atraumatic.     Right Ear: Tympanic membrane normal.     Left Ear: Tympanic membrane normal.  Eyes:     Pupils: Pupils are equal, round, and reactive to light.  Neck:     Thyroid: No thyromegaly.  Cardiovascular:     Rate and Rhythm: Normal rate and regular rhythm.     Heart sounds: Normal heart sounds. No murmur heard. Pulmonary:     Effort: Pulmonary effort is normal. No respiratory distress.     Breath sounds: Normal breath sounds. No wheezing.  Abdominal:     General: Bowel sounds are normal. There is no distension.     Palpations: Abdomen is soft.     Tenderness: There is no abdominal tenderness.  Musculoskeletal:        General: No tenderness. Normal range of motion.     Cervical  back: Normal range of motion and neck supple.     Right lower leg: Edema (3+) present.     Left lower leg: Edema (3+) present.  Skin:    General: Skin is warm and dry.  Neurological:     Mental Status: She is alert and oriented to person, place, and time.     Cranial Nerves: No cranial nerve deficit.     Deep Tendon Reflexes: Reflexes are normal and symmetric.  Psychiatric:         Behavior: Behavior normal.        Thought Content: Thought content normal.        Judgment: Judgment normal.      BP (!) 149/69   Pulse 69   Temp 98.6 F (37 C) (Temporal)   Ht '5\' 4"'$  (1.626 m)   Wt 178 lb 6.4 oz (80.9 kg)   SpO2 98%   BMI 30.62 kg/m      Assessment & Plan:  [name] comes in today with chief complaint of Medical Management of Chronic Issues (Daughter was making sure she is taking glipzide. They do not know the names of all medication daughter is keeping up with it.)   Diagnosis and orders addressed:  1. Essential hypertension - CMP14+EGFR  2. Type 2 diabetes mellitus with other specified complication, without long-term current use of insulin (HCC) - Bayer DCA Hb A1c Waived - CMP14+EGFR  3. Recurrent UTI - CMP14+EGFR - Urinalysis, Complete  4. Hyperlipidemia associated with type 2 diabetes mellitus (HCC) - CMP14+EGFR  5. Constipation, unspecified constipation type - CMP14+EGFR  6. Osteoporosis with current pathological fracture with routine healing, unspecified osteoporosis type, subsequent encounter - CMP14+EGFR  7. Stage 3a chronic kidney disease ([place]) - CMP14+EGFR  8. Dysuria - Urinalysis, Complete   Labs pending Health Maintenance reviewed Diet and exercise encouraged  Follow up plan: 3 months    [name], FNP

## 2022-11-06 ENCOUNTER — Other Ambulatory Visit: Payer: Self-pay | Admitting: Family

## 2022-11-06 LAB — CMP14+EGFR
ALT: 11 IU/L (ref 0–32)
AST: 15 IU/L (ref 0–40)
Albumin/Globulin Ratio: 1.4 (ref 1.2–2.2)
Albumin: 4 g/dL (ref 3.6–4.6)
Alkaline Phosphatase: 62 IU/L (ref 44–121)
BUN/Creatinine Ratio: 14 (ref 12–28)
BUN: 18 mg/dL (ref 10–36)
Bilirubin Total: 0.2 mg/dL (ref 0.0–1.2)
CO2: 26 mmol/L (ref 20–29)
Calcium: 9.3 mg/dL (ref 8.7–10.3)
Chloride: 101 mmol/L (ref 96–106)
Creatinine, Ser: 1.26 mg/dL — ABNORMAL HIGH (ref 0.57–1.00)
Globulin, Total: 2.8 g/dL (ref 1.5–4.5)
Glucose: 164 mg/dL — ABNORMAL HIGH (ref 70–99)
Potassium: 4.4 mmol/L (ref 3.5–5.2)
Sodium: 140 mmol/L (ref 134–144)
Total Protein: 6.8 g/dL (ref 6.0–8.5)
eGFR: 40 mL/min/{1.73_m2} — ABNORMAL LOW (ref >59)

## 2022-11-06 MED ORDER — CEPHALEXIN 500 MG PO CAPS: 500.0000 mg | ORAL_CAPSULE | Freq: Two times a day (BID) | ORAL | 0 refills | Status: DC

## 2022-11-19 ENCOUNTER — Other Ambulatory Visit: Payer: Self-pay | Admitting: Family

## 2022-11-19 DIAGNOSIS — M810 Age-related osteoporosis without current pathological fracture: Secondary | ICD-10-CM

## 2022-11-20 ENCOUNTER — Other Ambulatory Visit: Payer: Self-pay | Admitting: Family

## 2022-11-20 DIAGNOSIS — R609 Edema, unspecified: Secondary | ICD-10-CM

## 2022-11-23 DIAGNOSIS — C44319 Basal cell carcinoma of skin of other parts of face: Secondary | ICD-10-CM | POA: Diagnosis not present

## 2022-11-30 ENCOUNTER — Other Ambulatory Visit: Payer: Self-pay | Admitting: Family

## 2022-11-30 DIAGNOSIS — N3281 Overactive bladder: Secondary | ICD-10-CM

## 2022-12-01 ENCOUNTER — Other Ambulatory Visit: Payer: Self-pay | Admitting: Family

## 2022-12-01 DIAGNOSIS — E1169 Type 2 diabetes mellitus with other specified complication: Secondary | ICD-10-CM

## 2022-12-01 DIAGNOSIS — E1165 Type 2 diabetes mellitus with hyperglycemia: Secondary | ICD-10-CM

## 2023-01-29 ENCOUNTER — Other Ambulatory Visit: Payer: Self-pay | Admitting: Family

## 2023-01-29 DIAGNOSIS — E1165 Type 2 diabetes mellitus with hyperglycemia: Secondary | ICD-10-CM

## 2023-01-29 DIAGNOSIS — G629 Polyneuropathy, unspecified: Secondary | ICD-10-CM

## 2023-02-07 ENCOUNTER — Ambulatory Visit: Payer: Medicare Other | Admitting: Family

## 2023-02-15 ENCOUNTER — Ambulatory Visit (INDEPENDENT_AMBULATORY_CARE_PROVIDER_SITE_OTHER): Payer: Medicare Other

## 2023-02-15 VITALS — Ht 64.0 in | Wt 178.0 lb

## 2023-02-15 DIAGNOSIS — Z0000 Encounter for general adult medical examination without abnormal findings: Secondary | ICD-10-CM

## 2023-02-15 DIAGNOSIS — Z0100 Encounter for examination of eyes and vision without abnormal findings: Secondary | ICD-10-CM

## 2023-02-15 NOTE — Patient Instructions (Signed)
Ms. Curenton , Thank you for taking time to come for your Medicare Wellness Visit. I appreciate your ongoing commitment to your health goals. Please review the following plan we discussed and let me know if I can assist you in the future.   These are the goals we discussed:  Goals      Diabetes Management     Current Barriers:  Chronic Disease Management support and education needs related to diabetes Unable/unwilling to inject insulin or check blood sugar herself  Nurse Case Manager Clinical Goal(s):  Over the next 30 days, patient will work with diabetes educator to address needs related to diabetes management  Interventions:  Evaluation of current treatment plan related to diabetes and patient's adherence to plan as established by provider. Reviewed medications with patient and discussed amaryl, metformin, januvia Collaborated with Jannifer Rodney, FNP regarding setting her up to talk with diabetes educator by telephone since she is unable/unwilling to drive to appointments Discussed plans with patient for ongoing care management follow up and provided patient with direct contact information for care management team Advised patient, providing education and rationale, to check cbg daily and record, calling [phone] for findings outside established parameters.    Patient Self Care Activities:  Performs ADL's independently Performs IADL's independently Has assistance from daughter with medication management   Initial goal documentation      DIET - INCREASE WATER INTAKE     Exercise 150 min/wk Moderate Activity        This is a list of the screening recommended for you and due dates:  Health Maintenance  Topic Date Due   Eye exam for diabetics  10/08/2019   COVID-19 Vaccine (4 - 2023-24 season) 04/27/2022   Complete foot exam   01/26/2023   Flu Shot  03/28/2023   Hemoglobin A1C  05/08/2023   Medicare Annual Wellness Visit  02/15/2024   DTaP/Tdap/Td vaccine (2 - Td or  Tdap) 01/01/2027   Pneumonia Vaccine  Completed   Zoster (Shingles) Vaccine  Completed   HPV Vaccine  Aged Out   DEXA scan (bone density measurement)  Discontinued    Advanced directives: Please bring a copy of your health care power of attorney and living will to the office to be added to your chart at your convenience.   Conditions/risks identified: Aim for 30 minutes of exercise or brisk walking, 6-8 glasses of water, and 5 servings of fruits and vegetables each day.   Next appointment: Follow up in one year for your annual wellness visit    Preventive Care 65 Years and Older, Female Preventive care refers to lifestyle choices and visits with your health care provider that can promote health and wellness. What does preventive care include? A yearly physical exam. This is also called an annual well check. Dental exams once or twice a year. Routine eye exams. Ask your health care provider how often you should have your eyes checked. Personal lifestyle choices, including: Daily care of your teeth and gums. Regular physical activity. Eating a healthy diet. Avoiding tobacco and drug use. Limiting alcohol use. Practicing safe sex. Taking low-dose aspirin every day. Taking vitamin and mineral supplements as recommended by your health care provider. What happens during an annual well check? The services and screenings done by your health care provider during your annual well check will depend on your age, overall health, lifestyle risk factors, and family history of disease. Counseling  Your health care provider may ask you questions about your: Alcohol use. Tobacco use.  Drug use. Emotional well-being. Home and relationship well-being. Sexual activity. Eating habits. History of falls. Memory and ability to understand (cognition). Work and work Astronomer. Reproductive health. Screening  You may have the following tests or measurements: Height, weight, and BMI. Blood  pressure. Lipid and cholesterol levels. These may be checked every 5 years, or more frequently if you are over 64 years old. Skin check. Lung cancer screening. You may have this screening every year starting at age 27 if you have a 30-pack-year history of smoking and currently smoke or have quit within the past 15 years. Fecal occult blood test (FOBT) of the stool. You may have this test every year starting at age 86. Flexible sigmoidoscopy or colonoscopy. You may have a sigmoidoscopy every 5 years or a colonoscopy every 10 years starting at age 85. Hepatitis C blood test. Hepatitis B blood test. Sexually transmitted disease (STD) testing. Diabetes screening. This is done by checking your blood sugar (glucose) after you have not eaten for a while (fasting). You may have this done every 1-3 years. Bone density scan. This is done to screen for osteoporosis. You may have this done starting at age 72. Mammogram. This may be done every 1-2 years. Talk to your health care provider about how often you should have regular mammograms. Talk with your health care provider about your test results, treatment options, and if necessary, the need for more tests. Vaccines  Your health care provider may recommend certain vaccines, such as: Influenza vaccine. This is recommended every year. Tetanus, diphtheria, and acellular pertussis (Tdap, Td) vaccine. You may need a Td booster every 10 years. Zoster vaccine. You may need this after age 45. Pneumococcal 13-valent conjugate (PCV13) vaccine. One dose is recommended after age 24. Pneumococcal polysaccharide (PPSV23) vaccine. One dose is recommended after age 34. Talk to your health care provider about which screenings and vaccines you need and how often you need them. This information is not intended to replace advice given to you by your health care provider. Make sure you discuss any questions you have with your health care provider. Document Released:  09/09/2015 Document Revised: 05/02/2016 Document Reviewed: 06/14/2015 Elsevier Interactive Patient Education  2017 [employer].  Fall Prevention in the Home Falls can cause injuries. They can happen to people of all ages. There are many things you can do to make your home safe and to help prevent falls. What can I do on the outside of my home? Regularly fix the edges of walkways and driveways and fix any cracks. Remove anything that might make you trip as you walk through a door, such as a raised step or threshold. Trim any bushes or trees on the path to your home. Use bright outdoor lighting. Clear any walking paths of anything that might make someone trip, such as rocks or tools. Regularly check to see if handrails are loose or broken. Make sure that both sides of any steps have handrails. Any raised decks and porches should have guardrails on the edges. Have any leaves, snow, or ice cleared regularly. Use sand or salt on walking paths during winter. Clean up any spills in your garage right away. This includes oil or grease spills. What can I do in the bathroom? Use night lights. Install grab bars by the toilet and in the tub and shower. Do not use towel bars as grab bars. Use non-skid mats or decals in the tub or shower. If you need to sit down in the shower, use a plastic,  non-slip stool. Keep the floor dry. Clean up any water that spills on the floor as soon as it happens. Remove soap buildup in the tub or shower regularly. Attach bath mats securely with double-sided non-slip rug tape. Do not have throw rugs and other things on the floor that can make you trip. What can I do in the bedroom? Use night lights. Make sure that you have a light by your bed that is easy to reach. Do not use any sheets or blankets that are too big for your bed. They should not hang down onto the floor. Have a firm chair that has side arms. You can use this for support while you get dressed. Do not have  throw rugs and other things on the floor that can make you trip. What can I do in the kitchen? Clean up any spills right away. Avoid walking on wet floors. Keep items that you use a lot in easy-to-reach places. If you need to reach something above you, use a strong step stool that has a grab bar. Keep electrical cords out of the way. Do not use floor polish or wax that makes floors slippery. If you must use wax, use non-skid floor wax. Do not have throw rugs and other things on the floor that can make you trip. What can I do with my stairs? Do not leave any items on the stairs. Make sure that there are handrails on both sides of the stairs and use them. Fix handrails that are broken or loose. Make sure that handrails are as long as the stairways. Check any carpeting to make sure that it is firmly attached to the stairs. Fix any carpet that is loose or worn. Avoid having throw rugs at the top or bottom of the stairs. If you do have throw rugs, attach them to the floor with carpet tape. Make sure that you have a light switch at the top of the stairs and the bottom of the stairs. If you do not have them, ask someone to add them for you. What else can I do to help prevent falls? Wear shoes that: Do not have high heels. Have rubber bottoms. Are comfortable and fit you well. Are closed at the toe. Do not wear sandals. If you use a stepladder: Make sure that it is fully opened. Do not climb a closed stepladder. Make sure that both sides of the stepladder are locked into place. Ask someone to hold it for you, if possible. Clearly mark and make sure that you can see: Any grab bars or handrails. First and last steps. Where the edge of each step is. Use tools that help you move around (mobility aids) if they are needed. These include: Canes. Walkers. Scooters. Crutches. Turn on the lights when you go into a dark area. Replace any light bulbs as soon as they burn out. Set up your furniture so  you have a clear path. Avoid moving your furniture around. If any of your floors are uneven, fix them. If there are any pets around you, be aware of where they are. Review your medicines with your doctor. Some medicines can make you feel dizzy. This can increase your chance of falling. Ask your doctor what other things that you can do to help prevent falls. This information is not intended to replace advice given to you by your health care provider. Make sure you discuss any questions you have with your health care provider. Document Released: 06/09/2009 Document Revised: 01/19/2016 Document  Reviewed: 09/17/2014 Elsevier Interactive Patient Education  2017 [employer].

## 2023-02-15 NOTE — Progress Notes (Signed)
Subjective:   Gabriella Ferguson is a 89 y.o. female who presents for Medicare Annual (Subsequent) preventive examination.  Visit Complete: Virtual  I connected with  Gabriella Ferguson on 02/15/23 by a audio enabled telemedicine application and verified that I am speaking with the correct person using two identifiers.  Patient Location: Home  Provider Location: Home Office  I discussed the limitations of evaluation and management by telemedicine. The patient expressed understanding and agreed to proceed.  Patient Medicare AWV questionnaire was completed by the patient on 02/15/2023; I have confirmed that all information answered by patient is correct and no changes since this date.  Review of Systems    Nutrition Risk Assessment:  Has the patient had any N/V/D within the last 2 months?  No  Does the patient have any non-healing wounds?  No  Has the patient had any unintentional weight loss or weight gain?  No   Diabetes:  Is the patient diabetic?  Yes  If diabetic, was a CBG obtained today?  No  Did the patient bring in their glucometer from home?  No  How often do you monitor your CBG's? Daily .   Financial Strains and Diabetes Management:  Are you having any financial strains with the device, your supplies or your medication? No .  Does the patient want to be seen by Chronic Care Management for management of their diabetes?  No  Would the patient like to be referred to a Nutritionist or for Diabetic Management?  No   Diabetic Exams:  Diabetic Eye Exam: Overdue for diabetic eye exam. Pt has been advised about the importance in completing this exam. Patient advised to call and schedule an eye exam. Diabetic Foot Exam: Overdue, Pt has been advised about the importance in completing this exam. Pt is scheduled for diabetic foot exam on next office visit .  Cardiac Risk Factors include: advanced age (>4men, >69 women);diabetes mellitus;dyslipidemia;hypertension      Objective:    Today's Vitals   02/15/23 1117  Weight: 178 lb (80.7 kg)  Height: 5\' 4"  (1.626 m)   Body mass index is 30.55 kg/m.     02/15/2023   11:20 AM 02/13/2022    2:17 PM 01/24/2021   10:40 AM 01/22/2020    9:28 AM 01/21/2019    9:46 AM  Advanced Directives  Does Patient Have a Medical Advance Directive? Yes Yes No No No  Type of Estate agent of [place];Living will Healthcare Power of [place];Living will     Copy of Healthcare Power of Attorney in Chart? No - copy requested No - copy requested     Would patient like information on creating a medical advance directive?   No - Guardian declined No - Patient declined No - Patient declined    Current Medications (verified) Outpatient Encounter Medications as of 02/15/2023  Medication Sig   alendronate (FOSAMAX) 70 MG tablet TAKE 1 TABLET ONCE A WEEK WITH A FULL GLASS OF WATER ON AN EMPTY STOMACH   aspirin EC 81 MG tablet Take 81 mg by mouth daily.   blood glucose meter kit and supplies Dispense based on patient and insurance preference. Use up to four times daily as directed. E11.9   cephALEXin (KEFLEX) 500 MG capsule Take 1 capsule (500 mg total) by mouth 2 (two) times daily. (Patient not taking: Reported on 02/15/2023)   furosemide (LASIX) 20 MG tablet TAKE ONE TABLET ONCE DAILY   gabapentin (NEURONTIN) 300 MG capsule TAKE ONE CAPSULE TWICE  DAILY   glipiZIDE (GLUCOTROL XL) 2.5 MG 24 hr tablet TAKE (1) TABLET DAILY WITH BREAKFAST.   glucose blood (CONTOUR NEXT TEST) test strip Check BS up to 4 times daily as directed Dx E11.9   Insulin Pen Needle (AUM MINI INSULIN PEN NEEDLE) 32G X 8 MM MISC 1 applicator by Does not apply route daily.   LANTUS SOLOSTAR 100 UNIT/ML Solostar Pen INJECT 18 UNITS AT BEDTIME   metFORMIN (GLUCOPHAGE) 500 MG tablet TAKE (1) TABLET TWICE A DAY WITH MEALS (BREAKFAST AND SUPPER)   Multiple Vitamins-Minerals (CENTRUM ADULTS) TABS Take by mouth.   MYRBETRIQ 50 MG TB24 tablet TAKE ONE  TABLET ONCE DAILY   RYBELSUS 14 MG TABS TAKE ONE TABLET DAILY   simvastatin (ZOCOR) 20 MG tablet TAKE 1 TABLET ONCE DAILY   TRUEplus Lancets 33G MISC Check BS up to 4 times daily as directed Dx E11.9   No facility-administered encounter medications on file as of 02/15/2023.    Allergies (verified) Semaglutide   History: Past Medical History:  Diagnosis Date   Breast cancer (HCC)    Diabetes mellitus without complication (HCC)    Type 2   Hip fracture (HCC)    Meningitis    Past Surgical History:  Procedure Laterality Date   HIP CLOSED REDUCTION     Family History  Problem Relation Age of Onset   Diabetes Daughter    Stroke Mother    Stroke Father    Social History   Socioeconomic History   Marital status: Widowed    Spouse name: Not on file   Number of children: 2   Years of education: Not on file   Highest education level: 10th grade  Occupational History   Occupation: Retired  Tobacco Use   Smoking status: Never   Smokeless tobacco: Never  Vaping Use   Vaping Use: Never used  Substance and Sexual Activity   Alcohol use: No   Drug use: No   Sexual activity: Not Currently  Other Topics Concern   Not on file  Social History Narrative   Lives alone in senior apartments ([place]) on ground level   Daughter visits daily and helps with medication and diabetes.    Family members take her to appts and shopping.   Social Determinants of Health   Financial Resource Strain: Low Risk  (02/15/2023)   Overall Financial Resource Strain (CARDIA)    Difficulty of Paying Living Expenses: Not hard at all  Food Insecurity: No Food Insecurity (02/15/2023)   Hunger Vital Sign    Worried About Running Out of Food in the Last Year: Never true    Ran Out of Food in the Last Year: Never true  Transportation Needs: No Transportation Needs (02/15/2023)   PRAPARE - Administrator, Civil Service (Medical): No    Lack of Transportation (Non-Medical): No  Physical  Activity: Insufficiently Active (02/15/2023)   Exercise Vital Sign    Days of Exercise per Week: 2 days    Minutes of Exercise per Session: 20 min  Stress: No Stress Concern Present (02/15/2023)   [employer] of Occupational Health - Occupational Stress Questionnaire    Feeling of Stress : Not at all  Social Connections: Moderately Isolated (02/15/2023)   Social Connection and Isolation Panel [NHANES]    Frequency of Communication with Friends and Family: More than three times a week    Frequency of Social Gatherings with Friends and Family: More than three times a week    Attends Religious  Services: More than 4 times per year    Active Member of Clubs or Organizations: No    Attends Banker Meetings: Never    Marital Status: Widowed    Tobacco Counseling Counseling given: Not Answered   Clinical Intake:  Pre-visit preparation completed: Yes  Pain : No/denies pain     Nutritional Risks: None Diabetes: Yes CBG done?: No Did pt. bring in CBG monitor from home?: No  How often do you need to have someone help you when you read instructions, pamphlets, or other written materials from your doctor or pharmacy?: 1 - Never  Interpreter Needed?: No  Information entered by :: Renie Ora, LPN   Activities of Daily Living    02/15/2023   11:20 AM  In your present state of health, do you have any difficulty performing the following activities:  Hearing? 0  Vision? 0  Difficulty concentrating or making decisions? 0  Walking or climbing stairs? 0  Dressing or bathing? 0  Doing errands, shopping? 0  Preparing Food and eating ? N  Using the Toilet? N  In the past six months, have you accidently leaked urine? N  Do you have problems with loss of bowel control? N  Managing your Medications? Y  Managing your Finances? Y  Housekeeping or managing your Housekeeping? Y    Patient Care Team: Junie Spencer, FNP as PCP - General (Family Medicine) Danella Maiers, [facility] (Pharmacist) Delora Fuel, OD (Optometry)  Indicate any recent Medical Services you may have received from other than [facility] providers in the past year (date may be approximate).     Assessment:   This is a routine wellness examination for [place].  Hearing/Vision screen Vision Screening - Comments:: Referral 02/15/2023  Dietary issues and exercise activities discussed:     Goals Addressed             This Visit's Progress    DIET - INCREASE WATER INTAKE         Depression Screen    02/15/2023   11:19 AM 07/02/2022    1:37 PM 03/12/2022    2:29 PM 02/13/2022    2:15 PM 01/25/2022    2:32 PM 10/02/2021    2:14 PM 06/29/2021    1:47 PM  PHQ 2/9 Scores  PHQ - 2 Score 0 0 0 0 0 0 0  PHQ- 9 Score  0     0    Fall Risk    02/15/2023   11:18 AM 03/12/2022    2:29 PM 02/13/2022    2:12 PM 01/25/2022    2:43 PM 10/02/2021    2:14 PM  Fall Risk   Falls in the past year? 0 0 1 0 0  Number falls in past yr: 0 0 1    Injury with Fall? 0 0 0    Risk for fall due to : No Fall Risks History of fall(s) History of fall(s);Impaired balance/gait;Orthopedic patient    Follow up Falls prevention discussed Falls evaluation completed Education provided;Falls prevention discussed      MEDICARE RISK AT HOME:  Medicare Risk at Home - 02/15/23 1118     Any stairs in or around the home? No    If so, are there any without handrails? No    Home free of loose throw rugs in walkways, pet beds, electrical cords, etc? Yes    Adequate lighting in your home to reduce risk of falls? Yes    Life alert? No  Use of a cane, walker or w/c? Yes    Grab bars in the bathroom? Yes    Shower chair or bench in shower? Yes    Elevated toilet seat or a handicapped toilet? Yes             TIMED UP AND GO:  Was the test performed?  No    Cognitive Function:        02/15/2023   11:20 AM 02/13/2022    2:18 PM 01/24/2021   10:47 AM 01/22/2020    9:30 AM 01/21/2019    9:58 AM  6CIT Screen  What  Year? 0 points 0 points 0 points 0 points 0 points  What month? 0 points 0 points 0 points 0 points 0 points  What time? 0 points 0 points 0 points 0 points 0 points  Count back from 20 0 points 0 points 0 points 0 points 0 points  Months in reverse 2 points 2 points 0 points 0 points 0 points  Repeat phrase 4 points 8 points 10 points 4 points 0 points  Total Score 6 points 10 points 10 points 4 points 0 points    Immunizations Immunization History  Administered Date(s) Administered   Fluad Quad(high Dose 65+) 06/10/2020, 06/29/2021, 07/02/2022   Moderna Sars-Covid-2 Vaccination 10/26/2019, 11/23/2019, 07/19/2020   Pneumococcal Conjugate-13 12/31/2016   Pneumococcal Polysaccharide-23 04/09/2018   Tdap 12/31/2016   Zoster Recombinat (Shingrix) 02/02/2021, 10/02/2021    TDAP status: Up to date  Flu Vaccine status: Up to date  Pneumococcal vaccine status: Up to date  Covid-19 vaccine status: Completed vaccines  Qualifies for Shingles Vaccine? Yes   Zostavax completed Yes   Shingrix Completed?: Yes  Screening Tests Health Maintenance  Topic Date Due   OPHTHALMOLOGY EXAM  10/08/2019   COVID-19 Vaccine (4 - 2023-24 season) 04/27/2022   FOOT EXAM  01/26/2023   INFLUENZA VACCINE  03/28/2023   HEMOGLOBIN A1C  05/08/2023   Medicare Annual Wellness (AWV)  02/15/2024   DTaP/Tdap/Td (2 - Td or Tdap) 01/01/2027   Pneumonia Vaccine 20+ Years old  Completed   Zoster Vaccines- Shingrix  Completed   HPV VACCINES  Aged Out   DEXA SCAN  Discontinued    Health Maintenance  Health Maintenance Due  Topic Date Due   OPHTHALMOLOGY EXAM  10/08/2019   COVID-19 Vaccine (4 - 2023-24 season) 04/27/2022   FOOT EXAM  01/26/2023    Colorectal cancer screening: No longer required.   Mammogram status: No longer required due to age .  Bone Density status: Ordered declined . Pt provided with contact info and advised to call to schedule appt.  Lung Cancer Screening: (Low Dose CT Chest  recommended if Age 1-80 years, 20 pack-year currently smoking OR have quit w/in 15years.) does not qualify.   Lung Cancer Screening Referral: n/a  Additional Screening:  Hepatitis C Screening: does not qualify;   Vision Screening: Recommended annual ophthalmology exams for early detection of glaucoma and other disorders of the eye. Is the patient up to date with their annual eye exam?  No  Who is the provider or what is the name of the office in which the patient attends annual eye exams? None, referral 02/15/2023 If pt is not established with a provider, would they like to be referred to a provider to establish care? No .   Dental Screening: Recommended annual dental exams for proper oral hygiene  Diabetic Foot Exam: Diabetic Foot Exam: Overdue, Pt has been advised about the importance  in completing this exam. Pt is scheduled for diabetic foot exam on next office visit .  Community Resource Referral / Chronic Care Management: CRR required this visit?  No   CCM required this visit?  No     Plan:     I have personally reviewed and noted the following in the patient's chart:   Medical and social history Use of alcohol, tobacco or illicit drugs  Current medications and supplements including opioid prescriptions. Patient is not currently taking opioid prescriptions. Functional ability and status Nutritional status Physical activity Advanced directives List of other physicians Hospitalizations, surgeries, and ER visits in previous 12 months Vitals Screenings to include cognitive, depression, and falls Referrals and appointments  In addition, I have reviewed and discussed with patient certain preventive protocols, quality metrics, and best practice recommendations. A written personalized care plan for preventive services as well as general preventive health recommendations were provided to patient.     Lorrene Reid, LPN   1/61/0960   After Visit Summary: (MyChart) Due to  this being a telephonic visit, the after visit summary with patients personalized plan was offered to patient via MyChart   Nurse Notes: none

## 2023-02-18 ENCOUNTER — Other Ambulatory Visit: Payer: Self-pay | Admitting: Family

## 2023-02-18 DIAGNOSIS — M810 Age-related osteoporosis without current pathological fracture: Secondary | ICD-10-CM

## 2023-02-18 DIAGNOSIS — R600 Localized edema: Secondary | ICD-10-CM

## 2023-02-18 NOTE — Telephone Encounter (Signed)
APPT MADE 05/09/23

## 2023-02-18 NOTE — Telephone Encounter (Signed)
[facility] NTBS in Sept for 6 mos FU RF sent to pharmacy

## 2023-03-01 ENCOUNTER — Other Ambulatory Visit: Payer: Self-pay | Admitting: Family

## 2023-03-01 DIAGNOSIS — N3281 Overactive bladder: Secondary | ICD-10-CM

## 2023-03-02 ENCOUNTER — Other Ambulatory Visit: Payer: Self-pay | Admitting: Family

## 2023-03-02 DIAGNOSIS — E1169 Type 2 diabetes mellitus with other specified complication: Secondary | ICD-10-CM

## 2023-03-02 DIAGNOSIS — E1165 Type 2 diabetes mellitus with hyperglycemia: Secondary | ICD-10-CM

## 2023-04-19 ENCOUNTER — Other Ambulatory Visit: Payer: Self-pay | Admitting: Family

## 2023-04-19 DIAGNOSIS — E1165 Type 2 diabetes mellitus with hyperglycemia: Secondary | ICD-10-CM

## 2023-04-30 ENCOUNTER — Other Ambulatory Visit: Payer: Self-pay | Admitting: Family

## 2023-04-30 DIAGNOSIS — G629 Polyneuropathy, unspecified: Secondary | ICD-10-CM

## 2023-05-09 ENCOUNTER — Encounter: Payer: Self-pay | Admitting: Family

## 2023-05-09 ENCOUNTER — Ambulatory Visit (INDEPENDENT_AMBULATORY_CARE_PROVIDER_SITE_OTHER): Payer: Medicare Other | Admitting: Family

## 2023-05-09 VITALS — BP 136/60 | HR 82 | Temp 97.7°F | Ht <= 58 in | Wt 178.4 lb

## 2023-05-09 DIAGNOSIS — M8000XD Age-related osteoporosis with current pathological fracture, unspecified site, subsequent encounter for fracture with routine healing: Secondary | ICD-10-CM | POA: Diagnosis not present

## 2023-05-09 DIAGNOSIS — N390 Urinary tract infection, site not specified: Secondary | ICD-10-CM | POA: Diagnosis not present

## 2023-05-09 DIAGNOSIS — E1169 Type 2 diabetes mellitus with other specified complication: Secondary | ICD-10-CM

## 2023-05-09 DIAGNOSIS — Z23 Encounter for immunization: Secondary | ICD-10-CM

## 2023-05-09 DIAGNOSIS — I10 Essential (primary) hypertension: Secondary | ICD-10-CM

## 2023-05-09 DIAGNOSIS — E785 Hyperlipidemia, unspecified: Secondary | ICD-10-CM | POA: Diagnosis not present

## 2023-05-09 DIAGNOSIS — N1831 Chronic kidney disease, stage 3a: Secondary | ICD-10-CM

## 2023-05-09 DIAGNOSIS — K5900 Constipation, unspecified: Secondary | ICD-10-CM

## 2023-05-09 LAB — BAYER DCA HB A1C WAIVED: HB A1C (BAYER DCA - WAIVED): 6.8 % — ABNORMAL HIGH (ref 4.8–5.6)

## 2023-05-09 NOTE — Patient Instructions (Signed)

## 2023-05-09 NOTE — Progress Notes (Signed)
Subjective:    Patient ID: Gabriella Ferguson, female    DOB: November 11, 1928, 89 y.o.   MRN: [id number]  Chief Complaint  Patient presents with   Medical Management of Chronic Issues   Pt presents to the office today for  chronic follow up. She has osteoporosis and taking Fosamax weekly.    She has hx of recurrent UTI and takes Keflex 250 mg daily. However, this is not on her list and she does not know her medications. Her daughter puts her medications in a pill container weekly.   Pt has peripheral edema and takes lasix 20 mg daily.    She is followed by Dermatologists for skin cancer.  Hypertension This is a chronic problem. The current episode started more than 1 year ago. The problem has been waxing and waning since onset. The problem is uncontrolled. Associated symptoms include malaise/fatigue. Pertinent negatives include no blurred vision, peripheral edema or shortness of breath. Risk factors for coronary artery disease include dyslipidemia, diabetes mellitus, obesity and sedentary lifestyle. The current treatment provides moderate improvement.  Hyperlipidemia This is a chronic problem. The current episode started more than 1 year ago. The problem is controlled. Recent lipid tests were reviewed and are normal. Exacerbating diseases include obesity. Pertinent negatives include no shortness of breath. Current antihyperlipidemic treatment includes statins. The current treatment provides moderate improvement of lipids. Risk factors for coronary artery disease include dyslipidemia, diabetes mellitus, hypertension, a sedentary lifestyle and post-menopausal.  Diabetes She presents for her follow-up diabetic visit. She has type 2 diabetes mellitus. Pertinent negatives for diabetes include no blurred vision and no foot paresthesias. Symptoms are stable. Risk factors for coronary artery disease include dyslipidemia, diabetes mellitus, hypertension, sedentary lifestyle and post-menopausal. She is  following a generally unhealthy diet. (Daughter checks glucose nightly, pt states "its good" but does not know what they are.  )  Constipation This is a chronic problem. The current episode started more than 1 year ago. The problem has been waxing and waning since onset. Her stool frequency is 1 time per day. She has tried diet changes for the symptoms. The treatment provided mild relief.      Review of Systems  Constitutional:  Positive for malaise/fatigue.  Eyes:  Negative for blurred vision.  Respiratory:  Negative for shortness of breath.   Gastrointestinal:  Positive for constipation.  All other systems reviewed and are negative.      Objective:   Physical Exam Vitals reviewed.  Constitutional:      General: She is not in acute distress.    Appearance: She is well-developed. She is obese.  HENT:     Head: Normocephalic and atraumatic.     Right Ear: Tympanic membrane normal.     Left Ear: Tympanic membrane normal.  Eyes:     Pupils: Pupils are equal, round, and reactive to light.  Neck:     Thyroid: No thyromegaly.  Cardiovascular:     Rate and Rhythm: Normal rate and regular rhythm.     Heart sounds: Normal heart sounds. No murmur heard. Pulmonary:     Effort: Pulmonary effort is normal. No respiratory distress.     Breath sounds: Normal breath sounds. No wheezing.  Abdominal:     General: Bowel sounds are normal. There is no distension.     Palpations: Abdomen is soft.     Tenderness: There is no abdominal tenderness.  Musculoskeletal:        General: No tenderness. Normal range of motion.  Cervical back: Normal range of motion and neck supple.  Skin:    General: Skin is warm and dry.  Neurological:     Mental Status: She is alert and oriented to person, place, and time.     Cranial Nerves: No cranial nerve deficit.     Deep Tendon Reflexes: Reflexes are normal and symmetric.  Psychiatric:        Behavior: Behavior normal.        Thought Content: Thought  content normal.        Judgment: Judgment normal.    Diabetic Foot Exam - Simple   Simple Foot Form Diabetic Foot exam was performed with the following findings: Yes 05/09/2023 10:30 AM  Visual Inspection No deformities, no ulcerations, no other skin breakdown bilaterally: Yes Sensation Testing Intact to touch and monofilament testing bilaterally: Yes Pulse Check Posterior Tibialis and Dorsalis pulse intact bilaterally: Yes Comments Toenail thick                                                                               BP 136/60   Pulse 82   Temp 97.7 F (36.5 C) (Temporal)   Ht 3' (0.914 m)   Wt 178 lb 6.4 oz (80.9 kg)   SpO2 95%   BMI 96.78 kg/m   Assessment & Plan:  Gabriella Ferguson comes in today with chief complaint of Medical Management of Chronic Issues   Diagnosis and orders addressed:  1. Type 2 diabetes mellitus with other specified complication, without long-term current use of insulin (HCC) - Bayer DCA Hb A1c Waived - CMP14+EGFR - CBC with Differential/Platelet  2. Essential hypertension - CMP14+EGFR - CBC with Differential/Platelet  3. Hyperlipidemia associated with type 2 diabetes mellitus (HCC) - CMP14+EGFR - CBC with Differential/Platelet  4. Osteoporosis with current pathological fracture with routine healing, unspecified osteoporosis type, subsequent encounter - CMP14+EGFR - CBC with Differential/Platelet  5. Recurrent UTI - CMP14+EGFR - CBC with Differential/Platelet  6. Stage 3a chronic kidney disease (HCC) - CMP14+EGFR - CBC with Differential/Platelet  7. Constipation, unspecified constipation type - CMP14+EGFR - CBC with Differential/Platelet   Labs pending Continue medications, print off of current medications given. Daughter will review and call us back if correct.  Health Maintenance reviewed Diet and exercise encouraged  Follow up plan: 3 months    Jannifer Rodney, FNP

## 2023-05-10 ENCOUNTER — Other Ambulatory Visit: Payer: Self-pay | Admitting: Family

## 2023-05-10 LAB — CBC WITH DIFFERENTIAL/PLATELET
Basophils Absolute: 0 10*3/uL (ref 0.0–0.2)
Basos: 0 %
EOS (ABSOLUTE): 0.1 10*3/uL (ref 0.0–0.4)
Eos: 1 %
Hematocrit: 34.3 % (ref 34.0–46.6)
Hemoglobin: 11.2 g/dL (ref 11.1–15.9)
Immature Grans (Abs): 0 10*3/uL (ref 0.0–0.1)
Immature Granulocytes: 0 %
Lymphocytes Absolute: 2.3 10*3/uL (ref 0.7–3.1)
Lymphs: 29 %
MCH: 29.3 pg (ref 26.6–33.0)
MCHC: 32.7 g/dL (ref 31.5–35.7)
MCV: 90 fL (ref 79–97)
Monocytes Absolute: 0.9 10*3/uL (ref 0.1–0.9)
Monocytes: 11 %
Neutrophils Absolute: 4.6 10*3/uL (ref 1.4–7.0)
Neutrophils: 59 %
Platelets: 262 10*3/uL (ref 150–450)
RBC: 3.82 x10E6/uL (ref 3.77–5.28)
RDW: 13.3 % (ref 11.7–15.4)
WBC: 8 10*3/uL (ref 3.4–10.8)

## 2023-05-10 LAB — CMP14+EGFR
ALT: 7 IU/L (ref 0–32)
AST: 16 IU/L (ref 0–40)
Albumin: 3.7 g/dL (ref 3.6–4.6)
Alkaline Phosphatase: 52 IU/L (ref 44–121)
BUN/Creatinine Ratio: 14 (ref 12–28)
BUN: 19 mg/dL (ref 10–36)
Bilirubin Total: 0.2 mg/dL (ref 0.0–1.2)
CO2: 24 mmol/L (ref 20–29)
Calcium: 9 mg/dL (ref 8.7–10.3)
Chloride: 101 mmol/L (ref 96–106)
Creatinine, Ser: 1.33 mg/dL — ABNORMAL HIGH (ref 0.57–1.00)
Globulin, Total: 2.7 g/dL (ref 1.5–4.5)
Glucose: 147 mg/dL — ABNORMAL HIGH (ref 70–99)
Potassium: 4.2 mmol/L (ref 3.5–5.2)
Sodium: 140 mmol/L (ref 134–144)
Total Protein: 6.4 g/dL (ref 6.0–8.5)
eGFR: 38 mL/min/{1.73_m2} — ABNORMAL LOW (ref >59)

## 2023-05-13 ENCOUNTER — Other Ambulatory Visit: Payer: Self-pay | Admitting: Family

## 2023-05-13 DIAGNOSIS — M810 Age-related osteoporosis without current pathological fracture: Secondary | ICD-10-CM

## 2023-05-16 ENCOUNTER — Other Ambulatory Visit: Payer: Self-pay | Admitting: Family

## 2023-05-16 DIAGNOSIS — R600 Localized edema: Secondary | ICD-10-CM

## 2023-05-31 ENCOUNTER — Other Ambulatory Visit: Payer: Self-pay | Admitting: Family

## 2023-05-31 DIAGNOSIS — N3281 Overactive bladder: Secondary | ICD-10-CM

## 2023-06-03 ENCOUNTER — Other Ambulatory Visit: Payer: Self-pay | Admitting: Family

## 2023-06-03 DIAGNOSIS — E1165 Type 2 diabetes mellitus with hyperglycemia: Secondary | ICD-10-CM

## 2023-06-03 DIAGNOSIS — E785 Hyperlipidemia, unspecified: Secondary | ICD-10-CM

## 2023-07-15 ENCOUNTER — Other Ambulatory Visit: Payer: Self-pay | Admitting: Family

## 2023-07-15 DIAGNOSIS — E1165 Type 2 diabetes mellitus with hyperglycemia: Secondary | ICD-10-CM

## 2023-07-30 ENCOUNTER — Other Ambulatory Visit: Payer: Self-pay | Admitting: Family

## 2023-07-30 DIAGNOSIS — G629 Polyneuropathy, unspecified: Secondary | ICD-10-CM

## 2023-08-05 ENCOUNTER — Other Ambulatory Visit: Payer: Self-pay | Admitting: Family

## 2023-08-05 DIAGNOSIS — M810 Age-related osteoporosis without current pathological fracture: Secondary | ICD-10-CM

## 2023-08-08 ENCOUNTER — Ambulatory Visit: Payer: Medicare Other | Admitting: Family

## 2023-08-12 ENCOUNTER — Other Ambulatory Visit: Payer: Self-pay | Admitting: Family

## 2023-08-12 DIAGNOSIS — R600 Localized edema: Secondary | ICD-10-CM

## 2023-08-16 ENCOUNTER — Ambulatory Visit: Payer: Medicare Other

## 2023-08-27 ENCOUNTER — Other Ambulatory Visit: Payer: Self-pay | Admitting: Family

## 2023-08-27 DIAGNOSIS — N3281 Overactive bladder: Secondary | ICD-10-CM

## 2023-09-02 ENCOUNTER — Other Ambulatory Visit: Payer: Self-pay | Admitting: Family

## 2023-09-02 DIAGNOSIS — E1165 Type 2 diabetes mellitus with hyperglycemia: Secondary | ICD-10-CM

## 2023-09-02 DIAGNOSIS — E1169 Type 2 diabetes mellitus with other specified complication: Secondary | ICD-10-CM

## 2023-09-03 ENCOUNTER — Ambulatory Visit: Payer: Medicare Other | Admitting: Family

## 2023-09-10 ENCOUNTER — Other Ambulatory Visit: Payer: Self-pay | Admitting: Family

## 2023-09-10 DIAGNOSIS — R600 Localized edema: Secondary | ICD-10-CM

## 2023-09-20 ENCOUNTER — Ambulatory Visit (INDEPENDENT_AMBULATORY_CARE_PROVIDER_SITE_OTHER): Payer: Medicare Other | Admitting: Family

## 2023-09-20 ENCOUNTER — Other Ambulatory Visit: Payer: Self-pay | Admitting: Family

## 2023-09-20 ENCOUNTER — Encounter: Payer: Self-pay | Admitting: Family

## 2023-09-20 VITALS — BP 179/74 | HR 83 | Temp 97.4°F | Ht 62.0 in | Wt 178.0 lb

## 2023-09-20 DIAGNOSIS — N1831 Chronic kidney disease, stage 3a: Secondary | ICD-10-CM

## 2023-09-20 DIAGNOSIS — G629 Polyneuropathy, unspecified: Secondary | ICD-10-CM | POA: Diagnosis not present

## 2023-09-20 DIAGNOSIS — M150 Primary generalized (osteo)arthritis: Secondary | ICD-10-CM

## 2023-09-20 DIAGNOSIS — K219 Gastro-esophageal reflux disease without esophagitis: Secondary | ICD-10-CM

## 2023-09-20 DIAGNOSIS — E1165 Type 2 diabetes mellitus with hyperglycemia: Secondary | ICD-10-CM

## 2023-09-20 DIAGNOSIS — I10 Essential (primary) hypertension: Secondary | ICD-10-CM

## 2023-09-20 DIAGNOSIS — R600 Localized edema: Secondary | ICD-10-CM | POA: Diagnosis not present

## 2023-09-20 DIAGNOSIS — N390 Urinary tract infection, site not specified: Secondary | ICD-10-CM | POA: Diagnosis not present

## 2023-09-20 DIAGNOSIS — E1169 Type 2 diabetes mellitus with other specified complication: Secondary | ICD-10-CM

## 2023-09-20 DIAGNOSIS — N3281 Overactive bladder: Secondary | ICD-10-CM

## 2023-09-20 DIAGNOSIS — M810 Age-related osteoporosis without current pathological fracture: Secondary | ICD-10-CM

## 2023-09-20 DIAGNOSIS — E785 Hyperlipidemia, unspecified: Secondary | ICD-10-CM

## 2023-09-20 DIAGNOSIS — K5900 Constipation, unspecified: Secondary | ICD-10-CM

## 2023-09-20 MED ORDER — CEPHALEXIN 500 MG PO CAPS: 500.0000 mg | ORAL_CAPSULE | Freq: Two times a day (BID) | ORAL | 0 refills | Status: DC

## 2023-09-20 MED ORDER — ACETAMINOPHEN 500 MG PO TABS: 500.0000 mg | ORAL_TABLET | Freq: Four times a day (QID) | ORAL | 2 refills | Status: DC | PRN

## 2023-09-20 MED ORDER — FUROSEMIDE 20 MG PO TABS: 20.0000 mg | ORAL_TABLET | Freq: Every day | ORAL | 0 refills | Status: DC

## 2023-09-20 MED ORDER — OMEPRAZOLE 20 MG PO CPDR: 20.0000 mg | DELAYED_RELEASE_CAPSULE | Freq: Every day | ORAL | 3 refills | Status: DC

## 2023-09-20 MED ORDER — ALENDRONATE SODIUM 70 MG PO TABS: ORAL_TABLET | ORAL | 0 refills | Status: DC

## 2023-09-20 MED ORDER — RYBELSUS 14 MG PO TABS: 1.0000 | ORAL_TABLET | Freq: Every day | ORAL | 0 refills | Status: DC

## 2023-09-20 MED ORDER — LANTUS SOLOSTAR 100 UNIT/ML ~~LOC~~ SOPN: PEN_INJECTOR | SUBCUTANEOUS | 0 refills | Status: DC

## 2023-09-20 MED ORDER — GABAPENTIN 300 MG PO CAPS: ORAL_CAPSULE | ORAL | 0 refills | Status: DC

## 2023-09-20 MED ORDER — MIRABEGRON ER 50 MG PO TB24: 50.0000 mg | ORAL_TABLET | Freq: Every day | ORAL | 0 refills | Status: DC

## 2023-09-20 NOTE — Progress Notes (Signed)
Subjective:    Patient ID: Gabriella Ferguson, female    DOB: 1928-10-25, 89 y.o.   MRN: [id number]  Chief Complaint  Patient presents with   Medical Management of Chronic Issues   Knee Pain   Urinary Tract Infection   Hip Pain   Abdominal Pain   Pt presents to the office today for  chronic follow up. She has osteoporosis and taking Fosamax weekly.    She has hx of recurrent UTI and use to takes Keflex 250 mg daily, but has not had been taking it.    Pt has peripheral edema and takes lasix 20 mg daily.    She is followed by Dermatologists for skin cancer.  Hypertension This is a chronic problem. The current episode started more than 1 year ago. The problem has been waxing and waning since onset. The problem is uncontrolled. Associated symptoms include malaise/fatigue and peripheral edema. Pertinent negatives include no blurred vision or shortness of breath. Risk factors for coronary artery disease include dyslipidemia, diabetes mellitus, obesity and sedentary lifestyle. The current treatment provides moderate improvement.  Hyperlipidemia This is a chronic problem. The current episode started more than 1 year ago. The problem is controlled. Recent lipid tests were reviewed and are normal. Exacerbating diseases include obesity. Pertinent negatives include no shortness of breath. Current antihyperlipidemic treatment includes statins. The current treatment provides moderate improvement of lipids. Risk factors for coronary artery disease include dyslipidemia, diabetes mellitus, hypertension, a sedentary lifestyle and post-menopausal.  Diabetes She presents for her follow-up diabetic visit. She has type 2 diabetes mellitus. Associated symptoms include foot paresthesias. Pertinent negatives for diabetes include no blurred vision. Symptoms are stable. Diabetic complications include peripheral neuropathy. Risk factors for coronary artery disease include dyslipidemia, diabetes mellitus, hypertension,  sedentary lifestyle and post-menopausal. She is following a generally unhealthy diet. Her overall blood glucose range is 130-140 mg/dl. ( )  Constipation This is a chronic problem. The current episode started more than 1 year ago. The problem has been waxing and waning since onset. Her stool frequency is 1 time per day. Associated symptoms include abdominal pain. Pertinent negatives include no nausea or vomiting. She has tried diet changes for the symptoms. The treatment provided mild relief.  Knee Pain  The incident occurred more than 1 week ago. There was no injury mechanism. The pain is present in the left knee and right knee. The quality of the pain is described as aching. The pain is at a severity of 6/10. The pain is moderate. She reports no foreign bodies present. The symptoms are aggravated by weight bearing. She has tried non-weight bearing for the symptoms. The treatment provided mild relief.  Hip Pain   Abdominal Pain Associated symptoms include belching, constipation and frequency. Pertinent negatives include no hematuria, nausea or vomiting. Her past medical history is significant for GERD.  Urinary Frequency  This is a recurrent problem. The current episode started 1 to 4 weeks ago. The problem occurs intermittently. Associated symptoms include frequency and urgency. Pertinent negatives include no hematuria, hesitancy, nausea or vomiting. She has tried increased fluids for the symptoms.  Gastroesophageal Reflux She complains of abdominal pain, belching, heartburn and a hoarse voice. She reports no nausea. This is a new problem. The current episode started more than 1 month ago. The problem occurs occasionally. Risk factors include obesity. She has tried a diet change for the symptoms. The treatment provided mild relief.      Review of Systems  Constitutional:  Positive for malaise/fatigue.  HENT:  Positive for hoarse voice.   Eyes:  Negative for blurred vision.  Respiratory:   Negative for shortness of breath.   Gastrointestinal:  Positive for abdominal pain, constipation and heartburn. Negative for nausea and vomiting.  Genitourinary:  Positive for frequency and urgency. Negative for hematuria and hesitancy.  All other systems reviewed and are negative.      Objective:   Physical Exam Vitals reviewed.  Constitutional:      General: She is not in acute distress.    Appearance: She is well-developed. She is obese.  HENT:     Head: Normocephalic and atraumatic.     Right Ear: Tympanic membrane normal.     Left Ear: Tympanic membrane normal.  Eyes:     Pupils: Pupils are equal, round, and reactive to light.  Neck:     Thyroid: No thyromegaly.  Cardiovascular:     Rate and Rhythm: Normal rate and regular rhythm.     Heart sounds: Normal heart sounds. No murmur heard. Pulmonary:     Effort: Pulmonary effort is normal. No respiratory distress.     Breath sounds: Normal breath sounds. No wheezing.  Abdominal:     General: Bowel sounds are normal. There is no distension.     Palpations: Abdomen is soft.     Tenderness: There is no abdominal tenderness.  Musculoskeletal:        General: No tenderness. Normal range of motion.     Cervical back: Normal range of motion and neck supple.     Right lower leg: Edema (2+) present.     Left lower leg: Edema (2+) present.  Skin:    General: Skin is warm and dry.  Neurological:     Mental Status: She is alert and oriented to person, place, and time.     Cranial Nerves: No cranial nerve deficit.     Motor: Weakness (using cane to walk) present.     Gait: Gait abnormal.     Deep Tendon Reflexes: Reflexes are normal and symmetric.  Psychiatric:        Behavior: Behavior normal.        Thought Content: Thought content normal.        Judgment: Judgment normal.                                                                              BP (!) 179/74   Pulse 83   Temp (!) 97.4 F (36.3 C) (Temporal)   Ht 5'  2" (1.575 m)   Wt 178 lb (80.7 kg)   SpO2 98%   BMI 32.56 kg/m   Assessment & Plan:  Gabriella Ferguson comes in today with chief complaint of Medical Management of Chronic Issues, Knee Pain, Urinary Tract Infection, Hip Pain, and Abdominal Pain   Diagnosis and orders addressed:  1. Osteoporosis, unspecified osteoporosis type, unspecified pathological fracture presence - alendronate (FOSAMAX) 70 MG tablet; TAKE 1 TABLET ONCE A WEEK WITH A FULL GLASS OF WATER ON AN EMPTY STOMACH  Dispense: 12 tablet; Refill: 0 - CMP14+EGFR  2. Peripheral edema - furosemide (LASIX) 20 MG tablet; Take 1 tablet (20 mg total) by mouth daily.  Dispense: 30 tablet;  Refill: 0 - CMP14+EGFR  3. Neuropathy - gabapentin (NEURONTIN) 300 MG capsule; TAKE ONE CAPSULE TWICE DAILY  Dispense: 180 capsule; Refill: 0 - CMP14+EGFR  4. Type 2 diabetes mellitus with hyperglycemia, without long-term current use of insulin (HCC) (Primary) - LANTUS SOLOSTAR 100 UNIT/ML Solostar Pen; INJECT 18 UNITS AT BEDTIME  Dispense: 15 mL; Refill: 0 - Semaglutide (RYBELSUS) 14 MG TABS; Take 1 tablet (14 mg total) by mouth daily.  Dispense: 90 tablet; Refill: 0 - CMP14+EGFR - Bayer DCA Hb A1c Waived - Ambulatory referral to Podiatry  5. OAB (overactive bladder) - mirabegron ER (MYRBETRIQ) 50 MG TB24 tablet; Take 1 tablet (50 mg total) by mouth daily.  Dispense: 90 tablet; Refill: 0 - CMP14+EGFR  6. Constipation, unspecified constipation type - CMP14+EGFR  7. Essential hypertension - CMP14+EGFR  8. Hyperlipidemia associated with type 2 diabetes mellitus (HCC) - CMP14+EGFR  9. Stage 3a chronic kidney disease (HCC) - CMP14+EGFR  10. Recurrent UTI Will send in keflex BID for 7 days - Urinalysis, Complete - Urine Culture - CMP14+EGFR  11. Gastroesophageal reflux disease without esophagitis Start prilosec 20 mg  -Diet discussed- Avoid fried, spicy, citrus foods, caffeine and alcohol -Do not eat 2-3 hours before  bedtime -Encouraged small frequent meals -Avoid NSAID's - omeprazole (PRILOSEC) 20 MG capsule; Take 1 capsule (20 mg total) by mouth daily.  Dispense: 30 capsule; Refill: 3 - CMP14+EGFR  12. Primary osteoarthritis involving multiple joints Start tylenol  - acetaminophen (TYLENOL) 500 MG tablet; Take 1 tablet (500 mg total) by mouth every 6 (six) hours as needed.  Dispense: 90 tablet; Refill: 2  Labs pending Continue current medications  Health Maintenance reviewed Diet and exercise encouraged  Follow up plan: 3 months    Jannifer Rodney, FNP

## 2023-09-20 NOTE — Patient Instructions (Signed)

## 2023-09-23 LAB — URINALYSIS, COMPLETE
Bilirubin, UA: NEGATIVE
Glucose, UA: NEGATIVE
Ketones, UA: NEGATIVE
Nitrite, UA: NEGATIVE
Specific Gravity, UA: 1.02 (ref 1.005–1.030)
Urobilinogen, Ur: 0.2 mg/dL (ref 0.2–1.0)
pH, UA: 5 (ref 5.0–7.5)

## 2023-09-25 LAB — URINE CULTURE

## 2023-10-24 ENCOUNTER — Other Ambulatory Visit: Payer: Self-pay | Admitting: Family

## 2023-10-24 DIAGNOSIS — E1165 Type 2 diabetes mellitus with hyperglycemia: Secondary | ICD-10-CM

## 2023-11-30 ENCOUNTER — Other Ambulatory Visit: Payer: Self-pay | Admitting: Family

## 2023-11-30 DIAGNOSIS — E785 Hyperlipidemia, unspecified: Secondary | ICD-10-CM

## 2023-11-30 DIAGNOSIS — E1165 Type 2 diabetes mellitus with hyperglycemia: Secondary | ICD-10-CM

## 2023-12-10 ENCOUNTER — Telehealth: Payer: Self-pay | Admitting: *Deleted

## 2023-12-10 MED ORDER — TOLTERODINE TARTRATE ER 4 MG PO CP24: 4.0000 mg | ORAL_CAPSULE | Freq: Every day | ORAL | 1 refills | Status: DC

## 2023-12-10 NOTE — Addendum Note (Signed)
 Addended by: Tommas Fragmin A on: 12/10/2023 02:53 PM   Modules accepted: Orders

## 2023-12-10 NOTE — Telephone Encounter (Signed)
 Please advise   Copied from CRM [id number]. Topic: Clinical - Prescription Issue >> Dec 10, 2023  1:14 PM Elle L wrote: Reason for CRM: The patient's daughter, Talitha Faden, states that the patient's insurance would not cover the name brand or the generic version of the mirabegron ER (MYRBETRIQ) 50 MG TB24 tablet. She is requesting to see if a replacement medication can be called in. She will run out of the medication on Thursday. She is requesting it to be sent to the [facility] at [facility], [place] [address].

## 2023-12-10 NOTE — Telephone Encounter (Signed)
 Myrbetriq changed to Detrol 4 mg. Let me know if you have any other problems getting medication.

## 2023-12-11 MED ORDER — TOLTERODINE TARTRATE ER 4 MG PO CP24: 4.0000 mg | ORAL_CAPSULE | Freq: Every day | ORAL | 1 refills | Status: DC

## 2023-12-11 NOTE — Addendum Note (Signed)
 Addended by: Ilyana Manuele D on: 12/11/2023 10:51 AM   Modules accepted: Orders

## 2023-12-11 NOTE — Telephone Encounter (Signed)
 Medication was sent to [facility] yesterday, sent to [facility] pharmacy for pt today

## 2023-12-21 ENCOUNTER — Other Ambulatory Visit: Payer: Self-pay | Admitting: Family

## 2023-12-21 DIAGNOSIS — E1165 Type 2 diabetes mellitus with hyperglycemia: Secondary | ICD-10-CM

## 2024-01-24 ENCOUNTER — Ambulatory Visit: Payer: Medicare Other | Admitting: Family

## 2024-01-24 ENCOUNTER — Encounter: Payer: Self-pay | Admitting: Family

## 2024-01-24 VITALS — BP 125/64 | HR 82 | Temp 97.3°F | Ht 62.0 in | Wt 173.0 lb

## 2024-01-24 DIAGNOSIS — K5900 Constipation, unspecified: Secondary | ICD-10-CM | POA: Diagnosis not present

## 2024-01-24 DIAGNOSIS — N1831 Chronic kidney disease, stage 3a: Secondary | ICD-10-CM

## 2024-01-24 DIAGNOSIS — M150 Primary generalized (osteo)arthritis: Secondary | ICD-10-CM

## 2024-01-24 DIAGNOSIS — I10 Essential (primary) hypertension: Secondary | ICD-10-CM

## 2024-01-24 DIAGNOSIS — Z0000 Encounter for general adult medical examination without abnormal findings: Secondary | ICD-10-CM

## 2024-01-24 DIAGNOSIS — Z0001 Encounter for general adult medical examination with abnormal findings: Secondary | ICD-10-CM

## 2024-01-24 DIAGNOSIS — E1169 Type 2 diabetes mellitus with other specified complication: Secondary | ICD-10-CM

## 2024-01-24 DIAGNOSIS — E785 Hyperlipidemia, unspecified: Secondary | ICD-10-CM

## 2024-01-24 DIAGNOSIS — M8000XD Age-related osteoporosis with current pathological fracture, unspecified site, subsequent encounter for fracture with routine healing: Secondary | ICD-10-CM

## 2024-01-24 DIAGNOSIS — N390 Urinary tract infection, site not specified: Secondary | ICD-10-CM

## 2024-01-24 LAB — BAYER DCA HB A1C WAIVED: HB A1C (BAYER DCA - WAIVED): 7.2 % — ABNORMAL HIGH (ref 4.8–5.6)

## 2024-01-24 LAB — LIPID PANEL

## 2024-01-24 MED ORDER — CEPHALEXIN 250 MG PO CAPS: 250.0000 mg | ORAL_CAPSULE | Freq: Every day | ORAL | 3 refills | Status: DC

## 2024-01-24 NOTE — Progress Notes (Signed)
 Subjective:    Patient ID: Gabriella Ferguson, female    DOB: 31-Jan-1929, 89 y.o.   MRN: [id number]  Chief Complaint  Patient presents with   Medical Management of Chronic Issues    Tylenol  1 in the morning and 1 at night for knee pain not helping    Pt presents to the office today for  CPE and  chronic follow up. She has osteoporosis and taking Fosamax  weekly. Last Dexa scan was 10/22/19.    She has hx of recurrent UTI and use to takes Keflex  250 mg daily, but has not had been taking it.    Pt has peripheral edema and takes lasix  20 mg daily.    She is followed by Dermatologists for skin cancer.  Hypertension This is a chronic problem. The current episode started more than 1 year ago. The problem has been waxing and waning since onset. The problem is uncontrolled. Associated symptoms include malaise/fatigue and peripheral edema (some times). Pertinent negatives include no blurred vision or shortness of breath. Risk factors for coronary artery disease include dyslipidemia, diabetes mellitus, obesity and sedentary lifestyle. The current treatment provides moderate improvement.  Hyperlipidemia This is a chronic problem. The current episode started more than 1 year ago. The problem is controlled. Recent lipid tests were reviewed and are normal. Exacerbating diseases include obesity. Pertinent negatives include no shortness of breath. Current antihyperlipidemic treatment includes statins. The current treatment provides moderate improvement of lipids. Risk factors for coronary artery disease include dyslipidemia, diabetes mellitus, hypertension, a sedentary lifestyle and post-menopausal.  Diabetes She presents for her follow-up diabetic visit. She has type 2 diabetes mellitus. Associated symptoms include foot paresthesias. Pertinent negatives for diabetes include no blurred vision. Symptoms are stable. Pertinent negatives for diabetic complications include no peripheral neuropathy. Risk factors for  coronary artery disease include dyslipidemia, diabetes mellitus, hypertension, sedentary lifestyle and post-menopausal. She is following a generally healthy diet. Her overall blood glucose range is 110-130 mg/dl. ( )  Constipation This is a chronic problem. The current episode started more than 1 year ago. The problem has been waxing and waning since onset. Her stool frequency is 1 time per day. Associated symptoms include abdominal pain. Pertinent negatives include no nausea or vomiting. She has tried diet changes for the symptoms. The treatment provided mild relief.  Hip Pain   Abdominal Pain Associated symptoms include belching, constipation and frequency. Pertinent negatives include no hematuria, nausea or vomiting. Her past medical history is significant for GERD.  Urinary Frequency  This is a recurrent problem. The current episode started 1 to 4 weeks ago. The problem occurs intermittently. Associated symptoms include frequency and urgency. Pertinent negatives include no hematuria, hesitancy, nausea or vomiting. She has tried increased fluids for the symptoms.  Gastroesophageal Reflux She complains of abdominal pain, belching, heartburn and a hoarse voice. She reports no nausea. This is a new problem. The current episode started more than 1 month ago. The problem occurs occasionally. Risk factors include obesity. She has tried a diet change for the symptoms. The treatment provided mild relief.  Arthritis Presents for follow-up visit. She complains of pain and stiffness. The symptoms have been stable. Affected locations include the left knee and right knee. Her pain is at a severity of 6/10.      Review of Systems  Constitutional:  Positive for malaise/fatigue.  HENT:  Positive for hoarse voice.   Eyes:  Negative for blurred vision.  Respiratory:  Negative for shortness of breath.   Gastrointestinal:  Positive for abdominal pain, constipation and heartburn. Negative for nausea and  vomiting.  Genitourinary:  Positive for frequency and urgency. Negative for hematuria and hesitancy.  Musculoskeletal:  Positive for arthritis and stiffness.  All other systems reviewed and are negative.  Family History  Problem Relation Age of Onset   Diabetes Daughter    Stroke Mother    Stroke Father    Social History   Socioeconomic History   Marital status: Widowed    Spouse name: Not on file   Number of children: 2   Years of education: Not on file   Highest education level: 10th grade  Occupational History   Occupation: Retired  Tobacco Use   Smoking status: Never   Smokeless tobacco: Never  Vaping Use   Vaping status: Never Used  Substance and Sexual Activity   Alcohol use: No   Drug use: No   Sexual activity: Not Currently  Other Topics Concern   Not on file  Social History Narrative   Lives alone in senior apartments ([place]) on ground level   Daughter visits daily and helps with medication and diabetes.    Family members take her to appts and shopping.   Social Drivers of Corporate investment banker Strain: Low Risk  (02/15/2023)   Overall Financial Resource Strain (CARDIA)    Difficulty of Paying Living Expenses: Not hard at all  Food Insecurity: No Food Insecurity (02/15/2023)   Hunger Vital Sign    Worried About Running Out of Food in the Last Year: Never true    Ran Out of Food in the Last Year: Never true  Transportation Needs: No Transportation Needs (02/15/2023)   PRAPARE - Administrator, Civil Service (Medical): No    Lack of Transportation (Non-Medical): No  Physical Activity: Insufficiently Active (02/15/2023)   Exercise Vital Sign    Days of Exercise per Week: 2 days    Minutes of Exercise per Session: 20 min  Stress: No Stress Concern Present (02/15/2023)   [employer] of Occupational Health - Occupational Stress Questionnaire    Feeling of Stress : Not at all  Social Connections: Moderately Isolated (02/15/2023)   Social  Connection and Isolation Panel [NHANES]    Frequency of Communication with Friends and Family: More than three times a week    Frequency of Social Gatherings with Friends and Family: More than three times a week    Attends Religious Services: More than 4 times per year    Active Member of [employer] or Organizations: No    Attends Banker Meetings: Never    Marital Status: Widowed       Objective:   Physical Exam Vitals reviewed.  Constitutional:      General: She is not in acute distress.    Appearance: She is well-developed. She is obese.  HENT:     Head: Normocephalic and atraumatic.     Right Ear: Tympanic membrane normal.     Left Ear: Tympanic membrane normal.  Eyes:     Pupils: Pupils are equal, round, and reactive to light.  Neck:     Thyroid : No thyromegaly.  Cardiovascular:     Rate and Rhythm: Normal rate and regular rhythm.     Heart sounds: Normal heart sounds. No murmur heard. Pulmonary:     Effort: Pulmonary effort is normal. No respiratory distress.     Breath sounds: Normal breath sounds. No wheezing.  Abdominal:     General: Bowel sounds are normal.  There is no distension.     Palpations: Abdomen is soft.     Tenderness: There is no abdominal tenderness.  Musculoskeletal:        General: No tenderness. Normal range of motion.     Cervical back: Normal range of motion and neck supple.     Right lower leg: Edema (trace) present.     Left lower leg: Edema (trace) present.  Skin:    General: Skin is warm and dry.  Neurological:     Mental Status: She is alert and oriented to person, place, and time.     Cranial Nerves: No cranial nerve deficit.     Motor: Weakness (using cane to walk) present.     Gait: Gait abnormal.     Deep Tendon Reflexes: Reflexes are normal and symmetric.  Psychiatric:        Behavior: Behavior normal.        Thought Content: Thought content normal.        Judgment: Judgment normal.                                                                               BP (!) 147/70   Pulse 82   Temp (!) 97.3 F (36.3 C) (Temporal)   Ht 5\' 2"  (1.575 m)   Wt 173 lb (78.5 kg)   SpO2 95%   BMI 31.64 kg/m   Assessment & Plan:  Yolanda Hence Rivero comes in today with chief complaint of Medical Management of Chronic Issues (Tylenol  1 in the morning and 1 at night for knee pain not helping )   Diagnosis and orders addressed:  1. Essential hypertension - CMP14+EGFR - CBC with Differential/Platelet  2. Type 2 diabetes mellitus with other specified complication, without long-term current use of insulin  [facility]) Referral to Podiarty - Ambulatory referral to Podiatry - CMP14+EGFR - CBC with Differential/Platelet - Bayer DCA Hb A1c Waived  3. Constipation, unspecified constipation type - CMP14+EGFR - CBC with Differential/Platelet  4. Osteoporosis with current pathological fracture with routine healing, unspecified osteoporosis type, subsequent encounter - CMP14+EGFR - CBC with Differential/Platelet  5. Hyperlipidemia associated with type 2 diabetes mellitus (HCC) - CMP14+EGFR - CBC with Differential/Platelet - Lipid panel  6. Recurrent UTI Start Keflex  250 mg daily Force fluids - cephALEXin  (KEFLEX ) 250 MG capsule; Take 1 capsule (250 mg total) by mouth at bedtime.  Dispense: 90 capsule; Refill: 3 - CMP14+EGFR - CBC with Differential/Platelet  7. Stage 3a chronic kidney disease (HCC) - CMP14+EGFR - CBC with Differential/Platelet  8. Annual physical exam (Primary) - cephALEXin  (KEFLEX ) 250 MG capsule; Take 1 capsule (250 mg total) by mouth at bedtime.  Dispense: 90 capsule; Refill: 3 - CMP14+EGFR - CBC with Differential/Platelet - Lipid panel  9. Primary osteoarthritis involving multiple joints   Labs pending Referral to Podiatry pending  Start Keflex  250 mg daily force fluids.  Can increase tylenol  to [phone] mg TID prn Continue current medications  Health Maintenance reviewed Diet and  exercise encouraged  Follow up plan: 3 months    Tommas Fragmin, FNP

## 2024-01-24 NOTE — Patient Instructions (Signed)

## 2024-01-25 LAB — CMP14+EGFR
ALT: 6 IU/L (ref 0–32)
AST: 15 IU/L (ref 0–40)
Albumin: 4 g/dL (ref 3.6–4.6)
Alkaline Phosphatase: 52 IU/L (ref 44–121)
BUN/Creatinine Ratio: 14 (ref 12–28)
BUN: 19 mg/dL (ref 10–36)
Bilirubin Total: 0.2 mg/dL (ref 0.0–1.2)
CO2: 22 mmol/L (ref 20–29)
Calcium: 9 mg/dL (ref 8.7–10.3)
Chloride: 103 mmol/L (ref 96–106)
Creatinine, Ser: 1.39 mg/dL — ABNORMAL HIGH (ref 0.57–1.00)
Globulin, Total: 2.5 g/dL (ref 1.5–4.5)
Glucose: 118 mg/dL — ABNORMAL HIGH (ref 70–99)
Potassium: 4.4 mmol/L (ref 3.5–5.2)
Sodium: 141 mmol/L (ref 134–144)
Total Protein: 6.5 g/dL (ref 6.0–8.5)
eGFR: 36 mL/min/{1.73_m2} — ABNORMAL LOW (ref >59)

## 2024-01-25 LAB — CBC WITH DIFFERENTIAL/PLATELET
Basophils Absolute: 0 10*3/uL (ref 0.0–0.2)
Basos: 0 %
EOS (ABSOLUTE): 0.2 10*3/uL (ref 0.0–0.4)
Eos: 2 %
Hematocrit: 35.3 % (ref 34.0–46.6)
Hemoglobin: 11.3 g/dL (ref 11.1–15.9)
Immature Grans (Abs): 0 10*3/uL (ref 0.0–0.1)
Immature Granulocytes: 0 %
Lymphocytes Absolute: 3.2 10*3/uL — ABNORMAL HIGH (ref 0.7–3.1)
Lymphs: 37 %
MCH: 30.1 pg (ref 26.6–33.0)
MCHC: 32 g/dL (ref 31.5–35.7)
MCV: 94 fL (ref 79–97)
Monocytes Absolute: 0.9 10*3/uL (ref 0.1–0.9)
Monocytes: 11 %
Neutrophils Absolute: 4.4 10*3/uL (ref 1.4–7.0)
Neutrophils: 50 %
Platelets: 288 10*3/uL (ref 150–450)
RBC: 3.75 x10E6/uL — ABNORMAL LOW (ref 3.77–5.28)
RDW: 14.8 % (ref 11.7–15.4)
WBC: 8.8 10*3/uL (ref 3.4–10.8)

## 2024-01-25 LAB — LIPID PANEL
Chol/HDL Ratio: 3.8 ratio (ref 0.0–4.4)
Cholesterol, Total: 137 mg/dL (ref 100–199)
HDL: 36 mg/dL — ABNORMAL LOW (ref >39)
LDL Chol Calc (NIH): 70 mg/dL (ref 0–99)
Triglycerides: 182 mg/dL — ABNORMAL HIGH (ref 0–149)
VLDL Cholesterol Cal: 31 mg/dL (ref 5–40)

## 2024-01-27 ENCOUNTER — Ambulatory Visit: Payer: Self-pay | Admitting: Family

## 2024-01-27 ENCOUNTER — Telehealth: Payer: Self-pay | Admitting: Family Medicine

## 2024-01-27 NOTE — Telephone Encounter (Signed)
 Copied from CRM [id number]. Topic: General - Other >> Jan 27, 2024  4:05 PM Rachelle R wrote: Reason for CRM: Patient's daughter is returning call and would like to speak with Carolyn Cisco.  Dina Francisco can be reached at [phone]

## 2024-01-27 NOTE — Telephone Encounter (Signed)
 Patient aware and verbalized understanding.

## 2024-02-03 ENCOUNTER — Other Ambulatory Visit: Payer: Self-pay | Admitting: Family

## 2024-02-03 DIAGNOSIS — M810 Age-related osteoporosis without current pathological fracture: Secondary | ICD-10-CM

## 2024-02-18 ENCOUNTER — Ambulatory Visit (INDEPENDENT_AMBULATORY_CARE_PROVIDER_SITE_OTHER): Payer: Medicare Other

## 2024-02-18 VITALS — BP 125/64 | HR 82 | Ht 62.0 in | Wt 173.0 lb

## 2024-02-18 DIAGNOSIS — Z0000 Encounter for general adult medical examination without abnormal findings: Secondary | ICD-10-CM | POA: Diagnosis not present

## 2024-02-18 NOTE — Patient Instructions (Signed)
 Gabriella Ferguson , Thank you for taking time out of your busy schedule to complete your Annual Wellness Visit with me. I enjoyed our conversation and look forward to speaking with you again next year. I, as well as your care team,  appreciate your ongoing commitment to your health goals. Please review the following plan we discussed and let me know if I can assist you in the future. Your Game plan/ To Do List    Follow up Visits: Next Medicare AWV with our clinical staff: 02/18/24 at 10:40a.m.   Have you seen your provider in the last 6 months (3 months if uncontrolled diabetes)? Yes Next Office Visit with your provider: 05/01/24 at 3:25p.m.  Clinician Recommendations:  Aim for 30 minutes of exercise or brisk walking, 6-8 glasses of water, and 5 servings of fruits and vegetables each day.       This is a list of the screening recommended for you and due dates:  Health Maintenance  Topic Date Due   Eye exam for diabetics  10/08/2019   COVID-19 Vaccine (4 - 2024-25 season) 03/05/2025*   Flu Shot  03/27/2024   Hemoglobin A1C  07/26/2024   Complete foot exam   01/23/2025   Medicare Annual Wellness Visit  02/17/2025   DTaP/Tdap/Td vaccine (2 - Td or Tdap) 01/01/2027   Pneumococcal Vaccine for age over 70  Completed   Zoster (Shingles) Vaccine  Completed   Hepatitis B Vaccine  Aged Out   HPV Vaccine  Aged Out   Meningitis B Vaccine  Aged Out   DEXA scan (bone density measurement)  Discontinued  *Topic was postponed. The date shown is not the original due date.    Advanced directives: (Declined) Advance directive discussed with you today. Even though you declined this today, please call our office should you change your mind, and we can give you the proper paperwork for you to fill out. Advance Care Planning is important because it:  [x]  Makes sure you receive the medical care that is consistent with your values, goals, and preferences  [x]  It provides guidance to your family and loved ones and  reduces their decisional burden about whether or not they are making the right decisions based on your wishes.  Follow the link provided in your after visit summary or read over the paperwork we have mailed to you to help you started getting your Advance Directives in place. If you need assistance in completing these, please reach out to us  so that we can help you!  See attachments for Preventive Care and Fall Prevention Tips.

## 2024-02-18 NOTE — Progress Notes (Signed)
 Subjective:   Gabriella Ferguson is a 89 y.o. who presents for a Medicare Wellness preventive visit.  As a reminder, Annual Wellness Visits don't include a physical exam, and some assessments may be limited, especially if this visit is performed virtually. We may recommend an in-person follow-up visit with your provider if needed.  Visit Complete: Virtual I connected with  Gabriella Ferguson on 02/18/24 by a audio enabled telemedicine application and verified that I am speaking with the correct person using two identifiers.  Patient Location: [facility]  Provider Location: Home Office  I discussed the limitations of evaluation and management by telemedicine. The patient expressed understanding and agreed to proceed.  Vital Signs: Because this visit was a virtual/telehealth visit, some criteria may be missing or patient reported. Any vitals not documented were not able to be obtained and vitals that have been documented are patient reported.  VideoDeclined- This patient declined Librarian, academic. Therefore the visit was completed with audio only.  Persons Participating in Visit: Patient.  AWV Questionnaire: No: Patient Medicare AWV questionnaire was not completed prior to this visit.  Cardiac Risk Factors include: advanced age (>24men, >43 women);diabetes mellitus;dyslipidemia;hypertension     Objective:    Today's Vitals   02/18/24 1156  BP: 125/64  Pulse: 82  Weight: 173 lb (78.5 kg)  Height: 5' 2 (1.575 m)   Body mass index is 31.64 kg/m.     02/18/2024   11:42 AM 02/15/2023   11:20 AM 02/13/2022    2:17 PM 01/24/2021   10:40 AM 01/22/2020    9:28 AM 01/21/2019    9:46 AM  Advanced Directives  Does Patient Have a Medical Advance Directive? No Yes Yes No No No  Type of Special educational needs teacher of [place];Living will Healthcare Power of [place];Living will     Copy of Healthcare Power of Attorney in Chart?  No -  copy requested No - copy requested     Would patient like information on creating a medical advance directive?    No - Guardian declined No - Patient declined No - Patient declined      Data saved with a previous flowsheet row definition    Current Medications (verified) Outpatient Encounter Medications as of 02/18/2024  Medication Sig   acetaminophen  (TYLENOL ) 500 MG tablet Take 1 tablet (500 mg total) by mouth every 6 (six) hours as needed.   alendronate  (FOSAMAX ) 70 MG tablet TAKE ONE TABLET ONCE WEEKLY ON AN EMPTY STOMACH   aspirin EC 81 MG tablet Take 81 mg by mouth daily.   blood glucose meter kit and supplies Dispense based on patient and insurance preference. Use up to four times daily as directed. E11.9   cephALEXin  (KEFLEX ) 250 MG capsule Take 1 capsule (250 mg total) by mouth at bedtime.   furosemide  (LASIX ) 20 MG tablet Take 1 tablet (20 mg total) by mouth daily.   gabapentin  (NEURONTIN ) 300 MG capsule TAKE ONE CAPSULE TWICE DAILY   glucose blood (CONTOUR NEXT TEST) test strip CHECK BLOOD SUGAR UP TO 4 TIMES A DAY AS DIRECTED DxE11.9   Insulin  Pen Needle (GNP ULTICARE PEN NEEDLES) 32G X 4 MM MISC AS DIRECTED ONCE DAILY   LANTUS  SOLOSTAR 100 UNIT/ML Solostar Pen INJECT 18 UNITS AT BEDTIME   metFORMIN  (GLUCOPHAGE ) 500 MG tablet TAKE (1) TABLET TWICE A DAY WITH MEALS (BREAKFAST AND SUPPER)   Semaglutide  (RYBELSUS ) 14 MG TABS Take 1 tablet by mouth once daily   simvastatin  (ZOCOR )  20 MG tablet TAKE ONE TABLET DAILY   tolterodine  (DETROL  LA) 4 MG 24 hr capsule Take 1 capsule (4 mg total) by mouth daily.   TRUEplus Lancets 33G MISC Check BS up to 4 times daily as directed Dx E11.9   No facility-administered encounter medications on file as of 02/18/2024.    Allergies (verified) Semaglutide    History: Past Medical History:  Diagnosis Date   Breast cancer (HCC)    Diabetes mellitus without complication (HCC)    Type 2   Hip fracture (HCC)    Meningitis    Past Surgical  History:  Procedure Laterality Date   HIP CLOSED REDUCTION     Family History  Problem Relation Age of Onset   Diabetes Daughter    Stroke Mother    Stroke Father    Social History   Socioeconomic History   Marital status: Widowed    Spouse name: Not on file   Number of children: 2   Years of education: Not on file   Highest education level: 10th grade  Occupational History   Occupation: Retired  Tobacco Use   Smoking status: Never   Smokeless tobacco: Never  Vaping Use   Vaping status: Never Used  Substance and Sexual Activity   Alcohol use: No   Drug use: No   Sexual activity: Not Currently  Other Topics Concern   Not on file  Social History Narrative   Lives alone in senior apartments ([place]) on ground level   Daughter visits daily and helps with medication and diabetes.    Family members take her to appts and shopping.   Social Drivers of Corporate investment banker Strain: Low Risk  (02/18/2024)   Overall Financial Resource Strain (CARDIA)    Difficulty of Paying Living Expenses: Not hard at all  Food Insecurity: No Food Insecurity (02/18/2024)   Hunger Vital Sign    Worried About Running Out of Food in the Last Year: Never true    Ran Out of Food in the Last Year: Never true  Transportation Needs: No Transportation Needs (02/18/2024)   PRAPARE - Administrator, Civil Service (Medical): No    Lack of Transportation (Non-Medical): No  Physical Activity: Insufficiently Active (02/18/2024)   Exercise Vital Sign    Days of Exercise per Week: 3 days    Minutes of Exercise per Session: 10 min  Stress: No Stress Concern Present (02/18/2024)   [employer] of Occupational Health - Occupational Stress Questionnaire    Feeling of Stress: Not at all  Social Connections: Moderately Isolated (02/18/2024)   Social Connection and Isolation Panel    Frequency of Communication with Friends and Family: More than three times a week    Frequency of Social  Gatherings with Friends and Family: More than three times a week    Attends Religious Services: More than 4 times per year    Active Member of [employer] or Organizations: No    Attends Banker Meetings: Never    Marital Status: Widowed    Tobacco Counseling Counseling given: Yes    Clinical Intake:  Pre-visit preparation completed: Yes  Pain : No/denies pain     Nutritional Risks: None Diabetes: Yes CBG done?: No  Lab Results  Component Value Date   HGBA1C 7.2 (H) 01/24/2024   HGBA1C 6.8 (H) 05/09/2023   HGBA1C 7.2 (H) 11/05/2022     How often do you need to have someone help you when you read instructions, pamphlets,  or other written materials from your doctor or pharmacy?: 1 - Never  Interpreter Needed?: No  Information entered by :: alia t/cma   Activities of Daily Living     02/18/2024   11:39 AM  In your present state of health, do you have any difficulty performing the following activities:  Hearing? 0  Vision? 0  Difficulty concentrating or making decisions? 0  Walking or climbing stairs? 0  Comment no stairs  Dressing or bathing? 0  Doing errands, shopping? 1  Comment pt's daughter/or Microbiologist and eating ? N  Using the Toilet? N  In the past six months, have you accidently leaked urine? N  Do you have problems with loss of bowel control? N  Managing your Medications? N  Managing your Finances? N  Housekeeping or managing your Housekeeping? N    Patient Care Team: Lavell Bari LABOR, FNP as PCP - General (Family Medicine) Billee Mliss BIRCH, [facility] (Pharmacist) Vicci Mcardle, OD (Optometry)  I have updated your Care Teams any recent Medical Services you may have received from other providers in the past year.     Assessment:   This is a routine wellness examination for [place].  Hearing/Vision screen Hearing Screening - Comments:: Pt denies hearing dif Vision Screening - Comments:: Pt wear glasses denies vision dif/pt  goes [facility] [address] In [place], [place]/been over a year--suggest getting an eye appt soon   Goals Addressed             This Visit's Progress    Patient Stated       Be able to take care of herself/being able to walk       Depression Screen     02/18/2024   11:47 AM 01/24/2024    3:27 PM 09/20/2023    3:20 PM 05/09/2023   10:10 AM 02/15/2023   11:19 AM 07/02/2022    1:37 PM 03/12/2022    2:29 PM  PHQ 2/9 Scores  PHQ - 2 Score 0 0 0 0 0 0 0  PHQ- 9 Score 0 0 0   0     Fall Risk     02/18/2024   11:33 AM 05/09/2023   10:10 AM 02/15/2023   11:18 AM 03/12/2022    2:29 PM 02/13/2022    2:12 PM  Fall Risk   Falls in the past year? 1 0 0 0 1  Number falls in past yr: 0 0 0 0 1  Injury with Fall? 0 0 0 0 0  Risk for fall due to : Impaired balance/gait;Impaired mobility No Fall Risks No Fall Risks History of fall(s) History of fall(s);Impaired balance/gait;Orthopedic patient  Follow up  Falls evaluation completed Falls prevention discussed Falls evaluation completed  Education provided;Falls prevention discussed      Data saved with a previous flowsheet row definition    MEDICARE RISK AT HOME:  Medicare Risk at Home Any stairs in or around the home?: Yes If so, are there any without handrails?: Yes Home free of loose throw rugs in walkways, pet beds, electrical cords, etc?: Yes Adequate lighting in your home to reduce risk of falls?: Yes Life alert?: No Use of a cane, walker or w/c?: Yes (walker/cane prn) Grab bars in the bathroom?: Yes Shower chair or bench in shower?: Yes Elevated toilet seat or a handicapped toilet?: Yes  TIMED UP AND GO:  Was the test performed?  no  Cognitive Function: 6CIT completed        02/18/2024  11:49 AM 02/15/2023   11:20 AM 02/13/2022    2:18 PM 01/24/2021   10:47 AM 01/22/2020    9:30 AM  6CIT Screen  What Year? 4 points 0 points 0 points 0 points 0 points  What month? 3 points 0 points 0 points 0 points 0 points  What time? 0 points 0  points 0 points 0 points 0 points  Count back from 20 0 points 0 points 0 points 0 points 0 points  Months in reverse 0 points 2 points 2 points 0 points 0 points  Repeat phrase 4 points 4 points 8 points 10 points 4 points  Total Score 11 points 6 points 10 points 10 points 4 points    Immunizations Immunization History  Administered Date(s) Administered   Fluad Quad(high Dose 65+) 06/10/2020, 06/29/2021, 07/02/2022   Fluad Trivalent(High Dose 65+) 05/09/2023   Moderna Sars-Covid-2 Vaccination 10/26/2019, 11/23/2019, 07/19/2020   Pneumococcal Conjugate-13 12/31/2016   Pneumococcal Polysaccharide-23 04/09/2018   Tdap 12/31/2016   Zoster Recombinant(Shingrix) 02/02/2021, 10/02/2021    Screening Tests Health Maintenance  Topic Date Due   OPHTHALMOLOGY EXAM  10/08/2019   COVID-19 Vaccine (4 - 2024-25 season) 03/05/2025 (Originally 04/28/2023)   INFLUENZA VACCINE  03/27/2024   HEMOGLOBIN A1C  07/26/2024   FOOT EXAM  01/23/2025   Medicare Annual Wellness (AWV)  02/17/2025   DTaP/Tdap/Td (2 - Td or Tdap) 01/01/2027   Pneumococcal Vaccine: 50+ Years  Completed   Zoster Vaccines- Shingrix  Completed   Hepatitis B Vaccines  Aged Out   HPV VACCINES  Aged Out   Meningococcal B Vaccine  Aged Out   DEXA SCAN  Discontinued    Health Maintenance  Health Maintenance Due  Topic Date Due   OPHTHALMOLOGY EXAM  10/08/2019   Health Maintenance Items Addressed: See Nurse Notes at the end of this note  Additional Screening:  Vision Screening: Recommended annual ophthalmology exams for early detection of glaucoma and other disorders of the eye. Would you like a referral to an eye doctor? No    Dental Screening: Recommended annual dental exams for proper oral hygiene  Community Resource Referral / Chronic Care Management: CRR required this visit?  No   CCM required this visit?  No   Plan:    I have personally reviewed and noted the following in the patient's chart:   Medical and  social history Use of alcohol, tobacco or illicit drugs  Current medications and supplements including opioid prescriptions. Patient is not currently taking opioid prescriptions. Functional ability and status Nutritional status Physical activity Advanced directives List of other physicians Hospitalizations, surgeries, and ER visits in previous 12 months Vitals Screenings to include cognitive, depression, and falls Referrals and appointments  In addition, I have reviewed and discussed with patient certain preventive protocols, quality metrics, and best practice recommendations. A written personalized care plan for preventive services as well as general preventive health recommendations were provided to patient.   Ozie Ned, CMA   02/18/2024   After Visit Summary: (Declined) Due to this being a telephonic visit, with patients personalized plan was offered to patient but patient Declined AVS at this time   Notes: Please refer to Routing Comments.

## 2024-02-25 ENCOUNTER — Other Ambulatory Visit: Payer: Self-pay | Admitting: Family

## 2024-02-25 DIAGNOSIS — E1165 Type 2 diabetes mellitus with hyperglycemia: Secondary | ICD-10-CM

## 2024-02-25 DIAGNOSIS — R600 Localized edema: Secondary | ICD-10-CM

## 2024-02-25 DIAGNOSIS — E1169 Type 2 diabetes mellitus with other specified complication: Secondary | ICD-10-CM

## 2024-03-19 ENCOUNTER — Other Ambulatory Visit: Payer: Self-pay | Admitting: Family

## 2024-03-19 DIAGNOSIS — E1165 Type 2 diabetes mellitus with hyperglycemia: Secondary | ICD-10-CM

## 2024-03-19 DIAGNOSIS — G629 Polyneuropathy, unspecified: Secondary | ICD-10-CM

## 2024-04-17 ENCOUNTER — Other Ambulatory Visit: Payer: Self-pay | Admitting: Family

## 2024-04-17 DIAGNOSIS — E1165 Type 2 diabetes mellitus with hyperglycemia: Secondary | ICD-10-CM

## 2024-04-24 ENCOUNTER — Other Ambulatory Visit: Payer: Self-pay | Admitting: Family

## 2024-04-24 DIAGNOSIS — M810 Age-related osteoporosis without current pathological fracture: Secondary | ICD-10-CM

## 2024-05-01 ENCOUNTER — Ambulatory Visit: Admitting: Family

## 2024-05-01 ENCOUNTER — Encounter: Payer: Self-pay | Admitting: Family

## 2024-05-01 VITALS — BP 134/70 | HR 91 | Temp 98.0°F | Ht 62.0 in | Wt 164.6 lb

## 2024-05-01 DIAGNOSIS — I10 Essential (primary) hypertension: Secondary | ICD-10-CM

## 2024-05-01 DIAGNOSIS — E1169 Type 2 diabetes mellitus with other specified complication: Secondary | ICD-10-CM

## 2024-05-01 DIAGNOSIS — G629 Polyneuropathy, unspecified: Secondary | ICD-10-CM | POA: Diagnosis not present

## 2024-05-01 DIAGNOSIS — K5900 Constipation, unspecified: Secondary | ICD-10-CM

## 2024-05-01 DIAGNOSIS — E1165 Type 2 diabetes mellitus with hyperglycemia: Secondary | ICD-10-CM | POA: Diagnosis not present

## 2024-05-01 DIAGNOSIS — R600 Localized edema: Secondary | ICD-10-CM | POA: Diagnosis not present

## 2024-05-01 DIAGNOSIS — N390 Urinary tract infection, site not specified: Secondary | ICD-10-CM | POA: Diagnosis not present

## 2024-05-01 DIAGNOSIS — E785 Hyperlipidemia, unspecified: Secondary | ICD-10-CM

## 2024-05-01 DIAGNOSIS — Z0000 Encounter for general adult medical examination without abnormal findings: Secondary | ICD-10-CM

## 2024-05-01 DIAGNOSIS — R300 Dysuria: Secondary | ICD-10-CM

## 2024-05-01 DIAGNOSIS — N1831 Chronic kidney disease, stage 3a: Secondary | ICD-10-CM

## 2024-05-01 DIAGNOSIS — M8000XD Age-related osteoporosis with current pathological fracture, unspecified site, subsequent encounter for fracture with routine healing: Secondary | ICD-10-CM

## 2024-05-01 DIAGNOSIS — M150 Primary generalized (osteo)arthritis: Secondary | ICD-10-CM

## 2024-05-01 DIAGNOSIS — M810 Age-related osteoporosis without current pathological fracture: Secondary | ICD-10-CM

## 2024-05-01 LAB — URINALYSIS, COMPLETE
Bilirubin, UA: NEGATIVE
Glucose, UA: NEGATIVE
Nitrite, UA: NEGATIVE
RBC, UA: NEGATIVE
Specific Gravity, UA: 1.02 (ref 1.005–1.030)
Urobilinogen, Ur: 0.2 mg/dL (ref 0.2–1.0)
pH, UA: 5.5 (ref 5.0–7.5)

## 2024-05-01 LAB — MICROSCOPIC EXAMINATION
RBC, Urine: NONE SEEN /HPF (ref 0–2)
WBC, UA: >30 /HPF — AB (ref 0–5)
Yeast, UA: NONE SEEN

## 2024-05-01 LAB — BAYER DCA HB A1C WAIVED: HB A1C (BAYER DCA - WAIVED): 5.7 % — ABNORMAL HIGH (ref 4.8–5.6)

## 2024-05-01 MED ORDER — RYBELSUS 14 MG PO TABS: 1.0000 | ORAL_TABLET | Freq: Every day | ORAL | 1 refills | Status: DC

## 2024-05-01 MED ORDER — SIMVASTATIN 20 MG PO TABS: 20.0000 mg | ORAL_TABLET | Freq: Every day | ORAL | 0 refills | Status: DC

## 2024-05-01 MED ORDER — ALENDRONATE SODIUM 70 MG PO TABS: ORAL_TABLET | ORAL | 0 refills | Status: DC

## 2024-05-01 MED ORDER — ACETAMINOPHEN 500 MG PO TABS: 500.0000 mg | ORAL_TABLET | Freq: Four times a day (QID) | ORAL | 1 refills | Status: AC | PRN

## 2024-05-01 MED ORDER — TOLTERODINE TARTRATE ER 4 MG PO CP24: 4.0000 mg | ORAL_CAPSULE | Freq: Every day | ORAL | 1 refills | Status: DC

## 2024-05-01 MED ORDER — GABAPENTIN 300 MG PO CAPS: 300.0000 mg | ORAL_CAPSULE | Freq: Two times a day (BID) | ORAL | 0 refills | Status: DC

## 2024-05-01 MED ORDER — FUROSEMIDE 20 MG PO TABS: 20.0000 mg | ORAL_TABLET | Freq: Every day | ORAL | 1 refills | Status: DC

## 2024-05-01 MED ORDER — CEPHALEXIN 250 MG PO CAPS: 250.0000 mg | ORAL_CAPSULE | Freq: Every day | ORAL | 3 refills | Status: DC

## 2024-05-01 NOTE — Patient Instructions (Addendum)

## 2024-05-01 NOTE — Progress Notes (Signed)
 Subjective:    Patient ID: Gabriella Ferguson, female    DOB: 1929-08-07, 89 y.o.   MRN: [id number]  Chief Complaint  Patient presents with   3 MONTH FOLLOW UP   Pt presents to the office today for chronic follow up.   She has osteoporosis and taking Fosamax  weekly. Last Dexa scan was 10/22/19.    She has hx of recurrent UTI and use to takes Keflex  250 mg daily.  Pt has peripheral edema and takes lasix  20 mg daily.    She is followed by Dermatologists for skin cancer.  Hypertension This is a chronic problem. The current episode started more than 1 year ago. The problem has been resolved since onset. The problem is controlled. Associated symptoms include malaise/fatigue. Pertinent negatives include no blurred vision, peripheral edema or shortness of breath. Risk factors for coronary artery disease include dyslipidemia, diabetes mellitus, obesity and sedentary lifestyle. The current treatment provides moderate improvement.  Hyperlipidemia This is a chronic problem. The current episode started more than 1 year ago. The problem is controlled. Recent lipid tests were reviewed and are normal. Exacerbating diseases include obesity. Pertinent negatives include no shortness of breath. Current antihyperlipidemic treatment includes statins. The current treatment provides moderate improvement of lipids. Risk factors for coronary artery disease include dyslipidemia, diabetes mellitus, hypertension, a sedentary lifestyle and post-menopausal.  Diabetes She presents for her follow-up diabetic visit. She has type 2 diabetes mellitus. Associated symptoms include foot paresthesias. Pertinent negatives for diabetes include no blurred vision. Symptoms are stable. Diabetic complications include peripheral neuropathy. Risk factors for coronary artery disease include dyslipidemia, diabetes mellitus, hypertension, sedentary lifestyle and post-menopausal. She is following a generally healthy diet. Her overall blood  glucose range is 90-110 mg/dl. ( )  Constipation This is a chronic problem. The current episode started more than 1 year ago. The problem has been waxing and waning since onset. Her stool frequency is 1 time per day. She has tried diet changes for the symptoms. The treatment provided moderate relief.  Urinary Frequency  This is a chronic problem. The current episode started 1 to 4 weeks ago. The problem occurs intermittently. The problem has been unchanged. Associated symptoms include frequency and urgency. Pertinent negatives include no hesitancy. She has tried increased fluids (detrol ) for the symptoms. The treatment provided moderate relief.  Arthritis Presents for follow-up visit. She complains of pain and stiffness. The symptoms have been stable. Affected locations include the left knee and right knee. Her pain is at a severity of 6/10.      Review of Systems  Constitutional:  Positive for malaise/fatigue.  Eyes:  Negative for blurred vision.  Respiratory:  Negative for shortness of breath.   Genitourinary:  Positive for frequency and urgency. Negative for hesitancy.  Musculoskeletal:  Positive for stiffness.  All other systems reviewed and are negative.  Family History  Problem Relation Age of Onset   Diabetes Daughter    Stroke Mother    Stroke Father    Social History   Socioeconomic History   Marital status: Widowed    Spouse name: Not on file   Number of children: 2   Years of education: Not on file   Highest education level: 10th grade  Occupational History   Occupation: Retired  Tobacco Use   Smoking status: Never   Smokeless tobacco: Never  Vaping Use   Vaping status: Never Used  Substance and Sexual Activity   Alcohol use: No   Drug use: No   Sexual  activity: Not Currently  Other Topics Concern   Not on file  Social History Narrative   Lives alone in senior apartments ([place]) on ground level   Daughter visits daily and helps with medication and  diabetes.    Family members take her to appts and shopping.   Social Drivers of Corporate investment banker Strain: Low Risk  (02/18/2024)   Overall Financial Resource Strain (CARDIA)    Difficulty of Paying Living Expenses: Not hard at all  Food Insecurity: No Food Insecurity (02/18/2024)   Hunger Vital Sign    Worried About Running Out of Food in the Last Year: Never true    Ran Out of Food in the Last Year: Never true  Transportation Needs: No Transportation Needs (02/18/2024)   PRAPARE - Administrator, Civil Service (Medical): No    Lack of Transportation (Non-Medical): No  Physical Activity: Insufficiently Active (02/18/2024)   Exercise Vital Sign    Days of Exercise per Week: 3 days    Minutes of Exercise per Session: 10 min  Stress: No Stress Concern Present (02/18/2024)   [employer] of Occupational Health - Occupational Stress Questionnaire    Feeling of Stress: Not at all  Social Connections: Moderately Isolated (02/18/2024)   Social Connection and Isolation Panel    Frequency of Communication with Friends and Family: More than three times a week    Frequency of Social Gatherings with Friends and Family: More than three times a week    Attends Religious Services: More than 4 times per year    Active Member of [employer] or Organizations: No    Attends Banker Meetings: Never    Marital Status: Widowed       Objective:   Physical Exam Vitals reviewed.  Constitutional:      General: She is not in acute distress.    Appearance: She is well-developed. She is obese.  HENT:     Head: Normocephalic and atraumatic.     Right Ear: Tympanic membrane normal.     Left Ear: Tympanic membrane normal.  Eyes:     Pupils: Pupils are equal, round, and reactive to light.  Neck:     Thyroid : No thyromegaly.  Cardiovascular:     Rate and Rhythm: Normal rate and regular rhythm.     Heart sounds: Normal heart sounds. No murmur heard. Pulmonary:     Effort:  Pulmonary effort is normal. No respiratory distress.     Breath sounds: Normal breath sounds. No wheezing.  Abdominal:     General: Bowel sounds are normal. There is no distension.     Palpations: Abdomen is soft.     Tenderness: There is no abdominal tenderness.  Musculoskeletal:        General: No tenderness. Normal range of motion.     Cervical back: Normal range of motion and neck supple.     Right lower leg: No edema.     Left lower leg: No edema.  Skin:    General: Skin is warm and dry.  Neurological:     Mental Status: She is alert and oriented to person, place, and time.     Cranial Nerves: No cranial nerve deficit.     Motor: Weakness (using cane to walk) present.     Gait: Gait abnormal.     Deep Tendon Reflexes: Reflexes are normal and symmetric.  Psychiatric:        Behavior: Behavior normal.  Thought Content: Thought content normal.        Judgment: Judgment normal.                                                                              BP 134/70   Pulse 91   Temp 98 F (36.7 C)   Ht 5' 2 (1.575 m)   Wt 164 lb 9.6 oz (74.7 kg)   SpO2 97%   BMI 30.11 kg/m   Assessment & Plan:  Gabriella Ferguson comes in today with chief complaint of 3 MONTH FOLLOW UP   Diagnosis and orders addressed:  1. Dysuria (Primary) - Urinalysis, Complete - CMP14+EGFR - CBC with Differential/Platelet  2. Primary osteoarthritis involving multiple joints - acetaminophen  (TYLENOL ) 500 MG tablet; Take 1 tablet (500 mg total) by mouth every 6 (six) hours as needed.  Dispense: 90 tablet; Refill: 1 - CMP14+EGFR - CBC with Differential/Platelet  3. Peripheral edema - furosemide  (LASIX ) 20 MG tablet; Take 1 tablet (20 mg total) by mouth daily.  Dispense: 90 tablet; Refill: 1 - CMP14+EGFR - CBC with Differential/Platelet  4. Neuropathy - gabapentin  (NEURONTIN ) 300 MG capsule; Take 1 capsule (300 mg total) by mouth 2 (two) times daily.  Dispense: 180 capsule; Refill:  0 - CMP14+EGFR - CBC with Differential/Platelet  5. Type 2 diabetes mellitus with hyperglycemia, without long-term current use of insulin  (HCC)  - Semaglutide  (RYBELSUS ) 14 MG TABS; Take 1 tablet (14 mg total) by mouth daily.  Dispense: 90 tablet; Refill: 1 - Bayer DCA Hb A1c Waived - CMP14+EGFR - CBC with Differential/Platelet  6. Essential hypertension - CMP14+EGFR - CBC with Differential/Platelet  7. Hyperlipidemia associated with type 2 diabetes mellitus (HCC) - simvastatin  (ZOCOR ) 20 MG tablet; Take 1 tablet (20 mg total) by mouth daily.  Dispense: 90 tablet; Refill: 0 - CMP14+EGFR - CBC with Differential/Platelet  8. Osteoporosis with current pathological fracture with routine healing, unspecified osteoporosis type, subsequent encounter - CMP14+EGFR - CBC with Differential/Platelet  9. Stage 3a chronic kidney disease (HCC)  - CMP14+EGFR - CBC with Differential/Platelet  10. Constipation, unspecified constipation type - CMP14+EGFR - CBC with Differential/Platelet  11. Recurrent UTI - cephALEXin  (KEFLEX ) 250 MG capsule; Take 1 capsule (250 mg total) by mouth at bedtime.  Dispense: 90 capsule; Refill: 3 - CMP14+EGFR - CBC with Differential/Platelet  12. Annual physical exam - cephALEXin  (KEFLEX ) 250 MG capsule; Take 1 capsule (250 mg total) by mouth at bedtime.  Dispense: 90 capsule; Refill: 3 - CMP14+EGFR - CBC with Differential/Platelet  13. Osteoporosis, unspecified osteoporosis type, unspecified pathological fracture presence - alendronate  (FOSAMAX ) 70 MG tablet; TAKE ONE TABLET ONCE WEEKLY ON AN EMPTY STOMACH  Dispense: 12 tablet; Refill: 0 - CMP14+EGFR - CBC with Differential/Platelet  Labs pending Continue Keflex  250 mg daily force fluids.  Continue current medications  Health Maintenance reviewed Diet and exercise encouraged  Follow up plan: 4 months    Bari Learn, FNP

## 2024-05-02 LAB — CMP14+EGFR
ALT: 6 IU/L (ref 0–32)
AST: 14 IU/L (ref 0–40)
Albumin: 3.6 g/dL (ref 3.6–4.6)
Alkaline Phosphatase: 43 IU/L — ABNORMAL LOW (ref 44–121)
BUN/Creatinine Ratio: 16 (ref 12–28)
BUN: 21 mg/dL (ref 10–36)
Bilirubin Total: 0.3 mg/dL (ref 0.0–1.2)
CO2: 24 mmol/L (ref 20–29)
Calcium: 9.3 mg/dL (ref 8.7–10.3)
Chloride: 101 mmol/L (ref 96–106)
Creatinine, Ser: 1.32 mg/dL — ABNORMAL HIGH (ref 0.57–1.00)
Globulin, Total: 2.7 g/dL (ref 1.5–4.5)
Glucose: 102 mg/dL — ABNORMAL HIGH (ref 70–99)
Potassium: 4.8 mmol/L (ref 3.5–5.2)
Sodium: 139 mmol/L (ref 134–144)
Total Protein: 6.3 g/dL (ref 6.0–8.5)
eGFR: 38 mL/min/1.73 — ABNORMAL LOW (ref >59)

## 2024-05-02 LAB — CBC WITH DIFFERENTIAL/PLATELET
Basophils Absolute: 0 x10E3/uL (ref 0.0–0.2)
Basos: 0 %
EOS (ABSOLUTE): 0.1 x10E3/uL (ref 0.0–0.4)
Eos: 1 %
Hematocrit: 36.7 % (ref 34.0–46.6)
Hemoglobin: 11.8 g/dL (ref 11.1–15.9)
Immature Grans (Abs): 0 x10E3/uL (ref 0.0–0.1)
Immature Granulocytes: 0 %
Lymphocytes Absolute: 4 x10E3/uL — ABNORMAL HIGH (ref 0.7–3.1)
Lymphs: 47 %
MCH: 31.8 pg (ref 26.6–33.0)
MCHC: 32.2 g/dL (ref 31.5–35.7)
MCV: 99 fL — ABNORMAL HIGH (ref 79–97)
Monocytes Absolute: 0.8 x10E3/uL (ref 0.1–0.9)
Monocytes: 9 %
Neutrophils Absolute: 3.7 x10E3/uL (ref 1.4–7.0)
Neutrophils: 43 %
Platelets: 258 x10E3/uL (ref 150–450)
RBC: 3.71 x10E6/uL — ABNORMAL LOW (ref 3.77–5.28)
RDW: 14.5 % (ref 11.7–15.4)
WBC: 8.7 x10E3/uL (ref 3.4–10.8)

## 2024-05-03 LAB — URINE CULTURE

## 2024-05-04 ENCOUNTER — Ambulatory Visit: Payer: Self-pay | Admitting: Family

## 2024-05-25 ENCOUNTER — Other Ambulatory Visit: Payer: Self-pay | Admitting: Family

## 2024-05-25 DIAGNOSIS — E1165 Type 2 diabetes mellitus with hyperglycemia: Secondary | ICD-10-CM

## 2024-06-05 ENCOUNTER — Other Ambulatory Visit: Payer: Self-pay | Admitting: Family

## 2024-06-05 DIAGNOSIS — E1165 Type 2 diabetes mellitus with hyperglycemia: Secondary | ICD-10-CM

## 2024-08-24 ENCOUNTER — Other Ambulatory Visit: Payer: Self-pay | Admitting: Family

## 2024-08-24 DIAGNOSIS — E1169 Type 2 diabetes mellitus with other specified complication: Secondary | ICD-10-CM

## 2024-08-31 DIAGNOSIS — E1165 Type 2 diabetes mellitus with hyperglycemia: Secondary | ICD-10-CM

## 2024-09-11 ENCOUNTER — Ambulatory Visit: Payer: Self-pay | Admitting: Family

## 2024-09-17 ENCOUNTER — Encounter: Payer: Self-pay | Admitting: Family

## 2024-09-17 ENCOUNTER — Other Ambulatory Visit: Payer: Self-pay | Admitting: Family

## 2024-09-17 ENCOUNTER — Ambulatory Visit: Admitting: Family

## 2024-09-17 VITALS — BP 136/68 | HR 75 | Temp 98.0°F | Ht 62.0 in | Wt 175.4 lb

## 2024-09-17 DIAGNOSIS — M150 Primary generalized (osteo)arthritis: Secondary | ICD-10-CM | POA: Diagnosis not present

## 2024-09-17 DIAGNOSIS — K5900 Constipation, unspecified: Secondary | ICD-10-CM | POA: Diagnosis not present

## 2024-09-17 DIAGNOSIS — R600 Localized edema: Secondary | ICD-10-CM | POA: Diagnosis not present

## 2024-09-17 DIAGNOSIS — E1165 Type 2 diabetes mellitus with hyperglycemia: Secondary | ICD-10-CM

## 2024-09-17 DIAGNOSIS — E1149 Type 2 diabetes mellitus with other diabetic neurological complication: Secondary | ICD-10-CM | POA: Diagnosis not present

## 2024-09-17 DIAGNOSIS — E785 Hyperlipidemia, unspecified: Secondary | ICD-10-CM | POA: Diagnosis not present

## 2024-09-17 DIAGNOSIS — H6123 Impacted cerumen, bilateral: Secondary | ICD-10-CM | POA: Diagnosis not present

## 2024-09-17 DIAGNOSIS — L989 Disorder of the skin and subcutaneous tissue, unspecified: Secondary | ICD-10-CM | POA: Diagnosis not present

## 2024-09-17 DIAGNOSIS — M810 Age-related osteoporosis without current pathological fracture: Secondary | ICD-10-CM

## 2024-09-17 DIAGNOSIS — I10 Essential (primary) hypertension: Secondary | ICD-10-CM | POA: Diagnosis not present

## 2024-09-17 DIAGNOSIS — N1831 Chronic kidney disease, stage 3a: Secondary | ICD-10-CM | POA: Diagnosis not present

## 2024-09-17 DIAGNOSIS — M8000XD Age-related osteoporosis with current pathological fracture, unspecified site, subsequent encounter for fracture with routine healing: Secondary | ICD-10-CM

## 2024-09-17 DIAGNOSIS — N390 Urinary tract infection, site not specified: Secondary | ICD-10-CM | POA: Diagnosis not present

## 2024-09-17 DIAGNOSIS — G629 Polyneuropathy, unspecified: Secondary | ICD-10-CM

## 2024-09-17 DIAGNOSIS — E1169 Type 2 diabetes mellitus with other specified complication: Secondary | ICD-10-CM | POA: Diagnosis not present

## 2024-09-17 MED ORDER — TOLTERODINE TARTRATE ER 4 MG PO CP24: 4.0000 mg | ORAL_CAPSULE | Freq: Every day | ORAL | 1 refills | Status: AC

## 2024-09-17 MED ORDER — FUROSEMIDE 20 MG PO TABS: 20.0000 mg | ORAL_TABLET | Freq: Every day | ORAL | 3 refills | Status: AC

## 2024-09-17 MED ORDER — SIMVASTATIN 20 MG PO TABS: 20.0000 mg | ORAL_TABLET | Freq: Every day | ORAL | 0 refills | Status: AC

## 2024-09-17 MED ORDER — GABAPENTIN 300 MG PO CAPS: 300.0000 mg | ORAL_CAPSULE | Freq: Two times a day (BID) | ORAL | 0 refills | Status: AC

## 2024-09-17 MED ORDER — CEPHALEXIN 250 MG PO CAPS: 250.0000 mg | ORAL_CAPSULE | Freq: Every day | ORAL | 3 refills | Status: AC

## 2024-09-17 MED ORDER — RYBELSUS 14 MG PO TABS: 1.0000 | ORAL_TABLET | Freq: Every day | ORAL | 1 refills | Status: AC

## 2024-09-17 MED ORDER — METFORMIN HCL 500 MG PO TABS: ORAL_TABLET | ORAL | 1 refills | Status: AC

## 2024-09-17 MED ORDER — ALENDRONATE SODIUM 70 MG PO TABS: ORAL_TABLET | ORAL | 0 refills | Status: AC

## 2024-09-17 NOTE — Patient Instructions (Signed)
 Health Maintenance After Age 89 After age 27, you are at a higher risk for certain long-term diseases and infections as well as injuries from falls. Falls are a major cause of broken bones and head injuries in people who are older than age 73. Getting regular preventive care can help to keep you healthy and well. Preventive care includes getting regular testing and making lifestyle changes as recommended by your health care provider. Talk with your health care provider about: Which screenings and tests you should have. A screening is a test that checks for a disease when you have no symptoms. A diet and exercise plan that is right for you. What should I know about screenings and tests to prevent falls? Screening and testing are the best ways to find a health problem early. Early diagnosis and treatment give you the best chance of managing medical conditions that are common after age 90. Certain conditions and lifestyle choices may make you more likely to have a fall. Your health care provider may recommend: Regular vision checks. Poor vision and conditions such as cataracts can make you more likely to have a fall. If you wear glasses, make sure to get your prescription updated if your vision changes. Medicine review. Work with your health care provider to regularly review all of the medicines you are taking, including over-the-counter medicines. Ask your health care provider about any side effects that may make you more likely to have a fall. Tell your health care provider if any medicines that you take make you feel dizzy or sleepy. Strength and balance checks. Your health care provider may recommend certain tests to check your strength and balance while standing, walking, or changing positions. Foot health exam. Foot pain and numbness, as well as not wearing proper footwear, can make you more likely to have a fall. Screenings, including: Osteoporosis screening. Osteoporosis is a condition that causes  the bones to get weaker and break more easily. Blood pressure screening. Blood pressure changes and medicines to control blood pressure can make you feel dizzy. Depression screening. You may be more likely to have a fall if you have a fear of falling, feel depressed, or feel unable to do activities that you used to do. Alcohol  use screening. Using too much alcohol  can affect your balance and may make you more likely to have a fall. Follow these instructions at home: Lifestyle Do not drink alcohol  if: Your health care provider tells you not to drink. If you drink alcohol : Limit how much you have to: 0-1 drink a day for women. 0-2 drinks a day for men. Know how much alcohol  is in your drink. In the [place] one drink equals one 12 oz bottle of beer (355 mL), one 5 oz glass of wine (148 mL), or one 1 oz glass of hard liquor (44 mL). Do not use any products that contain nicotine or tobacco. These products include cigarettes, chewing tobacco, and vaping devices, such as e-cigarettes. If you need help quitting, ask your health care provider. Activity  Follow a regular exercise program to stay fit. This will help you maintain your balance. Ask your health care provider what types of exercise are appropriate for you. If you need a cane or walker, use it as recommended by your health care provider. Wear supportive shoes that have nonskid soles. Safety  Remove any tripping hazards, such as rugs, cords, and clutter. Install safety equipment such as grab bars in bathrooms and safety rails on stairs. Keep rooms and walkways  well-lit. General instructions Talk with your health care provider about your risks for falling. Tell your health care provider if: You fall. Be sure to tell your health care provider about all falls, even ones that seem minor. You feel dizzy, tiredness (fatigue), or off-balance. Take over-the-counter and prescription medicines only as told by your health care provider. These include  supplements. Eat a healthy diet and maintain a healthy weight. A healthy diet includes low-fat dairy products, low-fat (lean) meats, and fiber from whole grains, beans, and lots of fruits and vegetables. Stay current with your vaccines. Schedule regular health, dental, and eye exams. Summary Having a healthy lifestyle and getting preventive care can help to protect your health and wellness after age 15. Screening and testing are the best way to find a health problem early and help you avoid having a fall. Early diagnosis and treatment give you the best chance for managing medical conditions that are more common for people who are older than age 42. Falls are a major cause of broken bones and head injuries in people who are older than age 64. Take precautions to prevent a fall at home. Work with your health care provider to learn what changes you can make to improve your health and wellness and to prevent falls. This information is not intended to replace advice given to you by your health care provider. Make sure you discuss any questions you have with your health care provider. Document Revised: 01/02/2021 Document Reviewed: 01/02/2021 Elsevier Patient Education  2024 [employer].

## 2024-09-17 NOTE — Progress Notes (Signed)
 "  Subjective:    Patient ID: Gabriella Ferguson, female    DOB: 06-26-1929, 89 y.o.   MRN: [id number]  Chief Complaint  Patient presents with   Medical Management of Chronic Issues   Pt presents to the office today for chronic follow up.   She has osteoporosis and taking Fosamax  weekly. Last Dexa scan was 10/22/19.    She has hx of recurrent UTI and use to takes Keflex  250 mg daily.  Pt has peripheral edema and takes lasix  20 mg every other day.    She is followed by Dermatologists for skin cancer. Complaining of skin lesion on left upper arm that will not heal and has been there several months.  Hypertension This is a chronic problem. The current episode started more than 1 year ago. The problem has been resolved since onset. The problem is controlled. Associated symptoms include malaise/fatigue. Pertinent negatives include no blurred vision, peripheral edema or shortness of breath. Risk factors for coronary artery disease include dyslipidemia, diabetes mellitus, obesity, sedentary lifestyle and post-menopausal state. The current treatment provides moderate improvement.  Hyperlipidemia This is a chronic problem. The current episode started more than 1 year ago. The problem is controlled. Recent lipid tests were reviewed and are normal. Exacerbating diseases include obesity. Pertinent negatives include no shortness of breath. Current antihyperlipidemic treatment includes statins. The current treatment provides moderate improvement of lipids. Risk factors for coronary artery disease include dyslipidemia, diabetes mellitus, hypertension, a sedentary lifestyle, post-menopausal and obesity.  Diabetes She presents for her follow-up diabetic visit. She has type 2 diabetes mellitus. Associated symptoms include foot paresthesias. Pertinent negatives for diabetes include no blurred vision. Symptoms are stable. Diabetic complications include peripheral neuropathy. Risk factors for coronary artery disease  include dyslipidemia, diabetes mellitus, hypertension, sedentary lifestyle, post-menopausal and obesity. She is following a generally healthy diet. Her overall blood glucose range is 90-110 mg/dl. ( )  Constipation This is a chronic problem. The current episode started more than 1 year ago. The problem has been waxing and waning since onset. Her stool frequency is 1 time per day. She has tried diet changes for the symptoms. The treatment provided moderate relief.  Urinary Frequency  This is a chronic problem. The current episode started 1 to 4 weeks ago. The problem occurs intermittently. The problem has been unchanged. Associated symptoms include frequency and urgency. Pertinent negatives include no hesitancy. She has tried increased fluids (detrol ) for the symptoms. The treatment provided moderate relief.  Arthritis Presents for follow-up visit. She complains of pain and stiffness. The symptoms have been stable. Affected locations include the left knee and right knee. Her pain is at a severity of 8/10.      Review of Systems  Constitutional:  Positive for malaise/fatigue.  Eyes:  Negative for blurred vision.  Respiratory:  Negative for shortness of breath.   Genitourinary:  Positive for frequency and urgency. Negative for hesitancy.  Musculoskeletal:  Positive for stiffness.  All other systems reviewed and are negative.  Family History  Problem Relation Age of Onset   Diabetes Daughter    Stroke Mother    Stroke Father    Social History   Socioeconomic History   Marital status: Widowed    Spouse name: Not on file   Number of children: 2   Years of education: Not on file   Highest education level: 10th grade  Occupational History   Occupation: Retired  Tobacco Use   Smoking status: Never   Smokeless tobacco: Never  Vaping Use   Vaping status: Never Used  Substance and Sexual Activity   Alcohol use: No   Drug use: No   Sexual activity: Not Currently  Other Topics Concern    Not on file  Social History Narrative   Lives alone in senior apartments ([place]) on ground level   Daughter visits daily and helps with medication and diabetes.    Family members take her to appts and shopping.   Social Drivers of Health   Tobacco Use: Low Risk (09/17/2024)   Patient History    Smoking Tobacco Use: Never    Smokeless Tobacco Use: Never    Passive Exposure: Not on file  Financial Resource Strain: Low Risk (02/18/2024)   Overall Financial Resource Strain (CARDIA)    Difficulty of Paying Living Expenses: Not hard at all  Food Insecurity: No Food Insecurity (02/18/2024)   Epic    Worried About Radiation Protection Practitioner of Food in the Last Year: Never true    Ran Out of Food in the Last Year: Never true  Transportation Needs: No Transportation Needs (02/18/2024)   Epic    Lack of Transportation (Medical): No    Lack of Transportation (Non-Medical): No  Physical Activity: Insufficiently Active (02/18/2024)   Exercise Vital Sign    Days of Exercise per Week: 3 days    Minutes of Exercise per Session: 10 min  Stress: No Stress Concern Present (02/18/2024)   [employer] of Occupational Health - Occupational Stress Questionnaire    Feeling of Stress: Not at all  Social Connections: Moderately Isolated (02/18/2024)   Social Connection and Isolation Panel    Frequency of Communication with Friends and Family: More than three times a week    Frequency of Social Gatherings with Friends and Family: More than three times a week    Attends Religious Services: More than 4 times per year    Active Member of [employer] or Organizations: No    Attends Banker Meetings: Never    Marital Status: Widowed  Depression (PHQ2-9): Low Risk (05/01/2024)   Depression (PHQ2-9)    PHQ-2 Score: 2  Alcohol Screen: Low Risk (02/18/2024)   Alcohol Screen    Last Alcohol Screening Score (AUDIT): 0  Housing: Unknown (02/18/2024)   Epic    Unable to Pay for Housing in the Last Year: No    Number  of Times Moved in the Last Year: Not on file    Homeless in the Last Year: No  Utilities: Not At Risk (02/18/2024)   Epic    Threatened with loss of utilities: No  Health Literacy: Adequate Health Literacy (02/18/2024)   B1300 Health Literacy    Frequency of need for help with medical instructions: Never       Objective:   Physical Exam Vitals reviewed.  Constitutional:      General: She is not in acute distress.    Appearance: She is well-developed. She is obese.  HENT:     Head: Normocephalic and atraumatic.     Right Ear: There is impacted cerumen.     Left Ear: There is impacted cerumen.  Eyes:     Pupils: Pupils are equal, round, and reactive to light.  Neck:     Thyroid : No thyromegaly.  Cardiovascular:     Rate and Rhythm: Normal rate and regular rhythm.     Heart sounds: Normal heart sounds. No murmur heard. Pulmonary:     Effort: Pulmonary effort is normal. No respiratory distress.  Breath sounds: Normal breath sounds. No wheezing.  Abdominal:     General: Bowel sounds are normal. There is no distension.     Palpations: Abdomen is soft.     Tenderness: There is no abdominal tenderness.  Musculoskeletal:        General: No tenderness. Normal range of motion.     Cervical back: Normal range of motion and neck supple.     Right lower leg: No edema.     Left lower leg: No edema.  Skin:    General: Skin is warm and dry.     Findings: Lesion present.     Comments: Scabbed lesion on left upper arm, approx 3.5X1.5 cm  Neurological:     Mental Status: She is alert and oriented to person, place, and time.     Cranial Nerves: No cranial nerve deficit.     Motor: Weakness (using cane to walk) present.     Gait: Gait abnormal.     Deep Tendon Reflexes: Reflexes are normal and symmetric.  Psychiatric:        Behavior: Behavior normal.        Thought Content: Thought content normal.        Judgment: Judgment normal.        Bilateral ears washed with warm water and  peroxide. Pt tolerated well. TM impacted  bilaterally. Will stop and she will use Debrox drops OTC and will try next visit.                                                                        BP (!) 170/63   Pulse 75   Temp 98 F (36.7 C) (Oral)   Ht 5' 2 (1.575 m)   Wt 175 lb 6.4 oz (79.6 kg)   SpO2 98%   BMI 32.08 kg/m   Assessment & Plan:  Gabriella Ferguson comes in today with chief complaint of Medical Management of Chronic Issues   Diagnosis and orders addressed:  1. Hyperlipidemia associated with type 2 diabetes mellitus (HCC) - Bayer DCA Hb A1c Waived - simvastatin  (ZOCOR ) 20 MG tablet; Take 1 tablet (20 mg total) by mouth daily.  Dispense: 90 tablet; Refill: 0  2. Stage 3a chronic kidney disease (HCC) - Bayer DCA Hb A1c Waived  3. Constipation, unspecified constipation type - Bayer DCA Hb A1c Waived  4. Type 2 diabetes mellitus with other specified complication, without long-term current use of insulin  (HCC) (Primary) - CMP14+EGFR - Bayer DCA Hb A1c Waived - Semaglutide  (RYBELSUS ) 14 MG TABS; Take 1 tablet (14 mg total) by mouth daily.  Dispense: 90 tablet; Refill: 1 - metFORMIN  (GLUCOPHAGE ) 500 MG tablet; TAKE 1 TABLET TWICE A DAY WITH MEALS (BREAKFAST AND SUPPER)  Dispense: 180 tablet; Refill: 1  5. Essential hypertension  - Bayer DCA Hb A1c Waived  6. Osteoporosis with current pathological fracture with routine healing, unspecified osteoporosis type, subsequent encounter - Bayer DCA Hb A1c Waived  7. Primary osteoarthritis involving multiple joints  - Bayer DCA Hb A1c Waived  8. Bilateral impacted cerumen  9. Recurrent UTI  - cephALEXin  (KEFLEX ) 250 MG capsule; Take 1 capsule (250 mg total) by mouth at bedtime.  Dispense: 90 capsule; Refill: 3  10. Osteoporosis,  unspecified osteoporosis type, unspecified pathological fracture presence - alendronate  (FOSAMAX ) 70 MG tablet; TAKE ONE TABLET ONCE WEEKLY ON AN EMPTY STOMACH  Dispense: 12 tablet; Refill:  0  11. Peripheral edema  - furosemide  (LASIX ) 20 MG tablet; Take 1 tablet (20 mg total) by mouth daily. Every other day  Dispense: 45 tablet; Refill: 3  12. Other diabetic neurological complication associated with type 2 diabetes mellitus (HCC) - gabapentin  (NEURONTIN ) 300 MG capsule; Take 1 capsule (300 mg total) by mouth 2 (two) times daily.  Dispense: 180 capsule; Refill: 0  13. Skin lesion - Ambulatory referral to Dermatology  Labs pending Continue Keflex  250 mg daily force fluids.  Referral to Derm pending  Pt will use Debrox drops OTC Continue current medications  Health Maintenance reviewed Diet and exercise encouraged  Follow up plan: 4 months    Bari Learn, FNP   "

## 2024-09-18 ENCOUNTER — Ambulatory Visit: Payer: Self-pay | Admitting: Family

## 2024-09-18 LAB — CMP14+EGFR
ALT: 9 IU/L (ref 0–32)
AST: 14 IU/L (ref 0–40)
Albumin: 3.9 g/dL (ref 3.6–4.6)
Alkaline Phosphatase: 59 IU/L (ref 48–129)
BUN/Creatinine Ratio: 15 (ref 12–28)
BUN: 17 mg/dL (ref 10–36)
Bilirubin Total: 0.2 mg/dL (ref 0.0–1.2)
CO2: 26 mmol/L (ref 20–29)
Calcium: 8.9 mg/dL (ref 8.7–10.3)
Chloride: 103 mmol/L (ref 96–106)
Creatinine, Ser: 1.17 mg/dL — ABNORMAL HIGH (ref 0.57–1.00)
Globulin, Total: 2.7 g/dL (ref 1.5–4.5)
Glucose: 87 mg/dL (ref 70–99)
Potassium: 4.9 mmol/L (ref 3.5–5.2)
Sodium: 140 mmol/L (ref 134–144)
Total Protein: 6.6 g/dL (ref 6.0–8.5)
eGFR: 44 mL/min/1.73 — ABNORMAL LOW

## 2024-09-18 LAB — BAYER DCA HB A1C WAIVED: HB A1C (BAYER DCA - WAIVED): 6.1 % — ABNORMAL HIGH (ref 4.8–5.6)

## 2025-01-19 ENCOUNTER — Ambulatory Visit: Admitting: Family

## 2025-02-18 ENCOUNTER — Ambulatory Visit: Payer: Self-pay
